# Patient Record
Sex: Female | Born: 1940 | Race: White | Hispanic: No | Marital: Married | Smoking: Never smoker
Health system: Southern US, Community
[De-identification: ages and names within clinical notes are randomized; demographics above are authoritative.]

## PROBLEM LIST (undated history)

## (undated) DIAGNOSIS — J45909 Unspecified asthma, uncomplicated: Secondary | ICD-10-CM

## (undated) DIAGNOSIS — I472 Ventricular tachycardia: Secondary | ICD-10-CM

## (undated) DIAGNOSIS — E039 Hypothyroidism, unspecified: Secondary | ICD-10-CM

## (undated) DIAGNOSIS — K219 Gastro-esophageal reflux disease without esophagitis: Secondary | ICD-10-CM

## (undated) DIAGNOSIS — I499 Cardiac arrhythmia, unspecified: Secondary | ICD-10-CM

## (undated) DIAGNOSIS — M2010 Hallux valgus (acquired), unspecified foot: Secondary | ICD-10-CM

## (undated) DIAGNOSIS — I255 Ischemic cardiomyopathy: Secondary | ICD-10-CM

## (undated) DIAGNOSIS — R918 Other nonspecific abnormal finding of lung field: Secondary | ICD-10-CM

## (undated) DIAGNOSIS — I5181 Takotsubo syndrome: Secondary | ICD-10-CM

## (undated) DIAGNOSIS — D649 Anemia, unspecified: Secondary | ICD-10-CM

## (undated) DIAGNOSIS — I219 Acute myocardial infarction, unspecified: Secondary | ICD-10-CM

## (undated) DIAGNOSIS — I10 Essential (primary) hypertension: Secondary | ICD-10-CM

## (undated) DIAGNOSIS — I201 Angina pectoris with documented spasm: Secondary | ICD-10-CM

## (undated) DIAGNOSIS — M1990 Unspecified osteoarthritis, unspecified site: Secondary | ICD-10-CM

## (undated) DIAGNOSIS — I82439 Acute embolism and thrombosis of unspecified popliteal vein: Secondary | ICD-10-CM

## (undated) DIAGNOSIS — I2510 Atherosclerotic heart disease of native coronary artery without angina pectoris: Secondary | ICD-10-CM

## (undated) DIAGNOSIS — E785 Hyperlipidemia, unspecified: Secondary | ICD-10-CM

## (undated) DIAGNOSIS — F32A Depression, unspecified: Secondary | ICD-10-CM

## (undated) DIAGNOSIS — M542 Cervicalgia: Secondary | ICD-10-CM

## (undated) DIAGNOSIS — I2699 Other pulmonary embolism without acute cor pulmonale: Secondary | ICD-10-CM

## (undated) DIAGNOSIS — F419 Anxiety disorder, unspecified: Secondary | ICD-10-CM

## (undated) DIAGNOSIS — R053 Chronic cough: Secondary | ICD-10-CM

## (undated) DIAGNOSIS — F329 Major depressive disorder, single episode, unspecified: Secondary | ICD-10-CM

## (undated) DIAGNOSIS — I4729 Other ventricular tachycardia: Secondary | ICD-10-CM

## (undated) DIAGNOSIS — C50919 Malignant neoplasm of unspecified site of unspecified female breast: Secondary | ICD-10-CM

## (undated) HISTORY — DX: Ventricular tachycardia: I47.2

## (undated) HISTORY — PX: TONSILLECTOMY: SUR1361

## (undated) HISTORY — DX: Hyperlipidemia, unspecified: E78.5

## (undated) HISTORY — DX: Other ventricular tachycardia: I47.29

## (undated) HISTORY — DX: Malignant neoplasm of unspecified site of unspecified female breast: C50.919

## (undated) HISTORY — PX: BREAST SURGERY: SHX581

## (undated) HISTORY — DX: Gastro-esophageal reflux disease without esophagitis: K21.9

## (undated) HISTORY — PX: JOINT REPLACEMENT: SHX530

## (undated) HISTORY — PX: KNEE ARTHROSCOPY: SUR90

## (undated) HISTORY — DX: Other nonspecific abnormal finding of lung field: R91.8

## (undated) HISTORY — PX: OTHER SURGICAL HISTORY: SHX169

## (undated) HISTORY — PX: MASTECTOMY: SHX3

## (undated) HISTORY — PX: TONSILLECTOMY: SHX5217

## (undated) HISTORY — DX: Takotsubo syndrome: I51.81

## (undated) HISTORY — DX: Angina pectoris with documented spasm: I20.1

## (undated) HISTORY — DX: Essential (primary) hypertension: I10

## (undated) HISTORY — DX: Atherosclerotic heart disease of native coronary artery without angina pectoris: I25.10

---

## 1963-02-20 HISTORY — PX: THYROIDECTOMY: SHX17

## 1970-02-19 HISTORY — PX: NASAL SINUS SURGERY: SHX719

## 1997-03-23 ENCOUNTER — Ambulatory Visit (HOSPITAL_COMMUNITY): Admission: RE | Admit: 1997-03-23 | Discharge: 1997-03-23 | Payer: Self-pay | Admitting: Obstetrics and Gynecology

## 1998-01-04 ENCOUNTER — Other Ambulatory Visit: Admission: RE | Admit: 1998-01-04 | Discharge: 1998-01-04 | Payer: Self-pay | Admitting: Obstetrics and Gynecology

## 1999-01-31 ENCOUNTER — Other Ambulatory Visit: Admission: RE | Admit: 1999-01-31 | Discharge: 1999-01-31 | Payer: Self-pay | Admitting: Obstetrics and Gynecology

## 1999-02-08 ENCOUNTER — Encounter: Payer: Self-pay | Admitting: Obstetrics and Gynecology

## 1999-02-08 ENCOUNTER — Encounter: Admission: RE | Admit: 1999-02-08 | Discharge: 1999-02-08 | Payer: Self-pay | Admitting: Obstetrics and Gynecology

## 2002-04-27 ENCOUNTER — Other Ambulatory Visit: Admission: RE | Admit: 2002-04-27 | Discharge: 2002-04-27 | Payer: Self-pay | Admitting: Obstetrics and Gynecology

## 2003-04-29 ENCOUNTER — Other Ambulatory Visit: Admission: RE | Admit: 2003-04-29 | Discharge: 2003-04-29 | Payer: Self-pay | Admitting: Obstetrics and Gynecology

## 2004-06-06 ENCOUNTER — Other Ambulatory Visit: Admission: RE | Admit: 2004-06-06 | Discharge: 2004-06-06 | Payer: Self-pay | Admitting: Obstetrics and Gynecology

## 2004-12-11 ENCOUNTER — Ambulatory Visit: Payer: Self-pay | Admitting: Pain Medicine

## 2005-01-03 ENCOUNTER — Ambulatory Visit: Payer: Self-pay | Admitting: Pain Medicine

## 2005-01-17 ENCOUNTER — Ambulatory Visit: Payer: Self-pay | Admitting: Pain Medicine

## 2005-05-21 ENCOUNTER — Inpatient Hospital Stay (HOSPITAL_COMMUNITY): Admission: EM | Admit: 2005-05-21 | Discharge: 2005-05-23 | Payer: Self-pay | Admitting: Cardiology

## 2005-05-21 ENCOUNTER — Emergency Department: Payer: Self-pay | Admitting: Emergency Medicine

## 2005-05-21 ENCOUNTER — Ambulatory Visit: Payer: Self-pay | Admitting: *Deleted

## 2005-05-31 ENCOUNTER — Ambulatory Visit: Payer: Self-pay | Admitting: Cardiology

## 2005-06-07 ENCOUNTER — Encounter: Admission: RE | Admit: 2005-06-07 | Discharge: 2005-06-07 | Payer: Self-pay | Admitting: Cardiology

## 2005-06-07 ENCOUNTER — Other Ambulatory Visit: Admission: RE | Admit: 2005-06-07 | Discharge: 2005-06-07 | Payer: Self-pay | Admitting: Obstetrics and Gynecology

## 2005-06-12 ENCOUNTER — Ambulatory Visit: Payer: Self-pay | Admitting: Internal Medicine

## 2005-06-13 ENCOUNTER — Inpatient Hospital Stay: Payer: Self-pay | Admitting: Internal Medicine

## 2005-06-15 ENCOUNTER — Other Ambulatory Visit: Payer: Self-pay

## 2005-06-21 ENCOUNTER — Ambulatory Visit: Payer: Self-pay | Admitting: Internal Medicine

## 2005-06-26 ENCOUNTER — Encounter: Payer: Self-pay | Admitting: Internal Medicine

## 2005-06-28 ENCOUNTER — Ambulatory Visit: Payer: Self-pay | Admitting: Cardiology

## 2005-07-20 ENCOUNTER — Encounter: Payer: Self-pay | Admitting: Internal Medicine

## 2005-08-06 ENCOUNTER — Ambulatory Visit: Payer: Self-pay | Admitting: Cardiology

## 2005-08-19 ENCOUNTER — Encounter: Payer: Self-pay | Admitting: Internal Medicine

## 2005-09-24 ENCOUNTER — Encounter: Payer: Self-pay | Admitting: Internal Medicine

## 2005-10-20 ENCOUNTER — Encounter: Payer: Self-pay | Admitting: Internal Medicine

## 2005-10-28 ENCOUNTER — Observation Stay: Payer: Self-pay | Admitting: Internal Medicine

## 2005-10-28 ENCOUNTER — Other Ambulatory Visit: Payer: Self-pay

## 2005-11-19 ENCOUNTER — Encounter: Payer: Self-pay | Admitting: Internal Medicine

## 2005-12-20 ENCOUNTER — Encounter: Payer: Self-pay | Admitting: Internal Medicine

## 2006-01-19 ENCOUNTER — Encounter: Payer: Self-pay | Admitting: Internal Medicine

## 2006-01-23 ENCOUNTER — Ambulatory Visit: Payer: Self-pay | Admitting: Cardiology

## 2006-02-28 ENCOUNTER — Encounter: Payer: Self-pay | Admitting: Internal Medicine

## 2006-03-12 ENCOUNTER — Emergency Department: Payer: Self-pay | Admitting: Emergency Medicine

## 2006-03-22 ENCOUNTER — Encounter: Payer: Self-pay | Admitting: Internal Medicine

## 2006-04-22 ENCOUNTER — Encounter: Payer: Self-pay | Admitting: Internal Medicine

## 2006-05-02 ENCOUNTER — Emergency Department: Payer: Self-pay | Admitting: Internal Medicine

## 2006-05-02 ENCOUNTER — Other Ambulatory Visit: Payer: Self-pay

## 2006-05-21 ENCOUNTER — Encounter: Payer: Self-pay | Admitting: Internal Medicine

## 2006-06-19 ENCOUNTER — Other Ambulatory Visit: Admission: RE | Admit: 2006-06-19 | Discharge: 2006-06-19 | Payer: Self-pay | Admitting: Obstetrics and Gynecology

## 2006-06-20 ENCOUNTER — Encounter: Payer: Self-pay | Admitting: Internal Medicine

## 2006-07-21 ENCOUNTER — Encounter: Payer: Self-pay | Admitting: Internal Medicine

## 2006-08-20 ENCOUNTER — Encounter: Payer: Self-pay | Admitting: Internal Medicine

## 2006-10-03 ENCOUNTER — Encounter: Payer: Self-pay | Admitting: Internal Medicine

## 2006-10-14 ENCOUNTER — Ambulatory Visit: Payer: Self-pay | Admitting: Unknown Physician Specialty

## 2006-10-21 ENCOUNTER — Encounter: Payer: Self-pay | Admitting: Internal Medicine

## 2006-11-18 ENCOUNTER — Ambulatory Visit: Payer: Self-pay | Admitting: Cardiology

## 2007-01-22 ENCOUNTER — Ambulatory Visit: Payer: Self-pay | Admitting: Cardiology

## 2007-02-20 ENCOUNTER — Emergency Department (HOSPITAL_COMMUNITY): Admission: EM | Admit: 2007-02-20 | Discharge: 2007-02-21 | Payer: Self-pay | Admitting: Emergency Medicine

## 2007-02-24 ENCOUNTER — Ambulatory Visit: Payer: Self-pay | Admitting: Cardiology

## 2007-05-06 ENCOUNTER — Encounter: Payer: Self-pay | Admitting: Critical Care Medicine

## 2007-06-24 ENCOUNTER — Other Ambulatory Visit: Admission: RE | Admit: 2007-06-24 | Discharge: 2007-06-24 | Payer: Self-pay | Admitting: Obstetrics and Gynecology

## 2007-09-03 ENCOUNTER — Ambulatory Visit: Payer: Self-pay

## 2007-09-11 ENCOUNTER — Ambulatory Visit: Payer: Self-pay | Admitting: Cardiology

## 2007-09-19 IMAGING — CR DG CHEST 1V PORT
1 series · 1 of 1 positions shown · non-contrast
Comparison: none

REASON FOR EXAM: Chest pain
COMMENTS:

PROCEDURE:     DXR - DXR PORTABLE CHEST SINGLE VIEW  - May 21, 2005  [DATE]
RESULT:     Single AP view reveals the heart to be normal in size. The lung
fields are clear. The vascularity is within normal limits. No effusions are
seen.

[view not recorded]
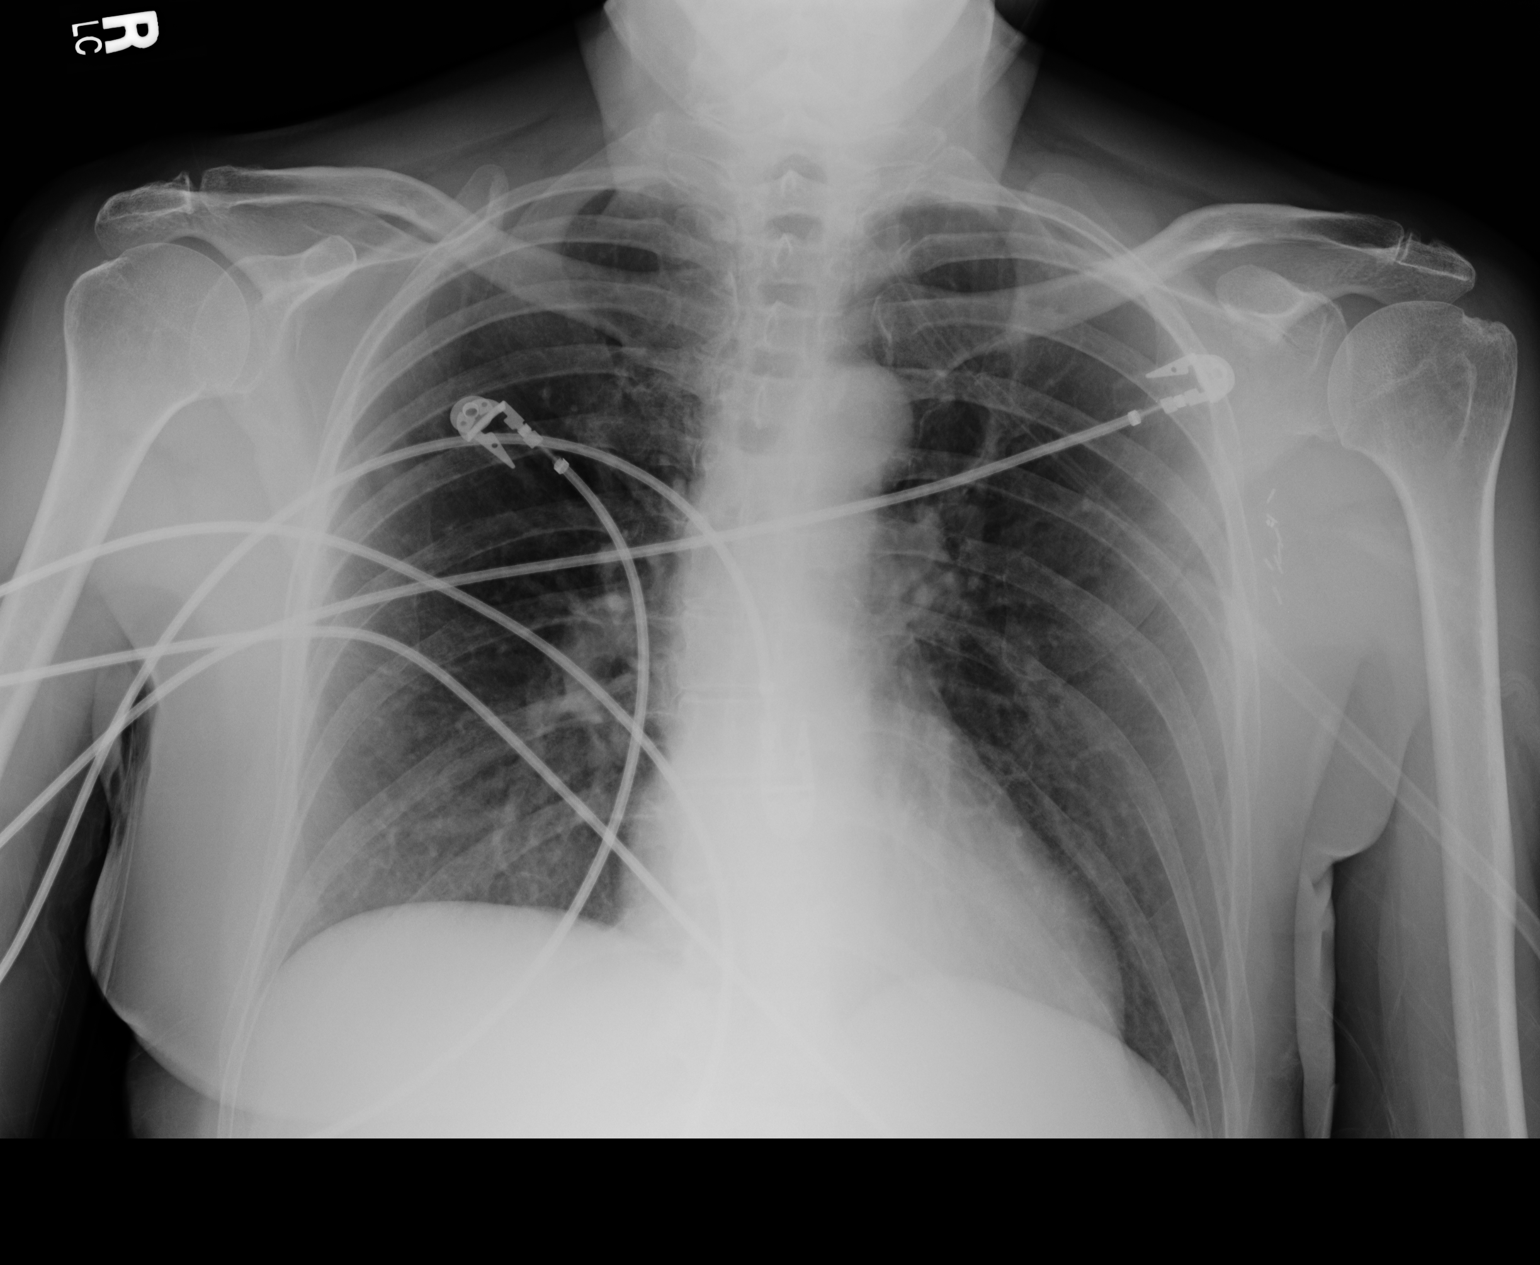

[1 of 1 positions shown; findings below may reference images not displayed]

IMPRESSION: The lung fields are clear.

## 2008-03-16 ENCOUNTER — Ambulatory Visit: Payer: Self-pay | Admitting: Cardiology

## 2008-04-06 ENCOUNTER — Ambulatory Visit: Payer: Self-pay | Admitting: Cardiology

## 2008-05-07 ENCOUNTER — Encounter: Payer: Self-pay | Admitting: Critical Care Medicine

## 2008-05-11 ENCOUNTER — Encounter: Payer: Self-pay | Admitting: Critical Care Medicine

## 2008-05-25 ENCOUNTER — Ambulatory Visit: Payer: Self-pay | Admitting: Cardiology

## 2008-06-07 ENCOUNTER — Ambulatory Visit: Payer: Self-pay | Admitting: Critical Care Medicine

## 2008-06-07 DIAGNOSIS — K219 Gastro-esophageal reflux disease without esophagitis: Secondary | ICD-10-CM

## 2008-06-07 DIAGNOSIS — I219 Acute myocardial infarction, unspecified: Secondary | ICD-10-CM | POA: Insufficient documentation

## 2008-06-07 DIAGNOSIS — C50919 Malignant neoplasm of unspecified site of unspecified female breast: Secondary | ICD-10-CM | POA: Insufficient documentation

## 2008-06-07 DIAGNOSIS — R0602 Shortness of breath: Secondary | ICD-10-CM

## 2008-06-07 DIAGNOSIS — J309 Allergic rhinitis, unspecified: Secondary | ICD-10-CM | POA: Insufficient documentation

## 2008-06-07 DIAGNOSIS — I10 Essential (primary) hypertension: Secondary | ICD-10-CM | POA: Insufficient documentation

## 2008-06-07 DIAGNOSIS — E785 Hyperlipidemia, unspecified: Secondary | ICD-10-CM

## 2008-06-07 LAB — CONVERTED CEMR LAB: IgE (Immunoglobulin E), Serum: 17.7 intl units/mL (ref 0.0–180.0)

## 2008-06-24 ENCOUNTER — Encounter: Payer: Self-pay | Admitting: Critical Care Medicine

## 2008-06-24 ENCOUNTER — Other Ambulatory Visit: Admission: RE | Admit: 2008-06-24 | Discharge: 2008-06-24 | Payer: Self-pay | Admitting: Obstetrics and Gynecology

## 2008-07-07 ENCOUNTER — Ambulatory Visit: Payer: Self-pay | Admitting: Cardiology

## 2008-07-07 ENCOUNTER — Encounter: Payer: Self-pay | Admitting: Cardiology

## 2008-07-15 ENCOUNTER — Ambulatory Visit: Payer: Self-pay | Admitting: Critical Care Medicine

## 2008-07-15 DIAGNOSIS — J45909 Unspecified asthma, uncomplicated: Secondary | ICD-10-CM | POA: Insufficient documentation

## 2008-07-16 LAB — CONVERTED CEMR LAB: IgE (Immunoglobulin E), Serum: 26 intl units/mL (ref 0.0–180.0)

## 2008-07-22 LAB — CONVERTED CEMR LAB
ALT: 15 units/L (ref 0–35)
AST: 15 units/L (ref 0–37)
Albumin: 4.3 g/dL (ref 3.5–5.2)
Alkaline Phosphatase: 136 units/L — ABNORMAL HIGH (ref 39–117)
Bilirubin, Direct: <0.1 mg/dL (ref 0.0–0.3)
Cholesterol: 205 mg/dL — ABNORMAL HIGH (ref 0–200)
HDL: 64 mg/dL (ref >39)
LDL Cholesterol: 98 mg/dL (ref 0–99)
Total Bilirubin: 0.3 mg/dL (ref 0.3–1.2)
Total CHOL/HDL Ratio: 3.2
Total Protein: 7.3 g/dL (ref 6.0–8.3)
Triglycerides: 216 mg/dL — ABNORMAL HIGH (ref <150)
VLDL: 43 mg/dL — ABNORMAL HIGH (ref 0–40)

## 2008-07-27 ENCOUNTER — Ambulatory Visit: Payer: Self-pay | Admitting: Cardiology

## 2008-09-15 ENCOUNTER — Ambulatory Visit: Payer: Self-pay | Admitting: Critical Care Medicine

## 2008-09-23 ENCOUNTER — Encounter: Payer: Self-pay | Admitting: Cardiology

## 2008-11-10 ENCOUNTER — Encounter (INDEPENDENT_AMBULATORY_CARE_PROVIDER_SITE_OTHER): Payer: Self-pay | Admitting: *Deleted

## 2008-11-17 ENCOUNTER — Ambulatory Visit: Payer: Self-pay | Admitting: Critical Care Medicine

## 2009-01-27 ENCOUNTER — Ambulatory Visit: Payer: Self-pay | Admitting: Cardiology

## 2009-03-09 ENCOUNTER — Telehealth: Payer: Self-pay | Admitting: Cardiology

## 2009-04-09 ENCOUNTER — Emergency Department: Payer: Self-pay | Admitting: Emergency Medicine

## 2009-09-21 ENCOUNTER — Ambulatory Visit: Payer: Self-pay | Admitting: Cardiology

## 2009-09-22 ENCOUNTER — Encounter: Payer: Self-pay | Admitting: Cardiology

## 2009-10-04 ENCOUNTER — Encounter: Payer: Self-pay | Admitting: Cardiology

## 2009-10-05 ENCOUNTER — Telehealth: Payer: Self-pay | Admitting: Cardiology

## 2010-03-23 NOTE — Letter (Signed)
[facility]: [facility]   Imported By: Marylou Mccoy 10/10/2009 11:41:50  _____________________________________________________________________  External Attachment:    Type:   Image     Comment:   External Document

## 2010-03-23 NOTE — Assessment & Plan Note (Signed)
Summary: F7M/AMD  Medications Added PRAVASTATIN SODIUM 20 MG TABS (PRAVASTATIN SODIUM) 1 tab at bedtime        Visit Type:  6 mo f/u Referring Provider:  Dr. Valera Castle Primary Provider:  Dr. Bethann Punches in [place]  CC:  no cardiac complaints today.  History of Present Illness: [name] comes in today for evaluation and management of her history of myocardial infarction. This was associated with chest discomfort and positive markers. Cardiac catheterization at that time in 2007 showed no significant plaque or dissection. We have attributed to either coronary spasm or minimal plaque.  She's had no chest discomfort or angina. She has some atypical chest pain that she's had since she's had her breast cancer. She is very active and has lost 19 pounds!  Her cholesterol was elevated at 228 recently with LDL 132. This was off any statin. She's had a hard time tolerating these. Dr. Hyacinth Meeker, her primary care, placed her back on pravastatin 20 minutes per day. She does have a high HDL.   Current Medications (verified): 1)  Caltrate 600+d Plus 600-400 Mg-Unit Tabs (Calcium Carbonate-Vit D-Min) .Marland Kitchen.. 1 By Mouth Two Times A Day 2)  Nexium 40 Mg Cpdr (Esomeprazole Magnesium) .Marland Kitchen.. 1 By Mouth Daily 3)  Synthroid 88 Mcg Tabs (Levothyroxine Sodium) .Marland Kitchen.. 1 By Mouth Daily 4)  Taztia Xt 300 Mg Xr24h-Cap (Diltiazem Hcl Er Beads) .Marland Kitchen.. 1 By Mouth Daily 5)  Bayer Aspirin Ec Low Dose 81 Mg Tbec (Aspirin) .Marland Kitchen.. 1 By Mouth Daily 6)  Fexofenadine Hcl 180 Mg Tabs (Fexofenadine Hcl) .Marland Kitchen.. 1 By Mouth Daily 7)  B-12 Tr 1000 Mcg Cr-Tabs (Cyanocobalamin) .Marland Kitchen.. 1 By Mouth Daily 8)  Sertraline Hcl 50 Mg Tabs (Sertraline Hcl) .Marland Kitchen.. 1 By Mouth Daily 9)  Magnesium Oxide 400 Mg Caps (Magnesium Oxide) .Marland Kitchen.. 1 By Mouth Daily 10)  Vitamin D 1000 Unit Tabs (Cholecalciferol) .Marland Kitchen.. 1 By Mouth Daily 11)  Docusate Sodium 100 Mg Tabs (Docusate Sodium) .... 2 By Mouth At Bedtime And1 Every Other Day 12)  Meloxicam 15 Mg Tabs  (Meloxicam) .Marland Kitchen.. 1 By Mouth At Bedtime 13)  Nasonex 50 Mcg/act Susp (Mometasone Furoate) .Marland Kitchen.. 1 Spray in Each Nostril Daily 14)  Meclizine Hcl 25 Mg Tabs (Meclizine Hcl) .... As Needed 15)  Diazepam 5 Mg Tabs (Diazepam) .... As Needed 16)  Nitroglycerin 0.4 Mg Subl (Nitroglycerin) .... One Tablet Under Tongue Every 5 Minutes As Needed For Chest Pain---May Repeat Times Three 17)  Pravastatin Sodium 20 Mg Tabs (Pravastatin Sodium) .Marland Kitchen.. 1 Tab At Bedtime  Allergies: 1)  ! Hydrocodone 2)  ! Morphine 3)  ! * Novacaine 4)  ! Codeine 5)  ! * Statins  Past History:  Past Medical History: Last updated: 07/24/2008 Allergic Rhinitis Myocardial Infarction    vasospasm,  no stents Breast Cancer     -s/p bilat mastectomy  1999, 2008 Pulmonary nodules    -no change CT chest 3/10 G E R D Hypertension Hyperlipidemia Nonobstructive CAD with underlying coronary spasm Hx of non-ST segment elevation MI Dyspnea on exertion Hx tachy palpitations  Past Surgical History: Last updated: 06/07/2008 Bilateral Mastectomies  1999, 2008 Sinus surgery 1972 thyroidectomy 1965 Tonsillectomy 1950s  Family History: Last updated: 06/07/2008 Family History MI/Heart Attack father, two brothers rheumatism mother brother with melanoma/leukemia stroke mother  Social History: Last updated: 06/07/2008 Patient never smoked.  Retired, widowed Engineer, site  Risk Factors: Smoking Status: never (09/15/2008)  Review of Systems       negative other history of present  illness  Vital Signs:  Patient profile:   70 year old female Height:      65 inches Weight:      151 pounds BMI:     25.22 Pulse rate:   62 / minute Pulse rhythm:   irregular BP sitting:   130 / 78  (left arm) Cuff size:   large  Vitals Entered By: Danielle Rankin, CMA (September 21, 2009 3:08 PM)  Physical Exam  General:  no acute distress, she obviously has lost some weight and it looks good. Head:  normocephalic and atraumatic Eyes:   PERRLA/EOM intact; conjunctiva and lids normal. Neck:  Neck supple, no JVD. No masses, thyromegaly or abnormal cervical nodes. Chest [facility]:  no deformities or breast masses noted Lungs:  Clear bilaterally to auscultation and percussion. Heart:  PMI not displaced, normal S1-S2 no gallop rub or murmur. Msk:  Back normal, normal gait. Muscle strength and tone normal. Pulses:  pulses normal in all 4 extremities Extremities:  No clubbing or cyanosis. Neurologic:  Alert and oriented x 3. Skin:  Intact without lesions or rashes. Psych:  Normal affect.   EKG  Procedure date:  09/21/2009  Findings:      normal sinus rhythm, nonspecific ST segment changes  Impression & Recommendations:  Problem # 1:  Hx of MYOCARDIAL INFARCTION (ICD-410.90) She most likely had coronary spasm as the cause of this in the past. I still feel she needs a statin, aspirin, and carry nitroglycerin. Hopefully she can tolerate pravastatin. Her updated medication list for this problem includes:    Taztia Xt 300 Mg Xr24h-cap (Diltiazem hcl er beads) .Marland Kitchen... 1 by mouth daily    Bayer Aspirin Ec Low Dose 81 Mg Tbec (Aspirin) .Marland Kitchen... 1 by mouth daily    Nitroglycerin 0.4 Mg Subl (Nitroglycerin) ..... One tablet under tongue every 5 minutes as needed for chest pain---may repeat times three  Orders: EKG w/ Interpretation (93000)  Problem # 2:  HYPERTENSION (ICD-401.9)  Her updated medication list for this problem includes:    Taztia Xt 300 Mg Xr24h-cap (Diltiazem hcl er beads) .Marland Kitchen... 1 by mouth daily    Bayer Aspirin Ec Low Dose 81 Mg Tbec (Aspirin) .Marland Kitchen... 1 by mouth daily  Orders: EKG w/ Interpretation (93000)  Patient Instructions: 1)  Your physician recommends that you schedule a follow-up appointment in: 1 year with Dr. Daleen Squibb 2)  Your physician recommends that you continue on your current medications as directed. Please refer to the Current Medication list given to you today.

## 2010-03-23 NOTE — Progress Notes (Signed)
Summary: discuss treatment - + stress test on yesterday   Phone Note From Other Clinic   Caller: amy from [facility] [phone] Request: Talk with Nurse Summary of Call: pt had second + stress test study with chestpain, rbbb . ekg was send last week. need to discuss treatment.  Initial call taken by: Lorne Skeens,  October 05, 2009 8:10 AM  Follow-up for Phone Call        I left a message for Amy to call the office back to discuss her concerns. Julieta Gutting, RN, BSN  October 05, 2009 8:19 AM  Per Amy calling back [phone] or [phone] Lorne Skeens  October 05, 2009 9:39 AM    I spoke with Alphonzo Lemmings and she and Amy have been speaking with Mylo Red RN for the past few weeks about this pt enrolling in a clinical research study.  For the clinical trial the pt has to perform two CPX tests.  The pt did fine on the first test but she did the second test yesterday and they got her pulse higher and the pt went into a rate induced BBB and developed CP. The pt's symptoms resolved before she left the research site. I asked Whitney to please fax the strips to our office for Dr Daleen Squibb to review and make further recommendations.   Follow-up by: Julieta Gutting, RN, BSN,  October 05, 2009 11:28 AM  Additional Follow-up for Phone Call Additional follow up Details #1::        Records were obtained from [facility].  Per records patient developed BBB and "chest pain at peak exercise. No shoulder discomfort at this time."  Heart rate was 142.   I will forward this to Dr. Lonia Chimera RN    Additional Follow-up for Phone Call Additional follow up Details #2::    need to look at strips. Follow-up by: Gaylord Shih, MD, [facility],  October 10, 2009 10:41 AM   Appended Document: discuss treatment - + stress test on yesterday She had a rate dep. LBBB no tx per Dr Daleen Squibb

## 2010-03-23 NOTE — Progress Notes (Signed)
Summary: PROBLEMS   Phone Note Call from Patient Call back at Home Phone [phone]   Caller: SELF Call For: Donnica Jarnagin Summary of Call: PREVASTATIN HAS BEEN BOTHERING THE PT FOR THE LAST FEW WEEKS-KNEES AND HIP ARE BOTHERING HER Initial call taken by: Harlon Flor,  March 09, 2009 1:38 PM  Follow-up for Phone Call        pt on pravastatin since April.  Left message to call. Melissa Howdeshell RN BSN  per pt- hands, hips and knees are starting to hurt.  instructed pt that it was uncommon for these symptoms to occur so long after starting medication.  instructed pt to hold medication for 5 days to see if pain subsides. pt will call back with results.  Follow-up by: Charlena Cross, RN, BSN,  March 10, 2009 3:03 PM     Appended Document: PROBLEMS since being off pravastatin for a week pt states that her pain is almost completely resolved. instructed pt that I would forward this information to Dr. Daleen Squibb for advice.  Charlena Cross RN BSN   Appended Document: PROBLEMS Chart needs to be labeled Statin intolerant. No other statin at this point.  Appended Document: PROBLEMS    Clinical Lists Changes  Allergies: Added new allergy or adverse reaction of * STATINS      Appended Document: PROBLEMS    Clinical Lists Changes  Medications: Removed medication of PRAVASTATIN SODIUM 20 MG TABS (PRAVASTATIN SODIUM) 1 by mouth at bedtime

## 2010-07-04 NOTE — Assessment & Plan Note (Signed)
[facility] OFFICE NOTE   [name], [name]                       MRN:          [id number]  DATE:09/11/2007                            DOB:          12/31/1940    [name] returns today for followup.  She has been having a lot of  problems with dizziness and vertigo.  She has also got a dry cough that  is nonproductive.  She denies any hemoptysis.  She has been seeing Dr.  Sheran Spine of ENT here in [place].  An MRA of the head and sinuses  apparently is clear.  She has been on meclizine, which helps.   She also complains of feeling palpitations when she lays flat on her  back, which is somewhat alleviated by lying on her sides.  She also  feels it at rest during the day.  These do not bother her during the day  while she is up and about.  She denies any presyncope or syncope.  She  has had no angina.  She still carries sublingual nitroglycerin for  possible coronary spasm and nonobstructive disease.   She came to the emergency room on February 20, 2007, for some chest pain  and had a chest CT.  This did not show a pulmonary embolus, but did show  a 2- x 7-cm right lung nodule.  Followup was suggested.  She was unaware  of this.  She also has some postoperative fluid in her right chest that  was a small amount.   MEDICATIONS:  Her cardiac meds are diltiazem 300 mg a day, aspirin 81 mg  a day, and Lipitor 10 mg nightly.  She also carries sublingual  nitroglycerin.  She is really not on any medications that would cause  palpitations.  I have reviewed all of these with her today.  She is on  some fexofenadine, but not the D component.   PHYSICAL EXAMINATION:  VITAL SIGNS:  Blood pressure is 112/74, pulse 80  and regular, and weight is 156.  GENERAL:  She is in no acute distress.  HEENT:  Unchanged.  NECK:  She has no JVD.  Carotids are full.  No bruits.  Thyroid is not  enlarged.  Trachea is midline.  LUNGS:   Clear except for a few crackles in the right distal base.  HEART:  Regular rate and rhythm.  No gallop.  ABDOMEN:  Soft.  Good bowel sounds.  No midline bruits.  EXTREMITIES:  No cyanosis, clubbing, or edema.  Pulses are intact.  NEURO:  Intact.   I suspect [name]'s palpitations are benign with a normal left  ventricular function and nonobstructive disease.  She is having no  angina.  I have just offered her reassurance on this and told her to  stay on diltiazem 300 mg a day.  In regard to the cough and the abnormal  CT, we will follow up her CT scan at [facility] at her convenience over the  next couple of weeks.   I have made no changes in medical program.  Assuming she is  stable, I  will see her back again in 6 months.     Thomas C. Daleen Squibb, MD, [facility]  Electronically Signed    TCW/MedQ  DD: 09/11/2007  DT: 09/12/2007  Job #: [id number]   cc:   [name]

## 2010-07-04 NOTE — Assessment & Plan Note (Signed)
[facility] OFFICE NOTE   [name], [name]                       MRN:          [id number]  DATE:02/24/2007                            DOB:          1940-08-09    [name] comes in today after having a visit to the emergency room  on New Year's Day for chest discomfort.   PROBLEM LIST:  1. Nonobstructive coronary artery disease with a history of coronary      spasm, status post non-Q-wave infarction.  2. Hyperlipidemia.  3. Status post right mastectomy December 8 at [facility] by Dr. Max Sane.   She was enjoying her family when she developed a burning in her chest.  Her family gave her a nitroglycerin and it felt better.  By the time EMS  arrived, her pressure had dropped a little bit.  They gave her another  nitroglycerin en route, which totally relieved her pain.  By the time  she got in the [facility] ER, she was pain-free.   Her evaluation there was remarkable for a normal blood work including  cardiac enzymes.  Chest x-ray, which showed no acute cardiopulmonary  disease, chest CT which showed no obvious large pulmonary emboli with  some right lower lobe atelectasis with a question of some small fluid  there.  She had an indeterminate right hepatic lobe lesion, which was  felt to probably be a hemangioma, but might need further evaluation with  her history of breast cancer.  There was a probable bone island in the  thoracic spine, with the radiologist commenting for attention on  followup imaging.  She also had 2 to 7 mm right lung nodules.  Followup  CT was recommended in 3 months.   Since discharge, she has had a little aching in the center of her chest,  which is constant.  It goes away with oxycodone.   MEDICATIONS:  Unchanged since her last visit.   CARDIAC MEDS:  1. Lipitor 10 mg a day.  2. Taztia XT 300 mg a day.  3. Aspirin 81 mg a day.  4. Sublingual nitroglycerin.   Also should  note that she is taking meloxicam 15 mg a day.   EXAM:  Blood pressure 120/68, pulse 78 and regular, weight 156, down a  pound.  She looks remarkably good today.  Respiratory rate is 18 and unlabored.  It does not hurt to take a deep  breath or cough.  HEENT:  Unchanged.  Carotid upstrokes are equal bilaterally without bruits.  There is no  JVD.  Thyroid is not enlarged.  LUNGS:  Reveal clear breath sounds bilaterally throughout without rub.  HEART:  Reveals a nondisplaced PMI.  She has a normal S1, S2.  ABDOMEN:  Soft with good bowel sounds.  EXTREMITIES:  No edema.  Pulses intact.  NEURO:  Intact.   EKG from January 1 was reviewed.  It shows no acute changes and no  significant change from before, except perhaps a slight improvement in  her nonspecific ST segment changes.   I had  a long talk with [name] and her husband today.  They are  worried a lot about these nodules in the lung, and also this hepatic  lesion.  She says she does not remember having any staging done prior to  her mastectomy.  She has an appointment with Dr. Barbaraann Cao on January  30.  I have made copies of the CT scan as well as her chest x-ray, and  would defer to his recommendations for any metastatic workup.  I tried  to reassure her that I do not think this is a big deal.  They are very  concerned.   I have made no change in her medical program.  She was told by the  emergency room that she had pleurisy, but I do not think she has  pleurisy.  I think she probably just had an episode of coronary spasm  from excitement that responded to nitroglycerin.  In addition, she has  some chronic chest wall pain probably from a surgery, which was relived  by oxycodone.   I will plan on seeing her back again in 6 months.     Thomas C. Daleen Squibb, MD, [facility]  Electronically Signed    TCW/MedQ  DD: 02/24/2007  DT: 02/24/2007  Job #: [id number]   cc:   Ivin Poot

## 2010-07-04 NOTE — Assessment & Plan Note (Signed)
[facility] OFFICE NOTE   [name], [name]                       MRN:          [id number]  DATE:11/18/2006                            DOB:          Jun 24, 1940    [name] returns today for further management of her history of  nonobstructive coronary disease, probable coronary spasm and a history  of a non-Q wave infarction.   Remarkably, she has not been having any difficulties really over the  last 9 months.  She has had 1 episode of chest pain that was pretty  atypical.   She remains on:  1. Diltiazem extended release 300 mg a day.  2. Aspirin 81 mg a day.  3. Lipitor 10 mg a day.   Her other medicines are outlined in the chart.   PHYSICAL EXAMINATION:  VITAL SIGNS:  Blood pressure is 116/70.  Her  pulse is 69 and regular.  Weight is 156.  HEENT:  Unchanged.  NECK:  Carotid upstrokes are equal bilaterally without bruit.  No JVD.  Thyroid is not enlarged.  Trachea is midline.  Neck is supple.  LUNGS:  Clear.  HEART:  Reveals a nondisplaced PMI.  She has a normal S1 S2.  ABDOMEN:  Soft.  Good bowel sounds.  EXTREMITIES:  Reveal no cyanosis, clubbing, or edema.  Pulses are  intact.  NEUROLOGIC:  Intact.   Her EKG shows a normal sinus rhythm with ST segment changes  inferolaterally.  These are slightly accentuated from before but really  no significant difference.   ASSESSMENT/PLAN:  [name] is stable from her cardiovascular issues.  I have reassured her and asked her to continue with her regular  exercise.  We will plan on seeing her back in a year.     Thomas C. Daleen Squibb, MD, [facility]  Electronically Signed    TCW/MedQ  DD: 11/18/2006  DT: 11/18/2006  Job #: [id number]   cc:   Bethann Punches, Dr.

## 2010-07-04 NOTE — Assessment & Plan Note (Signed)
[facility] OFFICE NOTE   Rachel, Stone                       MRN:          [id number]  DATE:03/16/2008                            DOB:          1940-03-23    [name] comes in today for followup.   PROBLEM LIST:  1. Nonobstructive coronary artery disease.  2. History of non-ST segment elevation MI.  3. Hyperlipidemia.  4. History of tachy palpitations.  5. Dyspnea on exertion.   Her biggest complaint is dyspnea on exertion today.  This happens  particularly when she climbs a flight of stairs as she picks up  something.   She admits to some dietary indiscretion and not walking as much as she  usually does.  Her weight is actually up from 156-165.  She denies any  orthopnea, PND, or peripheral edema.  She has had nothing that really  sounds like true angina.   She is due to go back to [facility] for followup of her breast  cancer and abnormal chest CT in March.  I have strongly encouraged to do  so.  I have a copy sent to [place].   MEDICATIONS:  Unchanged since her last visit.  Please refer to the  maintenance medication list.   PHYSICAL EXAMINATION:  VITAL SIGNS:  Her blood pressure today is 124/74,  pulse 74 and regular, weight is 165.  HEENT:  Skin is warm and dry.  She is in no acute distress.  The rest of  her HEENT is within normal limits.  NECK:  Carotid upstrokes were equal bilaterally without bruits, no JVD.  No thyromegaly.  Trachea is midline.  Neck is supple.  LUNGS:  Clear to  auscultation and percussion.  No rub, rhonchi, or rales, or decreased  breath sounds.  HEART:  Nondisplaced PMI, soft S1 and S2.  No gallop.  ABDOMEN:  Soft, good bowel sounds.  No midline bruit.  Organomegaly was  difficult to assess.  EXTREMITIES:  No cyanosis, clubbing, or edema.  Pulses are intact.  NEUROLOGIC:  Intact.  There is no sign of DVT.   Please note that, two CTs have been done in the past  for dyspnea on  exertion and shortness of breath, both negative for pulmonary emboli.   EKG shows normal sinus rhythm with ST-segment changes laterally which  are stable.   ASSESSMENT AND PLAN:  [name] dyspnea on exertion is most likely  multifactorial.  Some of this may be due to an abnormal chest x-ray and  CT with some scar tissue, some could be due to weight gain, some due to  deconditioning.  I do not think that she has intrinsic lung disease nor  increased coronary disease.   I have reassured her today and encouraged to followup at [facility] in March.  I have also encouraged to lose about 3-4 pounds a month for a total  about 10 pounds.  Also encouraged her to walk.  We reviewed the use of  nitroglycerin.   I will plan on seeing her back in 6 months.  Thomas C. Daleen Squibb, MD, [facility]  Electronically Signed    TCW/MedQ  DD: 03/16/2008  DT: 03/17/2008  Job #: [id number]   cc:   [name]

## 2010-07-04 NOTE — Assessment & Plan Note (Signed)
[facility] OFFICE NOTE   IMA, HAFNER                       MRN:          [id number]  DATE:01/22/2007                            DOB:          May 13, 1940    Ms. Partridge returns today for preoperative clearance for a right  mastectomy. This was going to be done next Monday, the 8th of December,  by Dr. Thora Lance. She has had previous breast cancer and has had  a left mastectomy.   She has had very little chest discomfort. She has had some shortness of  breath that Dr. Bethann Punches has evaluated with chest x-ray and CT scan.   She is status post a non-Q-wave infarction with non-obstructive coronary  disease with a history of coronary spasm. She has had a lot of  noncardiac chest pain in the past.   Her current cardiovascular medications are:  1. Lipitor 10 mg a day.  2. Cartia XT 300 mg a day.  3. Aspirin 81 mg a day, but stopped it for the surgery.  4. Magnesium 400 mg per day.   She needs a renewal on her sublingual nitroglycerin.   Her blood pressure today is 110/68, pulse 78 and regular. Weight 157.  HEENT: Normocephalic, atraumatic. PERRLA. Extra-ocular movements intact.  Sclerae are clear. Facial symmetry is normal. Dentition is satisfactory.  She wears glasses. Carotid upstrokes are equal bilaterally without  bruits. There is no JVD.  Thyroid is not enlarged. Trachea is midline.  LUNGS:  Are clear.  No rhonchi or wheezes.  HEART: Reveals a nondisplaced PMI. She has normal S1, S2.  ABDOMEN: Soft.  EXTREMITIES: Reveals no edema. Pulses are intact.  NEURO: Intact.   Electrocardiogram from [facility] on November 26, shows actually sinus  bradycardia with ST segment depression inferolaterally which is  unchanged from her previous ECGs. In fact, it looks a little bit better  than our last EKG.   ASSESSMENT/PLAN:  Ms. Fodor non-obstructive coronary disease and  history of coronary spasm  is stable. I have renewed her sublingual  nitroglycerin. I have written her a note clearing her for surgery at  [facility] next Monday. As described above, I think she is at low risk.   I will plan on seeing her back in six months.     Thomas C. Daleen Squibb, MD, [facility]  Electronically Signed    TCW/MedQ  DD: 01/22/2007  DT: 01/22/2007  Job #: [id number]   cc:   Thora Lance, MD  Bethann Punches

## 2010-07-04 NOTE — Assessment & Plan Note (Signed)
[address] OFFICE NOTE   [name], [name]                       MRN:          [id number]  DATE:07/27/2008                            DOB:          12/29/1940    [name] comes in today for close followup with her history of  Lipitor-induced knee arthralgias.   Off the Lipitor, which is only at 10 mg per day, her knee discomfort has  gotten better.  I have listed as an intolerance.   Because of her coronary artery disease and history of a non-STEMI, I  have placed her on pravastatin 20 mg nightly.  Her total cholesterol was  increased to 205, triglycerides are 216, VLDL was 43 suggesting dietary  carbohydrate noncompliance, HDL increased, however, from 42.4-64 with a  total cholesterol HDL ratio of 3.2, and her LDL still less than 100 at  98.  Her LFTs were normal.   I had a nice chat with her today.  I have told her that she needs to cut  out carbs, both sweet and non-sweet as much as possible.  She needs to  try to lose further weight.  Exercise will also help.   She is using flaxseed on her cereal every morning.  I think that may  have helped her HDL.   I have made no changes.  I do not think she will tolerate a higher dose  of pravastatin nor do I think it is necessary particularly with the  remarkable improvement in her HDL.   Repeat labs in 6 months.      Thomas C. Daleen Squibb, MD, [facility]  Electronically Signed    TCW/MedQ  DD: 07/27/2008  DT: 07/28/2008  Job #: [id number]   cc:   Bethann Punches

## 2010-07-04 NOTE — Assessment & Plan Note (Signed)
[facility] OFFICE NOTE   LORIJEAN, HUSSER                       MRN:          [id number]  DATE:09/11/2007                            DOB:          [birth year]    ADDENDUM   It turns out that Mr. Lenig says that Mrs. Selvy has had several  CTs at [facility] since the one in January 2009.  He was told they were  stable.  We will therefore cancel the CT scan that we had scheduled here  at [facility].     Thomas C. Daleen Squibb, MD, [facility]  Electronically Signed    TCW/MedQ  DD: 09/11/2007  DT: 09/12/2007  Job #: [id number]

## 2010-07-04 NOTE — Assessment & Plan Note (Signed)
[facility] OFFICE NOTE   NICHOLE, NEYER                       MRN:          [id number]  DATE:04/06/2008                            DOB:          17-Jul-1940    Ms. Miracle comes in today because of aching in both knees.  She  stopped her Lipitor which she was taking 10 mg nightly for  nonobstructive coronary disease.  Her knees are slowly improving.  She  has been off of it now a couple of weeks.   Her exam today shows no significant swelling of either knee.  There is  no point tenderness.  There is no sign of DVT or Baker's cyst.  Peripheral pulses are intact.  There is no edema.   I had a nice chat with Ms. Qian today.  I told her this certainly  could be related to Lipitor and she has already done the right thing by  stopping it.  We will see her back in 4 weeks.  If she is remarkably  improved or this is resolved, we will assume this was related to  Lipitor.  We then can try low dose pravastatin, it may be 10 mg to 20 mg  nightly.  If we cannot use a statin, she does have only nonobstructive  coronary artery disease, so this would not be a total handicap.  I will  see her back in 4 weeks to discuss further.     Thomas C. Daleen Squibb, MD, [facility]  Electronically Signed    TCW/MedQ  DD: 04/06/2008  DT: 04/07/2008  Job #: [id number]   cc:   Bethann Punches

## 2010-07-04 NOTE — Assessment & Plan Note (Signed)
[facility] OFFICE NOTE   PATRICIAANN, RABANAL                       MRN:          [id number]  DATE:05/25/2008                            DOB:          12-31-1940    Ms. Everman comes back in today for a followup.  Since stopping her  Lipitor, her knee pain has gotten a little bit better.  She is still  having some problems, however.   She has nonobstructive coronary artery disease with a underlying  coronary spasm.  Her lipids were remarkably good on Lipitor 10 with an  LDL of 68 and a HDL that was little bit low at 42.4.   She is going to try pravastatin.   PHYSICAL EXAMINATION:  VITAL SIGNS:  Blood pressure today is 103/67,  pulse is 80 and regular, and weight is 163.  The rest of exam is  unchanged.  EXTREMITIES:  There is no lower extremity edema.  There is no knee  effusions.   ASSESSMENT/PLAN:  Ms. Manganiello knee pain got significantly better off  Lipitor.  We will list this as an intolerance.  She is willing, however,  to try pravastatin at 20 mg a day.  We will check fasting lipids and  LFTs in 6 weeks.  I will see her back in 8 weeks.     Thomas C. Daleen Squibb, MD, [facility]  Electronically Signed    TCW/MedQ  DD: 05/25/2008  DT: 05/26/2008  Job #: [id number]   cc:   Bethann Punches

## 2010-07-07 NOTE — H&P (Signed)
NAMEMarland Kitchen  GLENOLA, WHEAT NO.:  [id number]   MEDICAL RECORD NO.:  [id number]          PATIENT TYPE:  INP   LOCATION:  2807                         FACILITY:  [facility]   PHYSICIAN:  Vida Roller, M.D.   DATE OF BIRTH:  January 15, 1941   DATE OF ADMISSION:  05/21/2005  DATE OF DISCHARGE:                                HISTORY & PHYSICAL   PRIMARY CARE PHYSICIAN:  Primary is Dr. Bethann Punches in [facility].   HISTORY OF PRESENT ILLNESS:  Mrs. Jaquith is a 70 year old white female who  presented to her primary care physician this morning after having  approximately 24 hours worth of discomfort in her chest.  She says that  initially it was very mild shoulder discomfort actually with some  periumbilical discomfort which had lasted over the course of Sunday.  The  periumbilical discomfort had been a problem for her waxing and waning over  the last 2 weeks, the shoulder discomfort waxed and waned over the last 24  hours and she presented to her primary physician.  He obtained an  electrocardiogram in his office which was markedly abnormal and very  different from her previous showing ST-segment depression in the anterior  leads and he referred her emergently to [facility].  When she presented to [facility] she was having  discomfort in her chest, her electrocardiogram continued to be abnormal and  the emergency room physician there referred her to [facility].  He  treated her with nitroglycerin, IV beta blocker and heparin as well as  aspirin and her pain resolved.  However, upon transport via [facility] from  [facility], she developed more shoulder discomfort.  She got 2 mg of  IV morphine with subsequent hypotension and idioventricular rhythm.  She was  given 0.5 mg of epinephrine and a 500 mL normal saline bolus.  She resolved  to normal sinus rhythm, her blood pressure remained in the 90s and she  was  emergently taken and redirected to the catheterization lab here at [facility].  In the catheterization lab, I evaluated her.  She was still having  discomfort in her anterior chest radiating to her left shoulder associated  with some mild shortness of breath, no diaphoresis.  She is a difficult  historian.  She is quite fearful and anxious and really is unable to give a  detailed history.  This was gleaned from various support personnel and  supplemented somewhat from her history.  She is currently in the  catheterization laboratory undergoing catheterization.   PAST MEDICAL HISTORY:  Her past medical history is significant for  hypertension; she is on hydrochlorothiazide.  She has unknown lipid status.  She has a history of thyroid goiter status post surgery.  None of this is  well documented in her available medical record.   FAMILY HISTORY:  Her family history is significant for multiple relatives  with coronary artery disease including a father who died of early coronary  disease and two siblings with bypass surgery.   SOCIAL HISTORY:  She does not  smoke, drink or use illicit drugs and never  has.   MEDICATIONS:  She is on hydrochlorothiazide and Synthroid as her only two  medications prior to admission.   PAST SURGICAL HISTORY:  Her past surgical history is significant for the  thyroid surgery in her neck.   ALLERGIES:  She is allergic to CODEINE and HYDROCODONE.   REVIEW OF SYSTEMS:  Her review of systems was not obtainable due to her  inability to give a detailed history.   STUDIES:  Her electrocardiogram shows sinus rhythm at a rate of 76 with  normal intervals and normal axes.  She has about 3 mm of ST-segment  depression which is downsloping in leads V3, V4, V5 and V6 with some changes  in the high lateral leads as well.  This is significantly worse from her  admission electrocardiogram.  It is significantly different from an old  electrocardiogram which was  obtained from her primary on May 14, 2005.  I  am uncertain as to why that electrocardiogram was done.  Her chest x-ray was  reviewed by me, there is no formal reading but she appears to have normal  cardiac silhouette with a normal aortic knob, she does not have any  significant infiltrates.   LABORATORY:  White blood cell count 7.4, H&H of 15 and 44, platelet count of  211, glucose of 130, BUN of 22, creatinine of 0.9.  Sodium 135, potassium  3.2, chloride 99, bicarb 27.  Her liver function studies appear to be normal  with the exception of a slightly elevated alkaline phosphatase.  Her first  of cardiac enzymes shows a CK of 45, MB of less than 0.5 and troponin I of  less than 0.1.  Her lipase is 140 which is within normal limits at their  lab.   ASSESSMENT AND PLAN:  We have a lady with unstable angina and acute ST-  segment depression who is being taken emergently to the catheterization  laboratory.  She has received aspirin and a 5000 unit bolus of heparin.  We  will need to do appropriate postcatheterization care, check a set of fasting  lipids and check thyroid function studies.  More details will be available  once the data set is complete.      Vida Roller, M.D.  Electronically Signed     JH/MEDQ  D:  05/21/2005  T:  05/21/2005  Job:  [id number]

## 2010-07-07 NOTE — Discharge Summary (Signed)
[name], GOLLA NO.:  [id number]   MEDICAL RECORD NO.:  [id number]          PATIENT TYPE:  INP   LOCATION:  3737                         FACILITY:  [facility]   PHYSICIAN:  Jesse Sans. Wall, M.D.   DATE OF BIRTH:  1940-09-03   DATE OF ADMISSION:  05/21/2005  DATE OF DISCHARGE:  05/23/2005                                 DISCHARGE SUMMARY   DISCHARGING PHYSICIAN:  Dr. Juanito Doom.   CARDIOLOGIST:  Dr. Juanito Doom.   PRIMARY CARE PHYSICIAN:  Dr. Bethann Punches at [facility].   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   PROCEDURE PERFORMED:  Left heart catheterization, selective coronary  angiography, selective left ventriculography, aortic root on Rachel 2, 2007.   ALLERGIES:  CODEINE and HYDROCODONE.   HISTORY OF PRESENT ILLNESS:  [name] is a 70 year old white female who  had presented to her primary care physician on Rachel 2, 2007 stating that  she had had approximately 24 hours worth of chest discomfort.  Her primary  care had performed an EKG which showed markedly abnormal EKG ST segment  depression in the anterior leads and the patient was sent to [facility].  When she got to [facility],  she was seen by the emergency room physicians and then referred to [facility]  Cardiology.  The patient was transported via [facility] from [facility].  She  got more shoulder discomfort and received morphine and had subsequent  hypotension and an idioventricular rhythm and was then given 0.5 mg of  epinephrine in a 500-mL normal saline bolus.   HOSPITAL COURSE:  She was taken directly to [facility].  The  patient was admitted straight to the cardiac cath lab, where Dr. Riley Kill did  a cardiac catheterization which showed minimal and distal inferior wall  hypokinesis, no aortic root dissection or aortic insufficiency.   Impression:  There was no critical CAD at that point.  Impression was a minimal non-Q-  wave MI with essentially normal coronaries; mechanism at that point was  unclear.  She will be treated for possible coronary spasm.  Dr. Daleen Squibb saw the  patient on Rachel 4, 2007.  D-dimer was obtained, which was normal.  Troponin  had a max of 0.68 and was now 0.22.  Her cholesterol was normal.  At that  point of course, she just had an acute MI.   PLAN:  The plan is to let her be discharged home today.   DISCHARGE MEDICATIONS:  1.  She will be started on enteric-coated aspirin 325 mg daily.  2.  Diltiazem XR 240 mg.  3.  She will be discharged on her hydrochlorothiazide.  4.  She will be started on Lipitor 10 mg nightly.   TOTAL DURATION OF DISCHARGE ENCOUNTER:  Total time was 40 minutes with MD  and NP time together.     ______________________________  Rachel Humphrey, NP      Jesse Sans. Wall, M.D.  Electronically Signed    AH/MEDQ  D:  05/23/2005  T:  05/24/2005  Job:  [id number]   cc:   Bethann Punches  Fax: [phone]   Jesse Sans. Wall, M.D.  [address]  [address]  [place]  [place] [address]

## 2010-07-07 NOTE — Cardiovascular Report (Signed)
NAMETAYLORE, HINDE NO.:  [id number]   MEDICAL RECORD NO.:  [id number]          PATIENT TYPE:  INP   LOCATION:  2807                         FACILITY:  [facility]   PHYSICIAN:  [name], M.D. [facility] OF BIRTH:  [birth year]   DATE OF PROCEDURE:  05/21/2005  DATE OF DISCHARGE:                              CARDIAC CATHETERIZATION   INDICATIONS:  [name] is a 70 year old woman who presents with chest  pain.  She had an abnormal treadmill recently.  She is being referred to Dr.  Jens Stone but developed chest discomfort at night with some abnormal EKGs.  Her serum potassium was low at 3.2.  The current study was done to assess  coronary anatomy emergently.  She was seen in consultation after arrival at  [facility] by Dr. Dorethea Stone who referred her into the cath lab.   PROCEDURES:  1.  Left heart catheterization.  2.  Selective coronary arteriography.  3.  Selective left ventriculography.  4.  Aortic root aortography.   DESCRIPTION OF PROCEDURE:  The patient was brought to the catheterization  laboratory, prepped and draped in the usual fashion.  We discussed the  procedure with her in some detail.  Through an anterior puncture, the right  femoral artery was easily entered.  A 6-French sheath was placed.  Following  this, we took views of the left and right coronary arteries in multiple  angiographic projections.  Central aortic and left ventricular pressures  were measured with a pigtail.  Ventriculography was performed in the RAO  projection.  Following this and following the pressure pullback, the  proximal root aortography was performed to rule out aortic dissection as  alternative cause of her chest discomfort.  She tolerated all this without  difficulty.  Her ACT was less than 170, and subsequent to this her femoral  sheath was removed in the catheterization laboratory by [name].   HEMODYNAMIC DATA:  1.  Central aortic pressure  127/68.  2.  Left atrial pressure 121/16.  3.  No gradient on pullback across the aortic valve.   ANGIOGRAPHIC DATA:  1.  Aortic root aortography reveals a normal-sized aortic root.  The great      vessels appear to exit appropriately.  There is no evidence of      dissection.  2.  Left ventriculogram demonstrates well-preserved overall systolic      function with ejection fraction in excess of 55%.  There is perhaps      minimal delay in the inferior distal inferior wall, but does that not      correspond any definite coronary abnormality.  3.  The right coronary artery is a fairly large-caliber vessel.  The right      coronary throughout is fairly smooth.  The PDA bifurcates distally.  No      evidence of high-grade stenosis was noted.  The posterolateral system      was really quite small.  4.  The left main is free of critical disease.  5.  Left anterior descending artery courses to the apex.  There is perhaps  30% narrowing in the proximal midportion of the LAD.  There are several      small diagonal branches which are free of critical disease, small to      modest size intermediate vessel that does not have a definite      abnormality.  6.  The circumflex consists of a very large multiply bifurcating distal      marginal branch which appears to be free of critical disease.   CONCLUSION:  1.  Well-preserved left ventricular function.  2.  No evidence of aortic dissection and normal sized aortic root.  3.  No evidence of high-grade coronary obstruction.   DISPOSITION:  1.  We will recheck the patient's potassium and replace the potassium      appropriately.  2.  Serial enzymes will be obtained.  3.  2-D echocardiogram will be obtained to look for other sources of chest      pain.  4.  D-dimer will be obtained.  5.  She will be kept in the hospital overnight and possibly through the next      day.      [name], M.D. [facility]  Electronically Signed      TDS/MEDQ  D:  05/21/2005  T:  05/21/2005  Job:  [id number]   cc:   [name], M.D.  [place], [place]   [name], M.D. [name]  [address]  [address]  [place]  [place] [address]   CV Laboratory   Patient's medical record

## 2010-07-07 NOTE — Assessment & Plan Note (Signed)
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   She has been doing well.  Her lipids were at goal, except for a low HDL  in May 2007 on Lipitor.   Her medications have not changed, except now she is on 81 mg of aspirin  a day.  She has also had sertraline 50 mg a day added, magnesium 400 mg  a day added.   Her blood pressure is 115/73, pulse is 80 and regular, weight 151 and  stable.  The rest of her exam is unchanged.   I am pleased with how [name] is doing.  She will see [place] back in  May of 2008.  She continues to follow along closely with Dr. Bethann Punches.     Thomas C. Daleen Squibb, MD, [facility]  Electronically Signed    TCW/MedQ  DD: 01/23/2006  DT: 01/23/2006  Job #: [id number]   cc:   Bethann Punches

## 2010-08-09 ENCOUNTER — Encounter: Payer: Self-pay | Admitting: Cardiovascular Disease

## 2010-09-04 ENCOUNTER — Encounter: Payer: Self-pay | Admitting: Cardiology

## 2010-10-06 ENCOUNTER — Ambulatory Visit (INDEPENDENT_AMBULATORY_CARE_PROVIDER_SITE_OTHER): Payer: Medicare Other | Admitting: Cardiology

## 2010-10-06 ENCOUNTER — Encounter: Payer: Self-pay | Admitting: Cardiology

## 2010-10-06 VITALS — BP 118/74 | HR 78 | Ht 65.0 in | Wt 157.0 lb

## 2010-10-06 DIAGNOSIS — I219 Acute myocardial infarction, unspecified: Secondary | ICD-10-CM

## 2010-10-06 DIAGNOSIS — I2510 Atherosclerotic heart disease of native coronary artery without angina pectoris: Secondary | ICD-10-CM

## 2010-10-06 DIAGNOSIS — E785 Hyperlipidemia, unspecified: Secondary | ICD-10-CM

## 2010-10-06 DIAGNOSIS — I10 Essential (primary) hypertension: Secondary | ICD-10-CM

## 2010-10-06 NOTE — Patient Instructions (Signed)
Your physician recommends that you schedule a follow-up appointment in: 1 year with Dr. Wall  

## 2010-10-06 NOTE — Progress Notes (Signed)
HPI [name] returns today for evaluation and management of her nonobstructive coronary disease, history of non-ST segment elevation MI, question of coronary spasm, hyperlipidemia, and palpitations. She did remarkably well with no angina or chest discomfort. Her palpitations have also been significantly better.  Laboratory data from Dr. Hyacinth Meeker reviewed. Her lipids are not at goal. When questioned, she is not consistently taking her Prevacid. She still thinks is causing some of her aches and pains.  EKG today shows normal sinus rhythm with nonspecific changes, no acute changes. Past Medical History  Diagnosis Date  . Allergic rhinitis   . MI (myocardial infarction)     vasospasm, no stents  . Breast cancer 1999, 2008    s/p bilat mastectomy  . Pulmonary nodules     no change CT chest 3/10  . GERD (gastroesophageal reflux disease)   . HTN (hypertension)   . Hyperlipidemia   . CAD (coronary artery disease)     nonobstractive' with underlying coronary spasm   . MI, acute, non ST segment elevation     hx  . Dyspnea     on exertion  . Palpitations     hx; tachy    Past Surgical History  Procedure Date  . Bilateral mastectomies 1999, 2008  . Nasal sinus surgery 1972  . Thyroidectomy 1965  . Tonsillectomy 1950s    Family History  Problem Relation Age of Onset  . Heart attack Father     and 2 brothers  . Melanoma Brother   . Stroke Mother     History   Social History  . Marital Status: Married    Spouse Name: N/A    Number of Children: N/A  . Years of Education: N/A   Occupational History  . Retired Engineer, site    Social History Main Topics  . Smoking status: Never Smoker   . Smokeless tobacco: Not on file  . Alcohol Use: Not on file  . Drug Use: Not on file  . Sexually Active: Not on file   Other Topics Concern  . Not on file   Social History Narrative   Dealer. Widowed.     Allergies  Allergen Reactions  . Codeine   .  Hydrocodone   . Morphine   . Procaine Hcl   . Statins     Current Outpatient Prescriptions  Medication Sig Dispense Refill  . aspirin 81 MG EC tablet Take 81 mg by mouth daily.        . Calcium Carbonate-Vitamin D (CALTRATE 600+D) 600-400 MG-UNIT per tablet Take 1 tablet by mouth 2 (two) times daily.        . Cholecalciferol (VITAMIN D) 1000 UNITS capsule Take 1,000 Units by mouth daily.        . Cyanocobalamin (B-12 TR) 1000 MCG TBCR Take 1 tablet by mouth daily.        . diazepam (VALIUM) 5 MG tablet Take 5 mg by mouth every 6 (six) hours as needed.        . diltiazem (TIAZAC) 300 MG 24 hr capsule Take 300 mg by mouth daily.        Marland Kitchen docusate sodium (COLACE) 100 MG capsule Take 100 mg by mouth 2 (two) times daily. Take 2 at bedtime and 1 every other day       . esomeprazole (NEXIUM) 40 MG capsule Take 40 mg by mouth daily before breakfast.        . fexofenadine (ALLEGRA) 180 MG tablet Take 180 mg by  mouth daily.        Marland Kitchen levothyroxine (SYNTHROID, LEVOTHROID) 88 MCG tablet Take 88 mcg by mouth daily.        . magnesium oxide (MAG-OX) 400 MG tablet Take 400 mg by mouth daily.        . meclizine (ANTIVERT) 25 MG tablet Take 25 mg by mouth as needed.        . meloxicam (MOBIC) 15 MG tablet Take 15 mg by mouth at bedtime.        . mometasone (NASONEX) 50 MCG/ACT nasal spray Place 1 spray into the nose daily.        . nitroGLYCERIN (NITROSTAT) 0.4 MG SL tablet Place 0.4 mg under the tongue every 5 (five) minutes as needed.        . sertraline (ZOLOFT) 50 MG tablet Take 50 mg by mouth daily.        . pravastatin (PRAVACHOL) 20 MG tablet Take 20 mg by mouth at bedtime.          ROS Negative other than HPI.   PE General Appearance: well developed, well nourished in no acute distress HEENT: symmetrical face, PERRLA, good dentition  Neck: no JVD, thyromegaly, or adenopathy, trachea midline Chest: symmetric without deformity Cardiac: PMI non-displaced, RRR, normal S1, S2, no gallop or  murmur Lung: clear to ausculation and percussion Vascular: all pulses full without bruits  Abdominal: nondistended, nontender, good bowel sounds, no HSM, no bruits Extremities: no cyanosis, clubbing or edema, no sign of DVT, no varicosities  Skin: normal color, no rashes Neuro: alert and oriented x 3, non-focal Pysch: normal affect Filed Vitals:   10/06/10 1401  BP: 118/74  Pulse: 78  Height: 5\' 5"  (1.651 m)  Weight: 157 lb (71.215 kg)    EKG  Labs and Studies Reviewed.   No results found for this basename: WBC, HGB, HCT, MCV, PLT      Chemistry   No results found for this basename: NA, K, CL, CO2, BUN, CREATININE, GLU      Component Value Date/Time   ALKPHOS 136* 07/07/2008 2226   AST 15 07/07/2008 2226   ALT 15 07/07/2008 2226   BILITOT 0.3 07/07/2008 2226       Lab Results  Component Value Date   CHOL 205* 07/07/2008   Lab Results  Component Value Date   HDL 64 07/07/2008   Lab Results  Component Value Date   LDLCALC 98 07/07/2008   Lab Results  Component Value Date   TRIG 216* 07/07/2008   Lab Results  Component Value Date   CHOLHDL 3.2 Ratio 07/07/2008   No results found for this basename: HGBA1C   Lab Results  Component Value Date   ALT 15 07/07/2008   AST 15 07/07/2008   ALKPHOS 136* 07/07/2008   BILITOT 0.3 07/07/2008   No results found for this basename: TSH

## 2010-10-06 NOTE — Assessment & Plan Note (Signed)
Improved.  No change in treatment 

## 2010-10-06 NOTE — Assessment & Plan Note (Signed)
Restart pravastatin.  

## 2010-10-09 ENCOUNTER — Encounter: Payer: Self-pay | Admitting: Cardiology

## 2010-11-09 LAB — DIFFERENTIAL
Basophils Absolute: 0
Basophils Relative: 0
Eosinophils Absolute: 0.1
Eosinophils Relative: 1
Lymphocytes Relative: 11 — ABNORMAL LOW
Lymphs Abs: 1
Monocytes Absolute: 0.7
Monocytes Relative: 7
Neutro Abs: 7.8 — ABNORMAL HIGH
Neutrophils Relative %: 81 — ABNORMAL HIGH

## 2010-11-09 LAB — BASIC METABOLIC PANEL
Calcium: 9
Creatinine, Ser: 0.83
GFR calc Af Amer: >60
Sodium: 141

## 2010-11-09 LAB — POCT CARDIAC MARKERS
CKMB, poc: <1 — ABNORMAL LOW
Myoglobin, poc: 74.9
Operator id: 151321
Troponin i, poc: <0.05

## 2010-11-09 LAB — CBC
RBC: 4.33
WBC: 9.7

## 2010-11-09 LAB — PROTIME-INR
INR: 0.9
Prothrombin Time: 12.7

## 2010-11-09 LAB — APTT: aPTT: 26

## 2010-11-21 ENCOUNTER — Other Ambulatory Visit: Payer: Self-pay | Admitting: Specialist

## 2010-11-21 DIAGNOSIS — M25561 Pain in right knee: Secondary | ICD-10-CM

## 2010-11-29 ENCOUNTER — Ambulatory Visit
Admission: RE | Admit: 2010-11-29 | Discharge: 2010-11-29 | Disposition: A | Discharge status: Home / Self Care | Payer: Medicare Other | Source: Ambulatory Visit | Attending: Specialist | Admitting: Specialist

## 2010-11-29 DIAGNOSIS — M25561 Pain in right knee: Secondary | ICD-10-CM

## 2011-01-18 ENCOUNTER — Telehealth: Payer: Self-pay | Admitting: Cardiology

## 2011-01-18 NOTE — Telephone Encounter (Signed)
New problem:  Patient drop off medical clearance on 11/21. Office checking on clearance

## 2011-01-19 NOTE — Telephone Encounter (Signed)
Have not been able to locate the paperwork however Dr Daleen Squibb and/or Mylo Red, RN may have it.  They are not here today.  Paperwork for clearance will be faxed to the office today and placed on Dr Vern Claude cart for review next week.  Clydie Braun is aware.

## 2011-01-22 NOTE — Telephone Encounter (Signed)
Left message for [name] and spoke with pts wife, the paperwork is here and on dr wall's cart. He is back in the office tomorrow and will be able to look at. Pt wife a little upset, states it has been two weeks waiting on this paperwork, ask the pt to call tomorrow and speak with debbie gray dr wall's nurse Deliah Goody

## 2011-01-22 NOTE — Telephone Encounter (Signed)
Follow up/  Pt calling and wanting to know if this has been completed for her surgical clearance.  Patient is in a lot of pain and Dr. Thomasena Edis is waiting on this form before he proceeds with her surgery.    Please call and advise patient status/  [phone]

## 2011-01-23 ENCOUNTER — Telehealth: Payer: Self-pay | Admitting: Cardiology

## 2011-01-23 NOTE — Telephone Encounter (Signed)
Fu call Pt is calling you back She needs to know something today

## 2011-01-23 NOTE — Telephone Encounter (Signed)
Pt aware clearance was faxed x2. Mylo Red RN

## 2011-01-23 NOTE — Telephone Encounter (Signed)
Clearance was faxed on 01/17/11 but not received at [facility]. refaxed today. Clydie Braun received fax and will call patient. Mylo Red RN

## 2011-01-23 NOTE — Telephone Encounter (Signed)
New problem Pt was calling about surgical clearance paperwork. Pt is having knee surgery Please call her back asap

## 2011-02-05 ENCOUNTER — Telehealth: Payer: Self-pay | Admitting: Cardiology

## 2011-02-05 NOTE — Telephone Encounter (Signed)
LOV,12,faxed to [facility] @ [facility] [phone] 02/05/11/km

## 2011-04-20 HISTORY — PX: CARDIAC CATHETERIZATION: SHX172

## 2011-05-09 ENCOUNTER — Encounter: Payer: Self-pay | Admitting: *Deleted

## 2011-05-15 ENCOUNTER — Ambulatory Visit (INDEPENDENT_AMBULATORY_CARE_PROVIDER_SITE_OTHER): Payer: Medicare Other | Admitting: Cardiology

## 2011-05-15 ENCOUNTER — Encounter: Payer: Self-pay | Admitting: Cardiology

## 2011-05-15 ENCOUNTER — Telehealth: Payer: Self-pay | Admitting: Cardiology

## 2011-05-15 ENCOUNTER — Other Ambulatory Visit: Payer: Self-pay | Admitting: Cardiology

## 2011-05-15 VITALS — BP 101/62 | HR 70 | Resp 18 | Ht 65.0 in | Wt 159.4 lb

## 2011-05-15 DIAGNOSIS — I5181 Takotsubo syndrome: Secondary | ICD-10-CM

## 2011-05-15 NOTE — Progress Notes (Signed)
HPI [name] comes in today for evaluation and management of a recent anterior and anterior apical myocardial infarction.    On the morning of March 2, she found out that her niece died. In route to [facility], she developed substernal chest pain. Showed left bundle branch block. Emergent catheterization showed patent arteries with anterior, apical, inferior and lateral akinesia. Ejection fraction was 34%. She had mild mitral regurgitation.  Troponin peaked in the threes.  She has a history of coronary spasm with a very small MI in the past.  Discharge meds review as well as all discharge records including catheterization report.  She's had no further chest discomfort.  Past Medical History  Diagnosis Date  . Allergic rhinitis   . MI (myocardial infarction)     vasospasm, no stents  . Breast cancer 1999, 2008    s/p bilat mastectomy  . Pulmonary nodules     no change CT chest 3/10  . GERD (gastroesophageal reflux disease)   . HTN (hypertension)   . Hyperlipidemia   . CAD (coronary artery disease)     nonobstractive' with underlying coronary spasm   . MI, acute, non ST segment elevation     hx  . Dyspnea     on exertion  . Palpitations     hx; tachy    Current Outpatient Prescriptions  Medication Sig Dispense Refill  . aspirin 81 MG EC tablet Take 81 mg by mouth daily.        . Calcium Carbonate-Vitamin D (CALTRATE 600+D) 600-400 MG-UNIT per tablet Take 1 tablet by mouth 2 (two) times daily.        . Cholecalciferol (VITAMIN D) 1000 UNITS capsule Take 1,000 Units by mouth daily.        . Cyanocobalamin (B-12 TR) 1000 MCG TBCR Take 1 tablet by mouth daily.        . diazepam (VALIUM) 5 MG tablet Take 5 mg by mouth every 6 (six) hours as needed.        . diltiazem (TIAZAC) 300 MG 24 hr capsule Take 300 mg by mouth daily.        Marland Kitchen docusate sodium (COLACE) 100 MG capsule Take 100 mg by mouth 2 (two) times daily. Take 2 at bedtime and 1 every other day       . esomeprazole  (NEXIUM) 40 MG capsule Take 40 mg by mouth daily before breakfast.        . fexofenadine (ALLEGRA) 180 MG tablet Take 180 mg by mouth daily.        Marland Kitchen levothyroxine (SYNTHROID, LEVOTHROID) 88 MCG tablet Take 88 mcg by mouth daily.        . magnesium oxide (MAG-OX) 400 MG tablet Take 400 mg by mouth daily.        . meclizine (ANTIVERT) 25 MG tablet Take 25 mg by mouth as needed.        . meloxicam (MOBIC) 15 MG tablet Take 15 mg by mouth at bedtime.        . mometasone (NASONEX) 50 MCG/ACT nasal spray Place 1 spray into the nose daily.        . nitroGLYCERIN (NITROSTAT) 0.4 MG SL tablet Place 0.4 mg under the tongue every 5 (five) minutes as needed.        . pravastatin (PRAVACHOL) 20 MG tablet Take 20 mg by mouth at bedtime.        . sertraline (ZOLOFT) 50 MG tablet Take 50 mg by mouth daily.  Allergies  Allergen Reactions  . Codeine   . Hydrocodone   . Morphine   . Procaine Hcl   . Statins     Family History  Problem Relation Age of Onset  . Heart attack Father   . Melanoma Brother   . Stroke Mother   . Heart attack Brother   . Heart attack Brother   . Leukemia Brother   . Other Mother     rheumatism    History   Social History  . Marital Status: Married    Spouse Name: N/A    Number of Children: N/A  . Years of Education: N/A   Occupational History  . Retired Engineer, site    Social History Main Topics  . Smoking status: Never Smoker   . Smokeless tobacco: Never Used  . Alcohol Use: Not on file  . Drug Use: Not on file  . Sexually Active: Not on file   Other Topics Concern  . Not on file   Social History Narrative   Dealer. Widowed.     ROS ALL NEGATIVE EXCEPT THOSE NOTED IN HPI  PE  General Appearance: well developed, well nourished in no acute distress HEENT: symmetrical face, PERRLA, good dentition  Neck: no JVD, thyromegaly, or adenopathy, trachea midline Chest: symmetric without deformity Cardiac: PMI non-displaced, RRR,  normal S1, S2, no gallop or murmur Lung: clear to ausculation and percussion Vascular: all pulses full without bruits  Abdominal: nondistended, nontender, good bowel sounds, no HSM, no bruits Extremities: no cyanosis, clubbing or edema, no sign of DVT, no varicosities  Skin: normal color, no rashes Neuro: alert and oriented x 3, non-focal Pysch: normal affect  EKG Not repeated today. BMET    Component Value Date/Time   NA 141 02/20/2007 2110   K 3.9 02/20/2007 2110   CL 107 02/20/2007 2110   CO2 25 02/20/2007 2110   GLUCOSE 139* 02/20/2007 2110   BUN 16 02/20/2007 2110   CREATININE 0.83 02/20/2007 2110   CALCIUM 9.0 02/20/2007 2110   GFRNONAA >60 02/20/2007 2110   GFRAA  Value: >60        The eGFR has been calculated using the MDRD equation. This calculation has not been validated in all clinical situations. eGFR's persistently <60 mL/min signify possible Chronic Kidney Disease. 02/20/2007 2110    Lipid Panel     Component Value Date/Time   CHOL 205* 07/07/2008 2226   TRIG 216* 07/07/2008 2226   HDL 64 07/07/2008 2226   CHOLHDL 3.2 Ratio 07/07/2008 2226   VLDL 43* 07/07/2008 2226   LDLCALC 98 07/07/2008 2226    CBC    Component Value Date/Time   WBC 9.7 02/20/2007 2110   RBC 4.33 02/20/2007 2110   HGB 12.0 02/20/2007 2110   HCT 35.8* 02/20/2007 2110   PLT 284 02/20/2007 2110   MCV 82.6 02/20/2007 2110   MCHC 33.6 02/20/2007 2110   RDW 13.5 02/20/2007 2110   LYMPHSABS 1.0 02/20/2007 2110   MONOABS 0.7 02/20/2007 2110   EOSABS 0.1 02/20/2007 2110   BASOSABS 0.0 02/20/2007 2110

## 2011-05-15 NOTE — Assessment & Plan Note (Signed)
She is doing well and will most likely recover all of her LV systolic function. We'll arrange an echocardiogram 1 June. She's advised to carry nitroglycerin at all times. Her family is also been advised to try to avoid sudden shock or stress. Obviously, this is very hard to do. I will see her back in the office after the echocardiogram.

## 2011-05-15 NOTE — Telephone Encounter (Signed)
Reviewed pt's meds with Dr. Daleen Squibb, no changes.

## 2011-05-15 NOTE — Telephone Encounter (Signed)
New Problem:     Patient's husband called in wanting to know if there was any medication changes to his wife's regimen.  Says that Dr. Daleen Squibb left her appointment today to check and never returned. Please call back.

## 2011-05-15 NOTE — Patient Instructions (Addendum)
Your physician has requested that you have an echocardiogram the first week of June 2013. Echocardiography is a painless test that uses sound waves to create images of your heart. It provides your doctor with information about the size and shape of your heart and how well your heart's chambers and valves are working. This procedure takes approximately one hour. There are no restrictions for this procedure.  Your physician wants you to follow-up in: 4 months. You will receive a reminder letter in the mail two months in advance. If you don't receive a letter, please call our office to schedule the follow-up appointment.

## 2011-07-30 ENCOUNTER — Ambulatory Visit (HOSPITAL_COMMUNITY): Payer: Medicare Other | Attending: Cardiology | Admitting: Radiology

## 2011-07-30 DIAGNOSIS — I252 Old myocardial infarction: Secondary | ICD-10-CM | POA: Insufficient documentation

## 2011-07-30 DIAGNOSIS — I517 Cardiomegaly: Secondary | ICD-10-CM | POA: Insufficient documentation

## 2011-07-30 DIAGNOSIS — D580 Hereditary spherocytosis: Secondary | ICD-10-CM | POA: Insufficient documentation

## 2011-07-30 DIAGNOSIS — I059 Rheumatic mitral valve disease, unspecified: Secondary | ICD-10-CM | POA: Insufficient documentation

## 2011-07-30 DIAGNOSIS — E785 Hyperlipidemia, unspecified: Secondary | ICD-10-CM | POA: Insufficient documentation

## 2011-07-30 DIAGNOSIS — I5181 Takotsubo syndrome: Secondary | ICD-10-CM | POA: Insufficient documentation

## 2011-07-30 DIAGNOSIS — C50919 Malignant neoplasm of unspecified site of unspecified female breast: Secondary | ICD-10-CM | POA: Insufficient documentation

## 2011-07-30 DIAGNOSIS — I10 Essential (primary) hypertension: Secondary | ICD-10-CM | POA: Insufficient documentation

## 2011-07-30 NOTE — Progress Notes (Signed)
Echocardiogram performed.  

## 2011-08-08 ENCOUNTER — Ambulatory Visit (INDEPENDENT_AMBULATORY_CARE_PROVIDER_SITE_OTHER): Payer: Medicare Other | Admitting: Cardiology

## 2011-08-08 ENCOUNTER — Encounter: Payer: Self-pay | Admitting: Cardiology

## 2011-08-08 VITALS — BP 118/74 | HR 66 | Ht 65.0 in | Wt 159.0 lb

## 2011-08-08 DIAGNOSIS — I2510 Atherosclerotic heart disease of native coronary artery without angina pectoris: Secondary | ICD-10-CM

## 2011-08-08 DIAGNOSIS — I5181 Takotsubo syndrome: Secondary | ICD-10-CM

## 2011-08-08 DIAGNOSIS — E785 Hyperlipidemia, unspecified: Secondary | ICD-10-CM

## 2011-08-08 DIAGNOSIS — I219 Acute myocardial infarction, unspecified: Secondary | ICD-10-CM

## 2011-08-08 MED ORDER — CARVEDILOL 6.25 MG PO TABS: 6.2500 mg | ORAL_TABLET | Freq: Two times a day (BID) | ORAL | Status: DC

## 2011-08-08 MED ORDER — DILTIAZEM HCL ER BEADS 300 MG PO CP24: 300.0000 mg | ORAL_CAPSULE | Freq: Every day | ORAL | Status: AC

## 2011-08-08 NOTE — Patient Instructions (Addendum)
Continue Coreg 6.25mg  daily for 1 week then stop.  Start Tiazac 300mg  daily.  Your physician wants you to follow-up in: 6 months with Dr. Daleen Squibb.  You will receive a reminder letter in the mail two months in advance. If you don't receive a letter, please call our office to schedule the follow-up appointment.

## 2011-08-08 NOTE — Assessment & Plan Note (Signed)
Echocardiogram shows complete recovery of LV function with no segmental Alexandre Faries motion amount. Per her request, and 4 palpitations, will put her back on diltiazem at her previous dose and taper her carvedilol. We will see her back again in 6 months.

## 2011-08-08 NOTE — Progress Notes (Signed)
HPI [name] returns today for the evaluation and management of her history of Takosubos cardiomyopathy. Repeat echocardiogram this past week showed complete recovery of LV function with no Aniqua [name] motion abnormality. We are delighted.  She's having no chest pain or angina. She is having some palpitations and like to go back on her diltiazem. He is currently on caevedilol  6.25 mg by mouth twice a day.  Past Medical History  Diagnosis Date  . Allergic rhinitis   . MI (myocardial infarction)     vasospasm, no stents  . Breast cancer 1999, 2008    s/p bilat mastectomy  . Pulmonary nodules     no change CT chest 3/10  . GERD (gastroesophageal reflux disease)   . HTN (hypertension)   . Hyperlipidemia   . CAD (coronary artery disease)     nonobstractive' with underlying coronary spasm   . MI, acute, non ST segment elevation     hx  . Dyspnea     on exertion  . Palpitations     hx; tachy    Current Outpatient Prescriptions  Medication Sig Dispense Refill  . aspirin 81 MG EC tablet Take 81 mg by mouth daily.        . Calcium Carbonate-Vitamin D (CALTRATE 600+D) 600-400 MG-UNIT per tablet Take 1 tablet by mouth 2 (two) times daily.        . Cholecalciferol (VITAMIN D) 1000 UNITS capsule Take 1,000 Units by mouth daily.        . Cyanocobalamin (B-12 TR) 1000 MCG TBCR Take 1 tablet by mouth daily.        . diazepam (VALIUM) 5 MG tablet Take 5 mg by mouth every 6 (six) hours as needed.        . docusate sodium (COLACE) 100 MG capsule Take 100 mg by mouth 2 (two) times daily. Take 2 at bedtime and 1 every other day       . esomeprazole (NEXIUM) 40 MG capsule Take 40 mg by mouth daily before breakfast.        . fexofenadine (ALLEGRA) 180 MG tablet Take 180 mg by mouth daily.        Marland Kitchen levothyroxine (SYNTHROID, LEVOTHROID) 88 MCG tablet Take 88 mcg by mouth daily.        . magnesium oxide (MAG-OX) 400 MG tablet Take 400 mg by mouth daily.        . meclizine (ANTIVERT) 25 MG tablet Take 25  mg by mouth as needed.        . meloxicam (MOBIC) 15 MG tablet Take 15 mg by mouth at bedtime.        . mometasone (NASONEX) 50 MCG/ACT nasal spray Place 1 spray into the nose daily.        Marland Kitchen NITROSTAT 0.4 MG SL tablet ONE TABLET UNDER TONGUE EVERY 5 MINUTES AS NEEDED FOR CHEST PAIN---MAY REPEAT TIMES THREE  25 tablet  0  . pravastatin (PRAVACHOL) 20 MG tablet Take 20 mg by mouth at bedtime.        . sertraline (ZOLOFT) 50 MG tablet Take 50 mg by mouth daily.          Allergies  Allergen Reactions  . Codeine   . Hydrocodone   . Morphine   . Procaine Hcl   . Statins     Family History  Problem Relation Age of Onset  . Heart attack Father   . Melanoma Brother   . Stroke Mother   . Heart attack Brother   .  Heart attack Brother   . Leukemia Brother   . Other Mother     rheumatism    History   Social History  . Marital Status: Married    Spouse Name: N/A    Number of Children: N/A  . Years of Education: N/A   Occupational History  . Retired Engineer, site    Social History Main Topics  . Smoking status: Never Smoker   . Smokeless tobacco: Never Used  . Alcohol Use: Not on file  . Drug Use: Not on file  . Sexually Active: Not on file   Other Topics Concern  . Not on file   Social History Narrative   Dealer. Widowed.     ROS ALL NEGATIVE EXCEPT THOSE NOTED IN HPI  PE  General Appearance: well developed, well nourished in no acute distress HEENT: symmetrical face, PERRLA, good dentition  Neck: no JVD, thyromegaly, or adenopathy, trachea midline Chest: symmetric without deformity Cardiac: PMI non-displaced, RRR, normal S1, S2, no gallop or murmur Lung: clear to ausculation and percussion Vascular: all pulses full without bruits  Abdominal: nondistended, nontender, good bowel sounds, no HSM, no bruits Extremities: no cyanosis, clubbing or edema, no sign of DVT, no varicosities  Skin: normal color, no rashes Neuro: alert and oriented x 3,  non-focal Pysch: normal affect  EKG  BMET    Component Value Date/Time   NA 141 02/20/2007 2110   K 3.9 02/20/2007 2110   CL 107 02/20/2007 2110   CO2 25 02/20/2007 2110   GLUCOSE 139* 02/20/2007 2110   BUN 16 02/20/2007 2110   CREATININE 0.83 02/20/2007 2110   CALCIUM 9.0 02/20/2007 2110   GFRNONAA >60 02/20/2007 2110   GFRAA  Value: >60        The eGFR has been calculated using the MDRD equation. This calculation has not been validated in all clinical situations. eGFR's persistently <60 mL/min signify possible Chronic Kidney Disease. 02/20/2007 2110    Lipid Panel     Component Value Date/Time   CHOL 205* 07/07/2008 2226   TRIG 216* 07/07/2008 2226   HDL 64 07/07/2008 2226   CHOLHDL 3.2 Ratio 07/07/2008 2226   VLDL 43* 07/07/2008 2226   LDLCALC 98 07/07/2008 2226    CBC    Component Value Date/Time   WBC 9.7 02/20/2007 2110   RBC 4.33 02/20/2007 2110   HGB 12.0 02/20/2007 2110   HCT 35.8* 02/20/2007 2110   PLT 284 02/20/2007 2110   MCV 82.6 02/20/2007 2110   MCHC 33.6 02/20/2007 2110   RDW 13.5 02/20/2007 2110   LYMPHSABS 1.0 02/20/2007 2110   MONOABS 0.7 02/20/2007 2110   EOSABS 0.1 02/20/2007 2110   BASOSABS 0.0 02/20/2007 2110

## 2012-02-27 ENCOUNTER — Telehealth: Payer: Self-pay

## 2012-02-27 ENCOUNTER — Encounter: Payer: Self-pay | Admitting: Physician Assistant

## 2012-02-27 ENCOUNTER — Ambulatory Visit: Payer: Medicare Other | Admitting: Cardiology

## 2012-02-27 ENCOUNTER — Ambulatory Visit (INDEPENDENT_AMBULATORY_CARE_PROVIDER_SITE_OTHER): Payer: Medicare Other | Admitting: Physician Assistant

## 2012-02-27 VITALS — BP 115/74 | HR 82 | Ht 65.0 in | Wt 160.0 lb

## 2012-02-27 DIAGNOSIS — I10 Essential (primary) hypertension: Secondary | ICD-10-CM

## 2012-02-27 DIAGNOSIS — I5181 Takotsubo syndrome: Secondary | ICD-10-CM

## 2012-02-27 DIAGNOSIS — Z01818 Encounter for other preprocedural examination: Secondary | ICD-10-CM

## 2012-02-27 NOTE — Assessment & Plan Note (Signed)
Patient needs to have right total knee replacement. She has history of Takosubos cardiomyopathy in March 2012 with normal coronary arteries on catheter and he agreed but LV dysfunction ejection fraction 34%. Her LV function has completely recovered on 2-D echo in June 2013. She is asymptomatic and Dr. Smith Mince has cleared her for surgery.

## 2012-02-27 NOTE — Patient Instructions (Addendum)
**Note De-Identified Abree Romick Obfuscation** You have been cleared to have surgery on knee.  Your physician recommends that you continue on your current medications as directed. Please refer to the Current Medication list given to you today.  Your physician wants you to follow-up in: 6 months. You will receive a reminder letter in the mail two months in advance. If you don't receive a letter, please call our office to schedule the follow-up appointment.

## 2012-02-27 NOTE — Progress Notes (Signed)
HPI:  [name] returns today for the evaluation and management of her history of Takosubos cardiomyopathy and needs surgical clearance for Right knee TKA by Dr Thomasena Edis. Repeat echocardiogram 07/2011 showed complete recovery of LV function with no wall motion abnormality.   Last office visit with Dr. Daleen Squibb she was having some palpitations and wanted to go back on her diltiazem. Her carvedilol was weaned and she was put back on diltiazem.she initially had a cardiac catheterization 2007 that showed normal LV function and no obstructive coronary artery disease. Last March while traveling in [place] she developed Takosubos cardiomyopathy after receiving news that her niece died and had a cardiac catheterization in [facility] that showed nonobstructive CAD but LV dysfunction EF 34% and mild MR. Thankfully the followup echo in June 2013 showed normal LV function. She denies any chest pain, palpitations, dyspnea, dyspnea on exertion, dizziness, or presyncope. Her palpitations are much better since she is back on the diltiazem.  She's had a lot of arthritis in her knee and surgery is recommended. She is feeling better since she's had a cortisone injection and is taking Tylenol thousand milligrams b.i.d.         Allergies: -- Codeine   -- Hydrocodone   -- Morphine   -- Procaine Hcl   -- Statins   Current Outpatient Prescriptions on File Prior to Visit: aspirin 81 MG EC tablet, Take 81 mg by mouth daily.  , Disp: , Rfl:  Calcium Carbonate-Vitamin D (CALTRATE 600+D) 600-400 MG-UNIT per tablet, Take 1 tablet by mouth 2 (two) times daily.  , Disp: , Rfl:  carvedilol (COREG) 6.25 MG tablet, Take 1 tablet (6.25 mg total) by mouth 2 (two) times daily., Disp: 180 tablet, Rfl: 3 Cholecalciferol (VITAMIN D) 1000 UNITS capsule, Take 1,000 Units by mouth daily.  , Disp: , Rfl:  Cyanocobalamin (B-12 TR) 1000 MCG TBCR, Take 1 tablet by mouth daily.  , Disp: , Rfl:  diazepam (VALIUM) 5 MG tablet, Take  5 mg by mouth every 6 (six) hours as needed.  , Disp: , Rfl:  diltiazem (TIAZAC) 300 MG 24 hr capsule, Take 1 capsule (300 mg total) by mouth daily., Disp: 30 capsule, Rfl: 11 docusate sodium (COLACE) 100 MG capsule, Take 100 mg by mouth 2 (two) times daily. Take 2 at bedtime and 1 every other day , Disp: , Rfl:  esomeprazole (NEXIUM) 40 MG capsule, Take 40 mg by mouth daily before breakfast.  , Disp: , Rfl:  fexofenadine (ALLEGRA) 180 MG tablet, Take 180 mg by mouth daily.  , Disp: , Rfl:  levothyroxine (SYNTHROID, LEVOTHROID) 88 MCG tablet, Take 88 mcg by mouth daily.  , Disp: , Rfl:  magnesium oxide (MAG-OX) 400 MG tablet, Take 400 mg by mouth daily.  , Disp: , Rfl:  meclizine (ANTIVERT) 25 MG tablet, Take 25 mg by mouth as needed.  , Disp: , Rfl:  meloxicam (MOBIC) 15 MG tablet, Take 15 mg by mouth at bedtime.  , Disp: , Rfl:  mometasone (NASONEX) 50 MCG/ACT nasal spray, Place 1 spray into the nose daily.  , Disp: , Rfl:  NITROSTAT 0.4 MG SL tablet, ONE TABLET UNDER TONGUE EVERY 5 MINUTES AS NEEDED FOR CHEST PAIN---MAY REPEAT TIMES THREE, Disp: 25 tablet, Rfl: 0 pravastatin (PRAVACHOL) 20 MG tablet, Take 20 mg by mouth at bedtime.  , Disp: , Rfl:  sertraline (ZOLOFT) 50 MG tablet, Take 50 mg by mouth daily.  , Disp: , Rfl:     Past Medical History:  Allergic rhinitis                                            MI (myocardial infarction)                                     Comment:vasospasm, no stents   Breast cancer                                   1999, 2008     Comment:s/p bilat mastectomy   Pulmonary nodules                                              Comment:no change CT chest 3/10   GERD (gastroesophageal reflux disease)                       HTN (hypertension)                                           Hyperlipidemia                                               CAD (coronary artery disease)                                  Comment:nonobstractive' with underlying coronary spasm     MI, acute, non ST segment elevation                            Comment:hx   Dyspnea                                                        Comment:on exertion   Palpitations                                                   Comment:hx; tachy  Past Surgical History:   bilateral mastectomy                            1999, 2008   NASAL SINUS SURGERY                             1972         THYROIDECTOMY  1965         TONSILLECTOMY                                   1950s       Review of patient's family history indicates:   Heart attack                   Father                   Melanoma                       Brother                  Stroke                         Mother                   Heart attack                   Brother                  Heart attack                   Brother                  Leukemia                       Brother                  Other                          Mother                     Comment: rheumatism   Social History   Marital Status: Married             Spouse Name:                      Years of Education:                 Number of children:             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 Widowed.     ROS: See history of present illness otherwise negative  PHYSICAL EXAM: Well-nournished, in no acute distress. Neck: No JVD, HJR, Bruit, or thyroid enlargement  Lungs: No tachypnea, clear without wheezing, rales, or rhonchi  Cardiovascular: RRR, PMI not displaced,positive S4 and 2/6 systolic murmur at the  left sternal border, no bruit, thrill, or heave.  Abdomen: BS normal. Soft without organomegaly, masses, lesions or tenderness.  Extremities: without cyanosis, clubbing or edema. Good distal pulses bilateral  SKin: Warm, no lesions or rashes   Musculoskeletal: No deformities  Neuro: no focal signs  BP 115/74  Pulse 82  Ht 5\' 5"  (1.651 m)  Wt 160 lb (72.576 kg)  BMI 26.63 kg/m2    WUJ:WJXBJY sinus rhythm with nonspecific ST-T wave changes no acute change from EKG in August 2012 2Decho 07/30/11: Study Conclusions  - Left ventricle: The cavity size was normal. Wall thickness was normal. Systolic function was low normal to mildly reduced. The estimated ejection fraction was in the range of 50% to 55%. Wall motion was normal; there were no regional wall motion abnormalities. Doppler parameters are consistent with abnormal left ventricular relaxation (grade 1 diastolic dysfunction). - Aortic valve: There was no stenosis. - Mitral valve: Mild regurgitation. - Left atrium: The atrium was mildly dilated. - Right ventricle: The cavity size was normal. Systolic function was normal. - Tricuspid valve: Peak RV-RA gradient: 26mm Hg (S). - Pulmonary arteries: PA peak pressure: 31mm Hg (S). - Inferior vena cava: The vessel was normal in size; the respirophasic diameter changes were in the normal range (= 50%); findings are consistent with normal central venous pressure. Impressions:  - Normal LV size with low normal to mildly decreased systolic function, EF 50-55%. No regional wall motion abnormalities. Normal RV size and systolic function. No significant valvular abnormalities.

## 2012-02-27 NOTE — Assessment & Plan Note (Signed)
controlled 

## 2012-02-27 NOTE — Assessment & Plan Note (Signed)
Patient has sternal recover her LV function on echo in June 2013

## 2012-02-27 NOTE — Telephone Encounter (Signed)
Pt. brought a surgical clearance letter with her to today's visit with Jacolyn Reedy, PA-C. Paper has been signed by Leda Gauze and Dr. Daleen Squibb giving pt clearance to have right TKA with Dr. Thomasena Edis. Signed paper work has been faxed to Natasha Mead at [facility] @ [phone] per Colin Broach request and a copy mailed to pt's address, pt is aware.

## 2012-03-06 ENCOUNTER — Ambulatory Visit: Payer: Self-pay | Admitting: Unknown Physician Specialty

## 2012-07-07 ENCOUNTER — Inpatient Hospital Stay: Admit: 2012-07-07 | Payer: Medicare Other | Admitting: Orthopedic Surgery

## 2012-07-07 SURGERY — ARTHROPLASTY, KNEE, TOTAL
Anesthesia: Choice | Site: Knee | Laterality: Right

## 2012-08-08 ENCOUNTER — Ambulatory Visit: Payer: Self-pay | Admitting: Internal Medicine

## 2012-08-08 LAB — CREATININE, SERUM
Creatinine: 1.09 mg/dL (ref 0.60–1.30)
EGFR (African American): 59 — ABNORMAL LOW

## 2012-08-19 ENCOUNTER — Encounter: Payer: Self-pay | Admitting: Cardiovascular Disease

## 2012-08-19 ENCOUNTER — Ambulatory Visit (INDEPENDENT_AMBULATORY_CARE_PROVIDER_SITE_OTHER): Payer: Medicare Other | Admitting: Cardiovascular Disease

## 2012-08-19 VITALS — BP 110/80 | HR 83 | Ht 65.0 in | Wt 158.0 lb

## 2012-08-19 DIAGNOSIS — I5181 Takotsubo syndrome: Secondary | ICD-10-CM

## 2012-08-19 DIAGNOSIS — I10 Essential (primary) hypertension: Secondary | ICD-10-CM

## 2012-08-19 DIAGNOSIS — E785 Hyperlipidemia, unspecified: Secondary | ICD-10-CM

## 2012-08-19 DIAGNOSIS — I2510 Atherosclerotic heart disease of native coronary artery without angina pectoris: Secondary | ICD-10-CM

## 2012-08-19 NOTE — Assessment & Plan Note (Signed)
She has recurred well or cardiomyopathy. No signs of diastolic or systolic heart failure.

## 2012-08-19 NOTE — Assessment & Plan Note (Signed)
She prefers to manage her cholesterol with diet and exercise

## 2012-08-19 NOTE — Patient Instructions (Addendum)
You are doing well. No medication changes were made.  Please call us if you have new issues that need to be addressed before your next appt.  Your physician wants you to follow-up in: 12 months.  You will receive a reminder letter in the mail two months in advance. If you don't receive a letter, please call our office to schedule the follow-up appointment. 

## 2012-08-19 NOTE — Assessment & Plan Note (Signed)
Blood pressure is well controlled on today's visit. No changes made to the medications. 

## 2012-08-19 NOTE — Progress Notes (Signed)
Patient ID: [name], female    DOB: 1940-04-11, 72 y.o.   MRN: [id number]  HPI Comments: [name] is a very pleasant 72 year old woman with history of stress-induced cardiopathy and 2013, initial decrease in her ejection fraction with improvement on subsequent echocardiograms , who presents for routine followup .  Overall she reports that she has been doing well. Blood pressure has been well-controlled. She is tolerating Cardizem daily. She is concerned that heart rate is elevated when she goes to exercise. Heart rate at rest is typically 78-85 beats per minute. With exertion, she has heart rates in the low 100s. She denies any significant symptoms.  Cholesterol has improved for more than 200 down to 171 with mild weight loss. LDL 84.  Her biggest complaint is she has had difficulty recovering from a feeling that her ears are clogged. She has seen ear nose throat and is scheduled to see a specialist at [facility]  She has had cardiac catheterization x2 in the past in 2009 in March 2013.   EKG today shows normal sinus rhythm with rate 84 beats per minute, nonspecific ST and T wave abnormality in the anterolateral leads, old inferior MI, poor R-wave progression to the anterior precordial leads concerning for old anterior infarct   Past Medical History . Allergic rhinitis  . MI (myocardial infarction)    vasospasm, no stents . Breast cancer 1999, 2008   s/p bilat mastectomy . Pulmonary nodules    no change CT chest 3/10 . GERD (gastroesophageal reflux disease)  . HTN (hypertension)  . Hyperlipidemia  . CAD (coronary artery disease)    nonobstractive' with underlying coronary spasm  . MI, acute, non ST segment elevation    hx . Dyspnea    on exertion . Palpitations    hx; tachy     Outpatient Encounter Prescriptions as of 08/19/2012  Medication Sig Dispense Refill  . acetaminophen (TYLENOL) 500 MG tablet Take 1,000 mg by mouth every 6 (six) hours as needed.      Marland Kitchen  aspirin 81 MG EC tablet Take 81 mg by mouth daily.        . Calcium Carbonate-Vitamin D (CALTRATE 600+D) 600-400 MG-UNIT per tablet Take 1 tablet by mouth 2 (two) times daily.        . Cholecalciferol (VITAMIN D) 1000 UNITS capsule Take 1,000 Units by mouth daily.        . Cyanocobalamin (B-12 TR) 1000 MCG TBCR Take 1 tablet by mouth daily.        . diazepam (VALIUM) 5 MG tablet Take 5 mg by mouth every 6 (six) hours as needed.        . diltiazem (TIAZAC) 300 MG 24 hr capsule Take 300 mg by mouth daily.      Marland Kitchen docusate sodium (COLACE) 100 MG capsule Take 100 mg by mouth 2 (two) times daily. Take 2 at bedtime and 1 every other day       . esomeprazole (NEXIUM) 40 MG capsule Take 40 mg by mouth 2 (two) times daily.       . fexofenadine (ALLEGRA) 180 MG tablet Take 180 mg by mouth daily.        Marland Kitchen levothyroxine (SYNTHROID, LEVOTHROID) 88 MCG tablet Take 88 mcg by mouth daily.        . magnesium oxide (MAG-OX) 400 MG tablet Take 400 mg by mouth daily.        . meclizine (ANTIVERT) 25 MG tablet Take 25 mg by mouth as  needed.        . meloxicam (MOBIC) 15 MG tablet Take 15 mg by mouth at bedtime.        . Misc Natural Products (COSAMIN ASU ADVANCED FORMULA PO) Take by mouth. 1 tab bid      . mometasone (NASONEX) 50 MCG/ACT nasal spray Place 1 spray into the nose daily.        Marland Kitchen NITROSTAT 0.4 MG SL tablet ONE TABLET UNDER TONGUE EVERY 5 MINUTES AS NEEDED FOR CHEST PAIN---MAY REPEAT TIMES THREE  25 tablet  0  . pravastatin (PRAVACHOL) 20 MG tablet Take 20 mg by mouth at bedtime.        . sertraline (ZOLOFT) 50 MG tablet Pt takes  1 tab once a week -she is trying to come off med        Review of Systems  Constitutional: Negative.   HENT: Positive for hearing loss, ear pain and sinus pressure.   Eyes: Negative.   Respiratory: Negative.   Cardiovascular: Negative.   Gastrointestinal: Negative.   Musculoskeletal: Negative.   Skin: Negative.   Neurological: Negative.   Psychiatric/Behavioral:  Negative.   All other systems reviewed and are negative.    BP 110/80  Pulse 83  Ht 5\' 5"  (1.651 m)  Wt 158 lb (71.668 kg)  BMI 26.29 kg/m2  Physical Exam  Nursing note and vitals reviewed. Constitutional: She is oriented to person, place, and time. She appears well-developed and well-nourished.  HENT:  Head: Normocephalic.  Nose: Nose normal.  Mouth/Throat: Oropharynx is clear and moist.  Eyes: Conjunctivae are normal. Pupils are equal, round, and reactive to light.  Neck: Normal range of motion. Neck supple. No JVD present.  Cardiovascular: Normal rate, regular rhythm, S1 normal, S2 normal and intact distal pulses.  Exam reveals no gallop and no friction rub.   Murmur heard.  Crescendo systolic murmur is present with a grade of 2/6  Pulmonary/Chest: Effort normal and breath sounds normal. No respiratory distress. She has no wheezes. She has no rales. She exhibits no tenderness.  Abdominal: Soft. Bowel sounds are normal. She exhibits no distension. There is no tenderness.  Musculoskeletal: Normal range of motion. She exhibits no edema and no tenderness.  Lymphadenopathy:    She has no cervical adenopathy.  Neurological: She is alert and oriented to person, place, and time. Coordination normal.  Skin: Skin is warm and dry. No rash noted. No erythema.  Psychiatric: She has a normal mood and affect. Her behavior is normal. Judgment and thought content normal.    Assessment and Plan

## 2012-08-27 ENCOUNTER — Encounter: Payer: Self-pay | Admitting: *Deleted

## 2012-08-28 ENCOUNTER — Ambulatory Visit: Payer: Medicare Other | Admitting: Cardiology

## 2012-09-24 ENCOUNTER — Other Ambulatory Visit: Payer: Self-pay

## 2012-12-25 ENCOUNTER — Other Ambulatory Visit: Payer: Self-pay

## 2013-04-01 ENCOUNTER — Other Ambulatory Visit (HOSPITAL_COMMUNITY): Payer: Self-pay | Admitting: Otolaryngology

## 2013-04-01 DIAGNOSIS — R42 Dizziness and giddiness: Secondary | ICD-10-CM

## 2013-04-02 ENCOUNTER — Ambulatory Visit (HOSPITAL_COMMUNITY)
Admission: RE | Admit: 2013-04-02 | Discharge: 2013-04-02 | Disposition: A | Discharge status: Home / Self Care | Payer: Medicare Other | Source: Ambulatory Visit | Attending: Otolaryngology | Admitting: Otolaryngology

## 2013-04-02 DIAGNOSIS — R42 Dizziness and giddiness: Secondary | ICD-10-CM

## 2013-04-08 ENCOUNTER — Other Ambulatory Visit: Payer: Self-pay | Admitting: Radiology

## 2013-04-08 ENCOUNTER — Other Ambulatory Visit (HOSPITAL_COMMUNITY): Payer: Self-pay | Admitting: Interventional Radiology

## 2013-04-08 DIAGNOSIS — H9319 Tinnitus, unspecified ear: Secondary | ICD-10-CM

## 2013-04-09 ENCOUNTER — Other Ambulatory Visit: Payer: Self-pay | Admitting: Radiology

## 2013-04-09 ENCOUNTER — Encounter (HOSPITAL_COMMUNITY): Payer: Self-pay | Admitting: Pharmacy Technician

## 2013-04-09 ENCOUNTER — Ambulatory Visit (HOSPITAL_COMMUNITY)
Admission: RE | Admit: 2013-04-09 | Discharge: 2013-04-09 | Disposition: A | Discharge status: Home / Self Care | Payer: Medicare Other | Source: Ambulatory Visit | Attending: Interventional Radiology | Admitting: Interventional Radiology

## 2013-04-09 ENCOUNTER — Encounter (HOSPITAL_COMMUNITY): Payer: Self-pay

## 2013-04-09 DIAGNOSIS — H9319 Tinnitus, unspecified ear: Secondary | ICD-10-CM | POA: Insufficient documentation

## 2013-04-09 DIAGNOSIS — I6529 Occlusion and stenosis of unspecified carotid artery: Secondary | ICD-10-CM | POA: Insufficient documentation

## 2013-04-09 DIAGNOSIS — I10 Essential (primary) hypertension: Secondary | ICD-10-CM | POA: Insufficient documentation

## 2013-04-09 DIAGNOSIS — R918 Other nonspecific abnormal finding of lung field: Secondary | ICD-10-CM | POA: Insufficient documentation

## 2013-04-09 DIAGNOSIS — Z853 Personal history of malignant neoplasm of breast: Secondary | ICD-10-CM | POA: Insufficient documentation

## 2013-04-09 DIAGNOSIS — K219 Gastro-esophageal reflux disease without esophagitis: Secondary | ICD-10-CM | POA: Insufficient documentation

## 2013-04-09 DIAGNOSIS — I2510 Atherosclerotic heart disease of native coronary artery without angina pectoris: Secondary | ICD-10-CM | POA: Insufficient documentation

## 2013-04-09 DIAGNOSIS — Z9010 Acquired absence of unspecified breast and nipple: Secondary | ICD-10-CM | POA: Insufficient documentation

## 2013-04-09 DIAGNOSIS — I252 Old myocardial infarction: Secondary | ICD-10-CM | POA: Insufficient documentation

## 2013-04-09 DIAGNOSIS — E785 Hyperlipidemia, unspecified: Secondary | ICD-10-CM | POA: Insufficient documentation

## 2013-04-09 LAB — CBC
HEMATOCRIT: 37.4 % (ref 36.0–46.0)
Hemoglobin: 12.4 g/dL (ref 12.0–15.0)
MCH: 25.9 pg — ABNORMAL LOW (ref 26.0–34.0)
MCHC: 33.2 g/dL (ref 30.0–36.0)
MCV: 78.2 fL (ref 78.0–100.0)
Platelets: 274 10*3/uL (ref 150–400)
RBC: 4.78 MIL/uL (ref 3.87–5.11)
RDW: 16.2 % — ABNORMAL HIGH (ref 11.5–15.5)
WBC: 6.5 10*3/uL (ref 4.0–10.5)

## 2013-04-09 LAB — PROTIME-INR
INR: 0.89 (ref 0.00–1.49)
Prothrombin Time: 11.9 seconds (ref 11.6–15.2)

## 2013-04-09 LAB — BASIC METABOLIC PANEL
BUN: 21 mg/dL (ref 6–23)
CHLORIDE: 103 meq/L (ref 96–112)
CO2: 24 mEq/L (ref 19–32)
CREATININE: 0.8 mg/dL (ref 0.50–1.10)
Calcium: 9.5 mg/dL (ref 8.4–10.5)
GFR calc Af Amer: 83 mL/min — ABNORMAL LOW (ref >90)
GFR calc non Af Amer: 72 mL/min — ABNORMAL LOW (ref >90)
Glucose, Bld: 90 mg/dL (ref 70–99)
Potassium: 3.8 mEq/L (ref 3.7–5.3)
Sodium: 143 mEq/L (ref 137–147)

## 2013-04-09 LAB — APTT: aPTT: 26 seconds (ref 24–37)

## 2013-04-09 MED ORDER — SODIUM CHLORIDE 0.9 % IV SOLN
Freq: Once | INTRAVENOUS | Status: AC
  Administered 2013-04-09: 10:00:00 via INTRAVENOUS

## 2013-04-09 NOTE — H&P (Signed)
[name] is an 73 y.o. female.   Chief Complaint: Pt has suffered L ear tinnitus- "beat" sound x 8 months. Comes and goes; not every day PCP dc'd Melaxicam 05/2012 and symptoms resolved x 4 months Re occurred in 01/2013. Now worse when leaning forward or in quiet room. MRI neg in 08/2012 Consulted with Dr Estanislado Pandy last week. Worrisome for Arteriovenous malformation. Now scheduled for cerebral arteriogram and evaluation   HPI: Breast Ca; CAD/MI; HTN; GERD; HLD  Past Medical History  Diagnosis Date  . Allergic rhinitis   . MI (myocardial infarction)     vasospasm, no stents  . Breast cancer 1999, 2008    s/p bilat mastectomy  . Pulmonary nodules     no change CT chest 3/10  . GERD (gastroesophageal reflux disease)   . HTN (hypertension)   . Hyperlipidemia   . MI, acute, non ST segment elevation     hx  . Dyspnea     on exertion  . Palpitations     hx; tachy  . CAD (coronary artery disease)     nonobstractive' with underlying coronary spasm     Past Surgical History  Procedure Laterality Date  . Bilateral mastectomy  1999, 2008  . Nasal sinus surgery  1972  . Thyroidectomy  1965  . Tonsillectomy  1950s  . Cardiac catheterization  04/2011    [facility] in [place]    Family History  Problem Relation Age of Onset  . Heart attack Father   . Melanoma Brother   . Stroke Mother   . Heart attack Brother   . Heart attack Brother   . Leukemia Brother   . Other Mother     rheumatism   Social History:  reports that she has never smoked. She has never used smokeless tobacco. She reports that she does not drink alcohol or use illicit drugs.  Allergies:  Allergies  Allergen Reactions  . Codeine Nausea And Vomiting  . Hydrocodone Nausea And Vomiting  . Mobic [Meloxicam] Other (See Comments)    Heart racing  . Morphine Nausea And Vomiting  . Prednisone     Keeps her awake  . Procaine Hcl   . Statins Other (See Comments)    Leg cramps     (Not in a hospital  admission)  Results for orders placed during the hospital encounter of 04/09/13 (from the past 48 hour(s))  APTT     Status: None   Collection Time    04/09/13 10:00 AM      Result Value Ref Range   aPTT 26  24 - 37 seconds  BASIC METABOLIC PANEL     Status: Abnormal   Collection Time    04/09/13 10:00 AM      Result Value Ref Range   Sodium 143  137 - 147 mEq/L   Potassium 3.8  3.7 - 5.3 mEq/L   Chloride 103  96 - 112 mEq/L   CO2 24  19 - 32 mEq/L   Glucose, Bld 90  70 - 99 mg/dL   BUN 21  6 - 23 mg/dL   Creatinine, Ser 0.80  0.50 - 1.10 mg/dL   Calcium 9.5  8.4 - 10.5 mg/dL   GFR calc non Af Amer 72 (*) >90 mL/min   GFR calc Af Amer 83 (*) >90 mL/min   Comment: (NOTE)     The eGFR has been calculated using the CKD EPI equation.     This calculation has not been validated  in all clinical situations.     eGFR's persistently <90 mL/min signify possible Chronic Kidney     Disease.  CBC     Status: Abnormal   Collection Time    04/09/13 10:00 AM      Result Value Ref Range   WBC 6.5  4.0 - 10.5 K/uL   RBC 4.78  3.87 - 5.11 MIL/uL   Hemoglobin 12.4  12.0 - 15.0 g/dL   HCT 37.4  36.0 - 46.0 %   MCV 78.2  78.0 - 100.0 fL   MCH 25.9 (*) 26.0 - 34.0 pg   MCHC 33.2  30.0 - 36.0 g/dL   RDW 16.2 (*) 11.5 - 15.5 %   Platelets 274  150 - 400 K/uL  PROTIME-INR     Status: None   Collection Time    04/09/13 10:00 AM      Result Value Ref Range   Prothrombin Time 11.9  11.6 - 15.2 seconds   INR 0.89  0.00 - 1.49   No results found.  Review of Systems  Constitutional: Negative for fever and weight loss.  HENT: Positive for tinnitus.   Eyes: Negative for blurred vision and double vision.  Respiratory: Negative for shortness of breath.   Cardiovascular: Negative for chest pain.  Gastrointestinal: Negative for nausea, vomiting and abdominal pain.  Neurological: Positive for headaches. Negative for dizziness, tingling and weakness.  Psychiatric/Behavioral: Negative for substance  abuse.    Blood pressure 151/78, pulse 75, temperature 98.8 F (37.1 C), temperature source Oral, resp. rate 18, height '5\' 5"'  (1.651 m), weight 69.514 kg (153 lb 4 oz), SpO2 99.00%. Physical Exam  Constitutional: She is oriented to person, place, and time. She appears well-developed and well-nourished.  Cardiovascular: Normal rate, regular rhythm and normal heart sounds.   No murmur heard. Respiratory: Effort normal and breath sounds normal. She has no wheezes.  GI: Soft. Bowel sounds are normal. There is no tenderness.  Musculoskeletal: Normal range of motion.  Neurological: She is alert and oriented to person, place, and time.  Skin: Skin is warm and dry.  Psychiatric: She has a normal mood and affect. Her behavior is normal. Judgment and thought content normal.     Assessment/Plan L ear tinnitus x 8 mo Scheduled for cerebral arteriogram in IR today Pt aware of procedure benefits and risks and agreeable to proceed  Consent signed and in chart  Karielle Davidow A 04/09/2013, 1:03 PM

## 2013-04-10 ENCOUNTER — Ambulatory Visit (HOSPITAL_COMMUNITY)
Admission: RE | Admit: 2013-04-10 | Discharge: 2013-04-10 | Disposition: A | Discharge status: Home / Self Care | Payer: Medicare Other | Source: Ambulatory Visit | Attending: Interventional Radiology | Admitting: Interventional Radiology

## 2013-04-10 ENCOUNTER — Other Ambulatory Visit (HOSPITAL_COMMUNITY): Payer: Self-pay | Admitting: Interventional Radiology

## 2013-04-10 DIAGNOSIS — H9319 Tinnitus, unspecified ear: Secondary | ICD-10-CM

## 2013-04-10 MED ORDER — HEPARIN SODIUM (PORCINE) 1000 UNIT/ML IJ SOLN
INTRAMUSCULAR | Status: AC | PRN
  Administered 2013-04-10: 1000 [IU] via INTRAVENOUS
  Administered 2013-04-10: 500 [IU] via INTRAVENOUS

## 2013-04-10 MED ORDER — FENTANYL CITRATE 0.05 MG/ML IJ SOLN
INTRAMUSCULAR | Status: AC
  Filled 2013-04-10: qty 2

## 2013-04-10 MED ORDER — IOHEXOL 300 MG/ML  SOLN
150.0000 mL | Freq: Once | INTRAMUSCULAR | Status: AC | PRN
  Administered 2013-04-10: 1 mL via INTRA_ARTERIAL

## 2013-04-10 MED ORDER — MIDAZOLAM HCL 2 MG/2ML IJ SOLN
INTRAMUSCULAR | Status: AC | PRN
  Administered 2013-04-10: 1 mg via INTRAVENOUS

## 2013-04-10 MED ORDER — HYDRALAZINE HCL 20 MG/ML IJ SOLN
INTRAMUSCULAR | Status: AC
  Filled 2013-04-10: qty 1

## 2013-04-10 MED ORDER — MIDAZOLAM HCL 2 MG/2ML IJ SOLN
INTRAMUSCULAR | Status: AC
  Filled 2013-04-10: qty 4

## 2013-04-10 MED ORDER — SODIUM CHLORIDE 0.9 % IV SOLN: INTRAVENOUS | Status: AC

## 2013-04-10 MED ORDER — FENTANYL CITRATE 0.05 MG/ML IJ SOLN
INTRAMUSCULAR | Status: AC | PRN
  Administered 2013-04-10 (×2): 25 ug via INTRAVENOUS

## 2013-04-10 NOTE — Progress Notes (Signed)
Pt ambulated to bathroom after completion of bedrest.  Voided without difficulty.   Right groin site stable.  Pt with no complaints.

## 2013-04-10 NOTE — Progress Notes (Signed)
Received from radiology via stretcher.  Pt awake, A&O.  Right groin site CDI with occlusive dressing.  Pt transferred to bed and orders reviewed. Call bell give to pt and po's offered.  Will continue to monitor.

## 2013-04-10 NOTE — Procedures (Signed)
S/P 4 vessel cerebral arteriogram  Rt CfA approach . Findings. 5moderately severe FMD like changes onvolving mid to distal LT ICA with focal fusiform dilatation,and to a lesser degree the mid RT ICA. Prominent basilar venous plexus

## 2013-04-10 NOTE — Discharge Instructions (Signed)
Angiography, Care After °Refer to this sheet in the next few weeks. These instructions provide you with information on caring for yourself after your procedure. Your health care provider may also give you more specific instructions. Your treatment has been planned according to current medical practices, but problems sometimes occur. Call your health care provider if you have any problems or questions after your procedure.  °WHAT TO EXPECT AFTER THE PROCEDURE °After your procedure, it is typical to have the following sensations: °· Minor discomfort or tenderness and a small bump at the catheter insertion site. The bump should usually decrease in size and tenderness within 1 to 2 weeks. °· Any bruising will usually fade within 2 to 4 weeks. °HOME CARE INSTRUCTIONS  °· You may need to keep taking blood thinners if they were prescribed for you. Only take over-the-counter or prescription medicines for pain, fever, or discomfort as directed by your health care provider. °· Do not apply powder or lotion to the site. °· Do not sit in a bathtub, swimming pool, or whirlpool for 5 to 7 days. °· You may shower 24 hours after the procedure. Remove the bandage (dressing) and gently wash the site with plain soap and water. Gently pat the site dry. °· Inspect the site at least twice daily. °· Limit your activity for the first 48 hours. Do not bend, squat, or lift anything over 20 lb (9 kg) or as directed by your health care provider. °· Do not drive home if you are discharged the day of the procedure. Have someone else drive you. Follow instructions about when you can drive or return to work. °SEEK MEDICAL CARE IF: °· You get lightheaded when standing up. °· You have drainage (other than a small amount of blood on the dressing). °· You have chills. °· You have a fever. °· You have redness, warmth, swelling, or pain at the insertion site. °SEEK IMMEDIATE MEDICAL CARE IF:  °· You develop chest pain or shortness of breath, feel faint,  or pass out. °· You have bleeding, swelling larger than a walnut, or drainage from the catheter insertion site. °· You develop pain, discoloration, coldness, or severe bruising in the leg or arm that held the catheter. °· You develop bleeding from any other place, such as the bowels. You may see bright red blood in your urine or stools, or your stools may appear black and tarry. °· You have heavy bleeding from the site. If this happens, hold pressure on the site. °MAKE SURE YOU: °· Understand these instructions. °· Will watch your condition. °· Will get help right away if you are not doing well or get worse. °Document Released: 08/24/2004 Document Revised: 10/08/2012 Document Reviewed: 06/30/2012 °Exit[employer]® Patient Information ©2014 [employer]. ° °

## 2013-04-15 ENCOUNTER — Other Ambulatory Visit (HOSPITAL_COMMUNITY): Payer: Self-pay | Admitting: Interventional Radiology

## 2013-04-15 DIAGNOSIS — H9319 Tinnitus, unspecified ear: Secondary | ICD-10-CM

## 2013-04-20 ENCOUNTER — Other Ambulatory Visit (HOSPITAL_COMMUNITY): Payer: Self-pay | Admitting: Interventional Radiology

## 2013-04-20 ENCOUNTER — Telehealth (HOSPITAL_COMMUNITY): Payer: Self-pay | Admitting: Interventional Radiology

## 2013-04-20 DIAGNOSIS — H9319 Tinnitus, unspecified ear: Secondary | ICD-10-CM

## 2013-04-20 NOTE — Telephone Encounter (Signed)
Called pt and spoke to pt and her husband. Asked them (per Dr. Estanislado Pandy) if they wanted to make an appt for a consultation to discuss the angio since they had so many questions. They stated they would really like to do that, an appt was made. JM

## 2013-04-24 ENCOUNTER — Ambulatory Visit (HOSPITAL_COMMUNITY): Admission: RE | Admit: 2013-04-24 | Payer: Medicare Other | Source: Ambulatory Visit

## 2013-04-29 ENCOUNTER — Ambulatory Visit (HOSPITAL_COMMUNITY)
Admission: RE | Admit: 2013-04-29 | Discharge: 2013-04-29 | Disposition: A | Discharge status: Home / Self Care | Payer: Medicare Other | Source: Ambulatory Visit | Attending: Interventional Radiology | Admitting: Interventional Radiology

## 2013-04-29 DIAGNOSIS — H9319 Tinnitus, unspecified ear: Secondary | ICD-10-CM

## 2013-05-20 DIAGNOSIS — I219 Acute myocardial infarction, unspecified: Secondary | ICD-10-CM

## 2013-05-20 HISTORY — DX: Acute myocardial infarction, unspecified: I21.9

## 2013-05-24 ENCOUNTER — Inpatient Hospital Stay (HOSPITAL_COMMUNITY)
Admission: EM | Admit: 2013-05-24 | Discharge: 2013-05-29 | DRG: 281 | Disposition: A | Discharge status: Home / Self Care | Payer: Medicare Other | Attending: Cardiovascular Disease | Admitting: Cardiovascular Disease

## 2013-05-24 ENCOUNTER — Emergency Department: Payer: Self-pay | Admitting: Emergency Medicine

## 2013-05-24 ENCOUNTER — Ambulatory Visit (HOSPITAL_COMMUNITY): Admit: 2013-05-24 | Payer: Self-pay | Admitting: Cardiovascular Disease

## 2013-05-24 ENCOUNTER — Encounter (HOSPITAL_COMMUNITY): Payer: Self-pay | Admitting: Emergency Medicine

## 2013-05-24 ENCOUNTER — Encounter (HOSPITAL_COMMUNITY)
Admission: EM | Disposition: A | Discharge status: Home / Self Care | Payer: Self-pay | Source: Home / Self Care | Attending: Cardiovascular Disease

## 2013-05-24 DIAGNOSIS — R918 Other nonspecific abnormal finding of lung field: Secondary | ICD-10-CM | POA: Diagnosis present

## 2013-05-24 DIAGNOSIS — Z806 Family history of leukemia: Secondary | ICD-10-CM

## 2013-05-24 DIAGNOSIS — Z853 Personal history of malignant neoplasm of breast: Secondary | ICD-10-CM

## 2013-05-24 DIAGNOSIS — H9319 Tinnitus, unspecified ear: Secondary | ICD-10-CM | POA: Diagnosis present

## 2013-05-24 DIAGNOSIS — I252 Old myocardial infarction: Secondary | ICD-10-CM

## 2013-05-24 DIAGNOSIS — I219 Acute myocardial infarction, unspecified: Secondary | ICD-10-CM

## 2013-05-24 DIAGNOSIS — Z823 Family history of stroke: Secondary | ICD-10-CM

## 2013-05-24 DIAGNOSIS — Z9010 Acquired absence of unspecified breast and nipple: Secondary | ICD-10-CM

## 2013-05-24 DIAGNOSIS — I959 Hypotension, unspecified: Secondary | ICD-10-CM | POA: Diagnosis present

## 2013-05-24 DIAGNOSIS — I472 Ventricular tachycardia, unspecified: Secondary | ICD-10-CM | POA: Diagnosis not present

## 2013-05-24 DIAGNOSIS — I2119 ST elevation (STEMI) myocardial infarction involving other coronary artery of inferior wall: Principal | ICD-10-CM | POA: Diagnosis present

## 2013-05-24 DIAGNOSIS — K219 Gastro-esophageal reflux disease without esophagitis: Secondary | ICD-10-CM | POA: Diagnosis present

## 2013-05-24 DIAGNOSIS — I4729 Other ventricular tachycardia: Secondary | ICD-10-CM | POA: Diagnosis not present

## 2013-05-24 DIAGNOSIS — E039 Hypothyroidism, unspecified: Secondary | ICD-10-CM | POA: Diagnosis present

## 2013-05-24 DIAGNOSIS — Z808 Family history of malignant neoplasm of other organs or systems: Secondary | ICD-10-CM

## 2013-05-24 DIAGNOSIS — I2510 Atherosclerotic heart disease of native coronary artery without angina pectoris: Secondary | ICD-10-CM

## 2013-05-24 DIAGNOSIS — I213 ST elevation (STEMI) myocardial infarction of unspecified site: Secondary | ICD-10-CM

## 2013-05-24 DIAGNOSIS — E876 Hypokalemia: Secondary | ICD-10-CM | POA: Diagnosis present

## 2013-05-24 DIAGNOSIS — Z888 Allergy status to other drugs, medicaments and biological substances status: Secondary | ICD-10-CM

## 2013-05-24 DIAGNOSIS — I10 Essential (primary) hypertension: Secondary | ICD-10-CM | POA: Diagnosis present

## 2013-05-24 DIAGNOSIS — J309 Allergic rhinitis, unspecified: Secondary | ICD-10-CM | POA: Diagnosis present

## 2013-05-24 DIAGNOSIS — I5181 Takotsubo syndrome: Secondary | ICD-10-CM | POA: Diagnosis present

## 2013-05-24 DIAGNOSIS — R0602 Shortness of breath: Secondary | ICD-10-CM

## 2013-05-24 DIAGNOSIS — I2582 Chronic total occlusion of coronary artery: Secondary | ICD-10-CM | POA: Diagnosis present

## 2013-05-24 DIAGNOSIS — Z8249 Family history of ischemic heart disease and other diseases of the circulatory system: Secondary | ICD-10-CM

## 2013-05-24 DIAGNOSIS — I255 Ischemic cardiomyopathy: Secondary | ICD-10-CM | POA: Diagnosis present

## 2013-05-24 DIAGNOSIS — I2589 Other forms of chronic ischemic heart disease: Secondary | ICD-10-CM | POA: Diagnosis present

## 2013-05-24 DIAGNOSIS — Z885 Allergy status to narcotic agent status: Secondary | ICD-10-CM

## 2013-05-24 DIAGNOSIS — E785 Hyperlipidemia, unspecified: Secondary | ICD-10-CM | POA: Diagnosis present

## 2013-05-24 HISTORY — DX: Hypothyroidism, unspecified: E03.9

## 2013-05-24 HISTORY — PX: LEFT HEART CATHETERIZATION WITH CORONARY ANGIOGRAM: SHX5451

## 2013-05-24 HISTORY — DX: Ischemic cardiomyopathy: I25.5

## 2013-05-24 LAB — CBC WITH DIFFERENTIAL/PLATELET
BASOS ABS: 0.1 10*3/uL (ref 0.0–0.1)
Basophil %: 1 %
Eosinophil #: 0.1 10*3/uL (ref 0.0–0.7)
Eosinophil %: 0.8 %
HCT: 39.5 % (ref 35.0–47.0)
HGB: 12.6 g/dL (ref 12.0–16.0)
LYMPHS ABS: 2.9 10*3/uL (ref 1.0–3.6)
LYMPHS PCT: 22.5 %
MCH: 25.2 pg — AB (ref 26.0–34.0)
MCHC: 31.9 g/dL — ABNORMAL LOW (ref 32.0–36.0)
MCV: 79 fL — ABNORMAL LOW (ref 80–100)
Monocyte #: 1.1 x10 3/mm — ABNORMAL HIGH (ref 0.2–0.9)
Monocyte %: 8.3 %
Neutrophil #: 8.6 10*3/uL — ABNORMAL HIGH (ref 1.4–6.5)
Neutrophil %: 67.4 %
Platelet: 278 10*3/uL (ref 150–440)
RBC: 4.99 10*6/uL (ref 3.80–5.20)
RDW: 16.3 % — AB (ref 11.5–14.5)
WBC: 12.7 10*3/uL — AB (ref 3.6–11.0)

## 2013-05-24 LAB — COMPREHENSIVE METABOLIC PANEL
Albumin: 3.3 g/dL — ABNORMAL LOW (ref 3.4–5.0)
Alkaline Phosphatase: 113 U/L
Anion Gap: 7 (ref 7–16)
BUN: 20 mg/dL — ABNORMAL HIGH (ref 7–18)
Bilirubin,Total: 0.2 mg/dL (ref 0.2–1.0)
CALCIUM: 8.8 mg/dL (ref 8.5–10.1)
CREATININE: 0.84 mg/dL (ref 0.60–1.30)
Chloride: 105 mmol/L (ref 98–107)
Co2: 26 mmol/L (ref 21–32)
EGFR (Non-African Amer.): >60
Glucose: 124 mg/dL — ABNORMAL HIGH (ref 65–99)
OSMOLALITY: 280 (ref 275–301)
Potassium: 4 mmol/L (ref 3.5–5.1)
SGOT(AST): 10 U/L — ABNORMAL LOW (ref 15–37)
SGPT (ALT): 21 U/L (ref 12–78)
SODIUM: 138 mmol/L (ref 136–145)
TOTAL PROTEIN: 6.8 g/dL (ref 6.4–8.2)

## 2013-05-24 LAB — TROPONIN I: Troponin-I: 0.06 ng/mL — ABNORMAL HIGH

## 2013-05-24 SURGERY — LEFT HEART CATHETERIZATION WITH CORONARY ANGIOGRAM
Anesthesia: Choice | Laterality: Bilateral

## 2013-05-24 MED ORDER — PANTOPRAZOLE SODIUM 40 MG PO TBEC
40.0000 mg | DELAYED_RELEASE_TABLET | Freq: Every day | ORAL | Status: DC
  Administered 2013-05-25 – 2013-05-29 (×5): 40 mg via ORAL
  Filled 2013-05-24 (×5): qty 1

## 2013-05-24 MED ORDER — TICAGRELOR 90 MG PO TABS
ORAL_TABLET | ORAL | Status: AC
  Filled 2013-05-24: qty 1

## 2013-05-24 MED ORDER — MAGNESIUM OXIDE 400 (241.3 MG) MG PO TABS
400.0000 mg | ORAL_TABLET | Freq: Every day | ORAL | Status: DC
  Administered 2013-05-25 – 2013-05-29 (×5): 400 mg via ORAL
  Filled 2013-05-24 (×6): qty 1

## 2013-05-24 MED ORDER — NITROGLYCERIN IN D5W 200-5 MCG/ML-% IV SOLN
2.0000 ug/min | INTRAVENOUS | Status: DC
Start: 2013-05-24 — End: 2013-05-27
  Administered 2013-05-25: 60 ug/min via INTRAVENOUS
  Filled 2013-05-24: qty 250

## 2013-05-24 MED ORDER — NITROGLYCERIN 0.2 MG/ML ON CALL CATH LAB
INTRAVENOUS | Status: AC
  Filled 2013-05-24: qty 1

## 2013-05-24 MED ORDER — ONDANSETRON HCL 4 MG/2ML IJ SOLN
4.0000 mg | Freq: Four times a day (QID) | INTRAMUSCULAR | Status: DC | PRN
  Administered 2013-05-25 – 2013-05-27 (×6): 4 mg via INTRAVENOUS
  Filled 2013-05-24 (×7): qty 2

## 2013-05-24 MED ORDER — SODIUM CHLORIDE 0.9 % IV SOLN
INTRAVENOUS | Status: DC
  Administered 2013-05-25: 75 mL/h via INTRAVENOUS
  Administered 2013-05-26: 1 mL via INTRAVENOUS

## 2013-05-24 MED ORDER — LEVOTHYROXINE SODIUM 88 MCG PO TABS
88.0000 ug | ORAL_TABLET | Freq: Every day | ORAL | Status: DC
  Administered 2013-05-25 – 2013-05-26 (×2): 88 ug via ORAL
  Filled 2013-05-24 (×4): qty 1

## 2013-05-24 MED ORDER — LIDOCAINE HCL (PF) 1 % IJ SOLN
INTRAMUSCULAR | Status: AC
  Filled 2013-05-24: qty 30

## 2013-05-24 MED ORDER — FENTANYL CITRATE 0.05 MG/ML IJ SOLN
INTRAMUSCULAR | Status: AC
  Administered 2013-05-24: 25 ug via INTRAVENOUS
  Filled 2013-05-24: qty 2

## 2013-05-24 MED ORDER — NITROGLYCERIN IN D5W 200-5 MCG/ML-% IV SOLN
INTRAVENOUS | Status: AC
  Filled 2013-05-24: qty 250

## 2013-05-24 MED ORDER — DILTIAZEM HCL ER BEADS 300 MG PO CP24: 300.0000 mg | ORAL_CAPSULE | Freq: Every day | ORAL | Status: DC

## 2013-05-24 MED ORDER — FENTANYL CITRATE 0.05 MG/ML IJ SOLN
25.0000 ug | INTRAMUSCULAR | Status: DC | PRN
  Administered 2013-05-24 – 2013-05-27 (×18): 25 ug via INTRAVENOUS
  Filled 2013-05-24 (×12): qty 2

## 2013-05-24 MED ORDER — ASPIRIN 81 MG PO CHEW: 81.0000 mg | CHEWABLE_TABLET | Freq: Every day | ORAL | Status: DC

## 2013-05-24 MED ORDER — NITROGLYCERIN 0.4 MG SL SUBL: 0.4000 mg | SUBLINGUAL_TABLET | SUBLINGUAL | Status: DC | PRN

## 2013-05-24 MED ORDER — ACETAMINOPHEN 500 MG PO TABS: 1000.0000 mg | ORAL_TABLET | Freq: Four times a day (QID) | ORAL | Status: DC | PRN

## 2013-05-24 MED ORDER — MIDAZOLAM HCL 2 MG/2ML IJ SOLN
INTRAMUSCULAR | Status: AC
  Filled 2013-05-24: qty 2

## 2013-05-24 MED ORDER — FENTANYL CITRATE 0.05 MG/ML IJ SOLN
INTRAMUSCULAR | Status: AC
  Administered 2013-05-25: 25 ug via INTRAVENOUS
  Filled 2013-05-24: qty 2

## 2013-05-24 MED ORDER — ASPIRIN EC 81 MG PO TBEC
81.0000 mg | DELAYED_RELEASE_TABLET | Freq: Every day | ORAL | Status: DC
  Administered 2013-05-25 – 2013-05-28 (×5): 81 mg via ORAL
  Filled 2013-05-24 (×8): qty 1

## 2013-05-24 MED ORDER — TICAGRELOR 90 MG PO TABS
90.0000 mg | ORAL_TABLET | Freq: Two times a day (BID) | ORAL | Status: DC
  Administered 2013-05-25 – 2013-05-28 (×7): 90 mg via ORAL
  Filled 2013-05-24 (×9): qty 1

## 2013-05-24 MED ORDER — BIVALIRUDIN 250 MG IV SOLR
INTRAVENOUS | Status: AC
  Filled 2013-05-24: qty 250

## 2013-05-24 MED ORDER — HEPARIN (PORCINE) IN NACL 2-0.9 UNIT/ML-% IJ SOLN
INTRAMUSCULAR | Status: AC
  Filled 2013-05-24: qty 1000

## 2013-05-24 NOTE — H&P (Signed)
    Pt was reexamined and existing H & P reviewed.  Lorretta Harp, MD [facility] 05/24/2013 9:18 PM

## 2013-05-24 NOTE — CV Procedure (Signed)
EDIT [name] is a 73 y.o. female    [id number] LOCATION:  FACILITY: [facility]  PHYSICIAN: Quay Burow, M.D. [birth year]   DATE OF PROCEDURE:  05/24/2013  DATE OF DISCHARGE:     CARDIAC CATHETERIZATION     History obtained from chart review. [name] is a 73 year old female patient Dr. Elwyn Reach who lives in [place]. She has a history of CAD status post cath in the past revealing normal coronary arteries, coronary spasm andTakasubo syndrome. She has hypertension and hyperlipidemia as well. She developed chest pain approximately 7 PM this evening and was seen at [facility] emergency room where she was noted to have inferolateral ST segment elevation with Cipro and her depression. She was to with aspirin, heparin and transferred for urgent cardiac catheterization.   PROCEDURE DESCRIPTION:   The patient was brought to the second floor [facility] cath lab in the postabsorptive state. She was  premedicated with IV Versed and fentanyl.. Her right groinwas prepped and shaved in usual sterile fashion. Xylocaine 1% was used for local anesthesia. A 6 French sheath was inserted into the right common femoral artery using standard Seldinger technique. The 5 French right and left Judkins diagnostic catheters along with a 5 French pigtail catheter were used for selective coronary angiography and left ventriculography respectively. Visipaque dye was used for the entirety of the case. Retrograde aortic, left ventricular and blood pressures were recorded.   HEMODYNAMICS:    AO SYSTOLIC/AO DIASTOLIC: 401/02   LV SYSTOLIC/LV DIASTOLIC: 725/36  ANGIOGRAPHIC RESULTS:   1. Left main; normal  2. LAD; normal 3. Left circumflex; nondominant with an occluded distal circumflex on a bend..  4. Right coronary artery; dominant with a long 90% segmental mid-PDA stenosis. The vessel is at most 2 mm. This was new since the prior cath in 2007. 5. Left ventriculography; RAO left  ventriculogram was performed using  25 mL of Visipaque dye at 12 mL/second. The overall LVEF estimated  35-40 %  Wit  wall motion abnormalities notable for inferolateral/inferoapical akinesia.  IMPRESSION:[name] has an occluded distal nondominant circumflex that supplies several posterior lateral branches. In addition, she has 90% segmental mid-PDA stenosis and a 2 mm vessel that is new since her prior cath 2 years ago. Her EKG is most consistent with a circumflex occlusion. This is a very tortuous vessel. We'll proceed with attempt at percutaneous revascularization.  Procedure description:The patient received Brilenta 180 mg by mouth and Angiomax bolus and ACT of 359. Total contrast used during the case was 135 cc. Using a 6 [place] XB 3 Guide catheter along with an 014/190 cm pro water guidewire and a 1.5 mm x 12 mm balloon attempts were made to cross the distal occlusion unsuccessfully because of extreme tortuosity. There was a dissection on the distal band. I do not think this is percutaneously fixable given a 260 bend and the fact that I was unable to cross the total occlusion. I believe the safest thing is to allow her to complete her inferolateral infarct and stage potential percutaneous intervention of her PDA. The the guidewire and catheter were removed and the sheath was then securely in place. The patient left the Cath Lab in stable condition. Angiomax was turned off.  Final impression: Unsuccessful attempt at percutaneous revascularization of an occluded distal circumflex in the setting of an acute inferolateral STEMI. She does have a significant wall motion abnormality in this distribution. There were 3-4 small posterior lateral branches beyond the occlusion noted on her cath  performed at 2007. I do not think that her PDA is large enough to accommodate a drug-eluting stent potentially a 2 mm bare-metal stent. I will not address the posterior descending artery lesion during this  catheterization for her to get over her infarct.   Lorretta Harp MD, FACC this during this test 05/24/2013 10:15 PM

## 2013-05-24 NOTE — ED Provider Notes (Signed)
TIME SEEN: 9:12 PM  CHIEF COMPLAINT: Code STEMI  HPI: Patient is a 73 y.o. F with history of prior MI due to vasospasm, breast cancer status post bilateral mastectomy, hypertension, hyperlipidemia who presents to the emergency department with chest pain that started at 6:30 PM. She has had associated nausea. She was seen at [facility] emergency department and diagnosed with STEMI.  She has received a heparin bolus, fentanyl, nitroglycerin, aspirin. Cardiology at bedside.  ROS: See HPI Constitutional: no fever  Eyes: no drainage  ENT: no runny nose   Cardiovascular:   chest pain  Resp: no SOB  GI: no vomiting GU: no dysuria Integumentary: no rash  Allergy: no hives  Musculoskeletal: no leg swelling  Neurological: no slurred speech ROS otherwise negative  PAST MEDICAL HISTORY/PAST SURGICAL HISTORY:  Past Medical History  Diagnosis Date  . Allergic rhinitis   . MI (myocardial infarction)     vasospasm, no stents  . Breast cancer 1999, 2008    s/p bilat mastectomy  . Pulmonary nodules     no change CT chest 3/10  . GERD (gastroesophageal reflux disease)   . HTN (hypertension)   . Hyperlipidemia   . MI, acute, non ST segment elevation     hx  . Dyspnea     on exertion  . Palpitations     hx; tachy  . CAD (coronary artery disease)     nonobstractive' with underlying coronary spasm     MEDICATIONS:  Prior to Admission medications   Medication Sig Start Date End Date Taking? Authorizing Provider  acetaminophen (TYLENOL) 500 MG tablet Take 1,000 mg by mouth every 6 (six) hours as needed for moderate pain.     Historical Provider, MD  aspirin 81 MG EC tablet Take 81 mg by mouth at bedtime.     Historical Provider, MD  Calcium Carbonate-Vitamin D (CALTRATE 600+D) 600-400 MG-UNIT per tablet Take 1 tablet by mouth 2 (two) times daily.      Historical Provider, MD  Cholecalciferol (VITAMIN D) 1000 UNITS capsule Take 1,000 Units by mouth daily.      Historical Provider, MD   Cyanocobalamin (B-12 TR) 1000 MCG TBCR Take 1,000 mcg by mouth daily.     Historical Provider, MD  diltiazem (TIAZAC) 300 MG 24 hr capsule Take 300 mg by mouth daily.    Historical Provider, MD  docusate sodium (COLACE) 100 MG capsule Take 200 mg by mouth at bedtime.     Historical Provider, MD  esomeprazole (NEXIUM) 40 MG capsule Take 40 mg by mouth daily.     Historical Provider, MD  fexofenadine (ALLEGRA) 180 MG tablet Take 180 mg by mouth daily.      Historical Provider, MD  Flaxseed, Linseed, (FLAX SEEDS) POWD Take 30 mLs by mouth daily. 2 tablespoonful    Historical Provider, MD  levothyroxine (SYNTHROID, LEVOTHROID) 88 MCG tablet Take 88 mcg by mouth daily.      Historical Provider, MD  magnesium oxide (MAG-OX) 400 MG tablet Take 400 mg by mouth daily.      Historical Provider, MD  Misc Natural Products (COSAMIN ASU ADVANCED FORMULA PO) Take 1 tablet by mouth 2 (two) times daily.     Historical Provider, MD  mometasone (NASONEX) 50 MCG/ACT nasal spray Place 2 sprays into the nose at bedtime.     Historical Provider, MD  nitroGLYCERIN (NITROSTAT) 0.4 MG SL tablet Place 0.4 mg under the tongue every 5 (five) minutes as needed for chest pain. May repeat three times  Historical Provider, MD  pravastatin (PRAVACHOL) 20 MG tablet Take 20 mg by mouth at bedtime.      Historical Provider, MD  ranitidine (ZANTAC) 150 MG tablet Take 150 mg by mouth every evening.    Historical Provider, MD    ALLERGIES:  Allergies  Allergen Reactions  . Codeine Nausea And Vomiting  . Hydrocodone Nausea And Vomiting  . Mobic [Meloxicam] Other (See Comments)    Heart racing  . Morphine Nausea And Vomiting  . Prednisone     Keeps her awake  . Procaine Hcl   . Statins Other (See Comments)    Leg cramps lipitor only    SOCIAL HISTORY:  History  Substance Use Topics  . Smoking status: Never Smoker   . Smokeless tobacco: Never Used  . Alcohol Use: No    FAMILY HISTORY: Family History  Problem  Relation Age of Onset  . Heart attack Father   . Melanoma Brother   . Stroke Mother   . Heart attack Brother   . Heart attack Brother   . Leukemia Brother   . Other Mother     rheumatism    EXAM: BP 160/87  Pulse 60  Temp(Src) 98.7 F (37.1 C) (Oral)  Resp 18  SpO2 94% CONSTITUTIONAL: Alert and oriented and responds appropriately to questions. Well-appearing; well-nourished HEAD: Normocephalic EYES: Conjunctivae clear, PERRL ENT: normal nose; no rhinorrhea; moist mucous membranes; pharynx without lesions noted NECK: Supple, no meningismus, no LAD  CARD: RRR; S1 and S2 appreciated; no murmurs, no clicks, no rubs, no gallops RESP: Normal chest excursion without splinting or tachypnea; breath sounds clear and equal bilaterally; no wheezes, no rhonchi, no rales,  ABD/GI: Normal bowel sounds; non-distended; soft, non-tender, no rebound, no guarding BACK:  The back appears normal and is non-tender to palpation, there is no CVA tenderness EXT: Normal ROM in all joints; non-tender to palpation; no edema; normal capillary refill; no cyanosis    SKIN: Normal color for age and race; warm NEURO: Moves all extremities equally PSYCH: The patient's mood and manner are appropriate. Grooming and personal hygiene are appropriate.  MEDICAL DECISION MAKING: Patient here with STEMI. Cardiology, Dr. Kennith Center at bedtime. Patient to go to the catheterization lab. She is hemodynamically stable.   Date: 05/25/2013 20:59  Rate: 60  Rhythm: normal sinus rhythm  QRS Axis: normal  Intervals: normal  ST/T Wave abnormalities: ST elevation in inferior and lateral leads with reciprocal change  Conduction Disutrbances: none  Narrative Interpretation: inferolateral STEMI          Delice Bison Ward, DO 05/25/13 0018

## 2013-05-24 NOTE — H&P (Signed)
Admission History and Physical     Patient ID: [name], MRN: [id number], DOB: Jun 26, 1940 73 y.o. Date of Encounter: 05/25/2013, 12:37 AM  Primary Physician: Rusty Aus., MD Primary Cardiologist: Ida Rogue M  Chief Complaint: STEMI  History of Present Illness: [name] is a 73 y.o. female with a history of stress-induced cardiomyopathy in 2013 with subsequent recovery of her ejection fraction, who presented initally to [facility] ED with chest pressure and nausea starting this evening at 6:30.  EKG at [facility] showed inferior ST elevation, and she was given aspirin, heparin bolus, and nitro paste, and transferred to [facility].  [facility] she was nauseous/vomiting with 5/10 CP.  Initial vitals stable.  EKG showed inferior and lateral (v4-v6) ST elevation.  Taken directly to cath lab.  The patient states that she was in her usual state of health until this evening.  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 Allergic rhinitis   . MI (myocardial infarction)     vasospasm, no stents  . Breast cancer 1999, 2008    s/p bilat mastectomy  . Pulmonary nodules     no change CT chest 3/10  . GERD (gastroesophageal reflux disease)   . HTN (hypertension)   . Hyperlipidemia   . MI, acute, non ST segment elevation     hx  . Dyspnea     on exertion  . Palpitations     hx; tachy  . CAD (coronary artery disease)     nonobstractive' with underlying coronary spasm      Past Surgical History  Procedure Laterality Date  . Bilateral mastectomy  1999, 2008  . Nasal sinus surgery  1972  . Thyroidectomy  1965  . Tonsillectomy  1950s  . Cardiac catheterization  04/2011    [facility] in [place]      No current facility-administered medications on file prior to encounter.   Current Outpatient Prescriptions on File Prior to Encounter  Medication Sig Dispense Refill  . acetaminophen (TYLENOL) 500 MG tablet Take 1,000 mg  by mouth every 6 (six) hours as needed for moderate pain.       Marland Kitchen aspirin 81 MG EC tablet Take 81 mg by mouth at bedtime.       . Calcium Carbonate-Vitamin D (CALTRATE 600+D) 600-400 MG-UNIT per tablet Take 1 tablet by mouth 2 (two) times daily.        . Cholecalciferol (VITAMIN D) 1000 UNITS capsule Take 1,000 Units by mouth daily.        . Cyanocobalamin (B-12 TR) 1000 MCG TBCR Take 1,000 mcg by mouth daily.       Marland Kitchen diltiazem (TIAZAC) 300 MG 24 hr capsule Take 300 mg by mouth daily.      Marland Kitchen docusate sodium (COLACE) 100 MG capsule Take 200 mg by mouth at bedtime.       Marland Kitchen esomeprazole (NEXIUM) 40 MG capsule Take 40 mg by mouth daily.       . fexofenadine (ALLEGRA) 180 MG tablet Take 180 mg by mouth daily.        . Flaxseed, Linseed, (FLAX SEEDS) POWD Take 30 mLs by mouth daily. 2 tablespoonful      . levothyroxine (SYNTHROID, LEVOTHROID) 88 MCG tablet Take 88 mcg by mouth daily.        . magnesium oxide (MAG-OX) 400 MG tablet Take 400 mg by mouth daily.        Marland Kitchen  Misc Natural Products (COSAMIN ASU ADVANCED FORMULA PO) Take 1 tablet by mouth 2 (two) times daily.       . mometasone (NASONEX) 50 MCG/ACT nasal spray Place 2 sprays into the nose at bedtime.       . nitroGLYCERIN (NITROSTAT) 0.4 MG SL tablet Place 0.4 mg under the tongue every 5 (five) minutes as needed for chest pain. May repeat three times      . pravastatin (PRAVACHOL) 20 MG tablet Take 20 mg by mouth at bedtime.        . ranitidine (ZANTAC) 150 MG tablet Take 150 mg by mouth every evening.          Allergies: Allergies  Allergen Reactions  . Codeine Nausea And Vomiting  . Hydrocodone Nausea And Vomiting  . Mobic [Meloxicam] Other (See Comments)    Heart racing  . Morphine Nausea And Vomiting  . Prednisone     Keeps her awake  . Procaine Hcl   . Statins Other (See Comments)    Leg cramps lipitor only     Social History:  The patient  reports that she has never smoked. She has never used smokeless tobacco. She reports  that she does not drink alcohol or use illicit drugs.   Family History:  The patient's family history includes Heart attack in her brother, brother, and father; Leukemia in her brother; Melanoma in her brother; Other in her mother; Stroke in her mother.   ROS:  Please see the history of present illness.  All other systems reviewed and negative.   Vital Signs: Blood pressure 120/70, pulse 64, temperature 97.7 F (36.5 C), temperature source Oral, resp. rate 21, height 5\' 5"  (1.651 m), weight 70.2 kg (154 lb 12.2 oz), SpO2 94.00%.  PHYSICAL EXAM: General:  Uncomfortable, pale.   HEENT: normal Lymph: no adenopathy Neck: no JVD Endocrine:  No thryomegaly Vascular: No carotid bruits; FA pulses 2+ bilaterally without bruits Cardiac:  normal S1, S2; RRR; no murmur Lungs:  clear to auscultation bilaterally, no wheezing, rhonchi or rales Abd: soft, nontender, no hepatomegaly Ext: no edema Musculoskeletal:  No deformities, BUE and BLE strength normal and equal Skin: warm and dry Neuro:  CNs 2-12 intact, no focal abnormalities noted Psych:  Normal affect   EKG:   ST elevation 5mm inferior and v4-v6.  Labs:   Lab Results  Component Value Date   WBC 6.5 04/09/2013   HGB 12.4 04/09/2013   HCT 37.4 04/09/2013   MCV 78.2 04/09/2013   PLT 274 04/09/2013   No results found for this basename: NA, K, CL, CO2, BUN, CREATININE, CALCIUM, LABALBU, PROT, BILITOT, ALKPHOS, ALT, AST, GLUCOSE,  in the last 168 hours  Recent Labs  05/24/13 2345  TROPONINI 7.10*   Lab Results  Component Value Date   CHOL 205* 07/07/2008   HDL 64 07/07/2008   LDLCALC 98 07/07/2008   TRIG 216* 07/07/2008   No results found for this basename: DDIMER    Radiology/Studies:  Ir Radiologist Eval & Mgmt  04/30/2013   EXAM: ESTABLISHED PATIENT OFFICE VISIT -LEVEL II (63875)  CHIEF COMPLAINT: Patient with chronic tinnitus. Primarily left-sided. Returns today for consultation post cerebral arteriogram performed April 09, 2013  HISTORY OF PRESENT ILLNESS: Patient has done well post procedure. She denies any real change in symptoms at this point. Symptoms are not any better but not worse either. She has had 2 nights that she has been unable to sleep secondary to the tinnitus.  Patient is here  today with a list of questions. She does review with [place] again her symptoms and their severity. She states her symptoms do seem to be related to the loudness in a room. She definitely notices her symptoms worsen after talking on the phone for any length of time.  PHYSICAL EXAMINATION: Patient comes to examination room today using a wheelchair. She has bilateral knee pain and needs replacement soon as per orthopedic physician. Otherwise in no apparent distress.  ASSESSMENT AND PLAN: Imaging of cerebral arteriogram was reviewed with the patient and her husband. 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 Also delineated for patient was the approximate 50% narrowing of the left internal carotid artery. No other abnormalities were noted.  All questions were answered satisfaction.  The patient is reminded to be careful of sudden jerking movements of her neck. Also to be careful of lifting anything over 10-15 lb.  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  Recommend:  Avoiding places with loud noise.  Patient will be scheduled for a six-month CT angio as follow-up . Scheduler will arrange and call patient with time and date.  They leave here today with good understanding of this plan.   Electronically Signed   By: Luanne Bras M.D.   On: 04/29/2013 14:30     ASSESSMENT AND PLAN:   1. STEMI - Culprit vessel appeared to be distal but large circumflex/OM that feeds posterior lateral territory based on comparison of cath from 2 years ago.  Unable to revascularize due to tortuosity of vessel.  Also with new 90% lesion in small PDA.    - Patient still with chest pain post cath, 5/10.   -  On aspirin, ticagrelor.  Will also restart heparin without bolus, start 2 hours post sheath pull. - BP and HR is under good control on nitroglycerin gtt.  Will start metoprolol 6.25mg  q12hr as well, up titrate as tolerated. - fentanyl prn pain as has codeine/morphine allergy.  - EF 35% on ventriculogram, BB as above.  Can add ACE-I as blood pressure tolerates.  - Continue statin, consider increasing dose if tolerated or adding zetia.   2.  Hypertension - Recently changed from diltiazem to coreg by home physician.  Will use metoprolol in combination with nitroglycerin gtt in the acute setting.  3. Hypothyroidism - Continue home synthroid dose.   4. Pulsatile tinnitus - Continue to monitor.    Signed,  Stephani Police, MD 05/25/2013, 12:37 AM

## 2013-05-24 NOTE — ED Notes (Signed)
Pt presents via [facility] from [facility] ED with c/o STEMI, pt reports onset CP at 1830 this evening. Pt given 4100mg  Heparin, 28mcg Fentanyl, 1inch Nitroglycerin, 325mg  ASA, 4mg  IV Zofran PTA. Pt now rates pain as 4/10. Cardiology at bedside.

## 2013-05-24 NOTE — ED Notes (Signed)
Pt transported to Cath Lab by this RN

## 2013-05-25 ENCOUNTER — Encounter (HOSPITAL_COMMUNITY): Payer: Self-pay | Admitting: *Deleted

## 2013-05-25 DIAGNOSIS — I472 Ventricular tachycardia, unspecified: Secondary | ICD-10-CM | POA: Diagnosis not present

## 2013-05-25 DIAGNOSIS — E039 Hypothyroidism, unspecified: Secondary | ICD-10-CM | POA: Diagnosis present

## 2013-05-25 DIAGNOSIS — I4729 Other ventricular tachycardia: Secondary | ICD-10-CM

## 2013-05-25 DIAGNOSIS — I219 Acute myocardial infarction, unspecified: Secondary | ICD-10-CM

## 2013-05-25 DIAGNOSIS — E876 Hypokalemia: Secondary | ICD-10-CM | POA: Diagnosis present

## 2013-05-25 LAB — POCT I-STAT, CHEM 8
BUN: 18 mg/dL (ref 6–23)
CREATININE: 0.7 mg/dL (ref 0.50–1.10)
Calcium, Ion: 1.1 mmol/L — ABNORMAL LOW (ref 1.13–1.30)
Chloride: 102 mEq/L (ref 96–112)
Glucose, Bld: 181 mg/dL — ABNORMAL HIGH (ref 70–99)
HCT: 36 % (ref 36.0–46.0)
Hemoglobin: 12.2 g/dL (ref 12.0–15.0)
Potassium: 3.2 mEq/L — ABNORMAL LOW (ref 3.7–5.3)
SODIUM: 134 meq/L — AB (ref 137–147)
TCO2: 22 mmol/L (ref 0–100)

## 2013-05-25 LAB — GLUCOSE, CAPILLARY: Glucose-Capillary: 111 mg/dL — ABNORMAL HIGH (ref 70–99)

## 2013-05-25 LAB — HEPARIN LEVEL (UNFRACTIONATED)
Heparin Unfractionated: 0.39 IU/mL (ref 0.30–0.70)
Heparin Unfractionated: 0.39 IU/mL (ref 0.30–0.70)

## 2013-05-25 LAB — TROPONIN I: Troponin I: 7.1 ng/mL (ref <0.30)

## 2013-05-25 LAB — BASIC METABOLIC PANEL
BUN: 19 mg/dL (ref 6–23)
CALCIUM: 8.7 mg/dL (ref 8.4–10.5)
CHLORIDE: 100 meq/L (ref 96–112)
CO2: 24 meq/L (ref 19–32)
Creatinine, Ser: 0.59 mg/dL (ref 0.50–1.10)
GFR calc Af Amer: >90 mL/min (ref >90)
GFR calc non Af Amer: 89 mL/min — ABNORMAL LOW (ref >90)
Glucose, Bld: 153 mg/dL — ABNORMAL HIGH (ref 70–99)
Potassium: 3.5 mEq/L — ABNORMAL LOW (ref 3.7–5.3)
Sodium: 139 mEq/L (ref 137–147)

## 2013-05-25 LAB — CBC
HEMATOCRIT: 34.7 % — AB (ref 36.0–46.0)
HEMOGLOBIN: 11.5 g/dL — AB (ref 12.0–15.0)
MCH: 26.1 pg (ref 26.0–34.0)
MCHC: 33.1 g/dL (ref 30.0–36.0)
MCV: 78.7 fL (ref 78.0–100.0)
Platelets: 245 10*3/uL (ref 150–400)
RBC: 4.41 MIL/uL (ref 3.87–5.11)
RDW: 15.3 % (ref 11.5–15.5)
WBC: 9.9 10*3/uL (ref 4.0–10.5)

## 2013-05-25 LAB — TSH: TSH: 0.271 u[IU]/mL — AB (ref 0.350–4.500)

## 2013-05-25 LAB — MAGNESIUM: Magnesium: 2 mg/dL (ref 1.5–2.5)

## 2013-05-25 LAB — POCT ACTIVATED CLOTTING TIME: Activated Clotting Time: 359 seconds

## 2013-05-25 LAB — MRSA PCR SCREENING: MRSA by PCR: NEGATIVE

## 2013-05-25 MED ORDER — POTASSIUM CHLORIDE 10 MEQ/100ML IV SOLN
10.0000 meq | INTRAVENOUS | Status: AC
  Administered 2013-05-25 (×2): 10 meq via INTRAVENOUS
  Filled 2013-05-25 (×2): qty 100

## 2013-05-25 MED ORDER — CARVEDILOL 3.125 MG PO TABS
3.1250 mg | ORAL_TABLET | Freq: Two times a day (BID) | ORAL | Status: DC
  Administered 2013-05-25 – 2013-05-26 (×3): 3.125 mg via ORAL
  Filled 2013-05-25 (×6): qty 1

## 2013-05-25 MED ORDER — METOPROLOL TARTRATE 25 MG/10 ML ORAL SUSPENSION
6.2500 mg | Freq: Two times a day (BID) | ORAL | Status: DC
  Administered 2013-05-25: 6.25 mg via ORAL
  Filled 2013-05-25 (×3): qty 2.5

## 2013-05-25 MED ORDER — ATROPINE SULFATE 0.1 MG/ML IJ SOLN
INTRAMUSCULAR | Status: AC
  Filled 2013-05-25: qty 10

## 2013-05-25 MED ORDER — METOPROLOL TARTRATE 12.5 MG HALF TABLET
6.2500 mg | ORAL_TABLET | Freq: Two times a day (BID) | ORAL | Status: DC
Start: 2013-05-25 — End: 2013-05-25

## 2013-05-25 MED ORDER — ONDANSETRON HCL 4 MG/2ML IJ SOLN
4.0000 mg | Freq: Once | INTRAMUSCULAR | Status: AC
  Administered 2013-05-25: 4 mg via INTRAVENOUS

## 2013-05-25 MED ORDER — AMIODARONE HCL IN DEXTROSE 360-4.14 MG/200ML-% IV SOLN
30.0000 mg/h | INTRAVENOUS | Status: DC
  Administered 2013-05-26: 30 mg/h via INTRAVENOUS
  Filled 2013-05-25 (×5): qty 200

## 2013-05-25 MED ORDER — HEPARIN (PORCINE) IN NACL 100-0.45 UNIT/ML-% IJ SOLN
1150.0000 [IU]/h | INTRAMUSCULAR | Status: DC
  Administered 2013-05-25 – 2013-05-27 (×2): 1000 [IU]/h via INTRAVENOUS
  Filled 2013-05-25 (×4): qty 250

## 2013-05-25 MED ORDER — PROMETHAZINE HCL 25 MG/ML IJ SOLN
12.5000 mg | Freq: Four times a day (QID) | INTRAMUSCULAR | Status: DC | PRN
  Administered 2013-05-25 – 2013-05-26 (×4): 12.5 mg via INTRAVENOUS
  Filled 2013-05-25 (×4): qty 1

## 2013-05-25 MED ORDER — SODIUM CHLORIDE 0.9 % IV SOLN: Freq: Once | INTRAVENOUS | Status: DC

## 2013-05-25 MED ORDER — AMIODARONE HCL IN DEXTROSE 360-4.14 MG/200ML-% IV SOLN
60.0000 mg/h | INTRAVENOUS | Status: AC
  Administered 2013-05-25 (×2): 60 mg/h via INTRAVENOUS

## 2013-05-25 MED ORDER — AMIODARONE LOAD VIA INFUSION
150.0000 mg | Freq: Once | INTRAVENOUS | Status: AC
  Administered 2013-05-25: 150 mg via INTRAVENOUS
  Filled 2013-05-25: qty 83.34

## 2013-05-25 MED ORDER — AMIODARONE HCL IN DEXTROSE 360-4.14 MG/200ML-% IV SOLN
INTRAVENOUS | Status: AC
  Filled 2013-05-25: qty 200

## 2013-05-25 MED FILL — Sodium Chloride IV Soln 0.9%: INTRAVENOUS | Qty: 50 | Status: AC

## 2013-05-25 NOTE — Progress Notes (Signed)
CRITICAL VALUE ALERT  Critical value received: Troponin >20.00  Date of notification: T  Time of notification: 0615  Critical value read back:yes  Nurse who received alert:  M. Rolls,RN  MD notified (1st page):  [place]  Time of first page:  0630  MD notified (2nd page):  Time of second page:  Responding MD: Michaelyn Barter  Time MD responded: [phone]

## 2013-05-25 NOTE — Progress Notes (Signed)
Pt admitted from [facility] Cath lab via hospital bed w/ O2, monitor and RN; Pt attached to unit monitors and O2; Pt introduced to staff and unit, MRSA swab done and 6 cloth CHG bath complete. Questions answered and support give, Pt's family to be brought to bedside for update soon. Pt reports a continued CP of 5/10, titrated NTG for pain and contacted Dr Gwenlyn Found r/t Nausea and analgesic, orders received.

## 2013-05-25 NOTE — Progress Notes (Signed)
ANTICOAGULATION CONSULT NOTE - Follow Up Consult  Pharmacy Consult for Heparin Indication: s/p  STEMI  Allergies  Allergen Reactions  . Codeine Nausea And Vomiting  . Hydrocodone Nausea And Vomiting  . Mobic [Meloxicam] Other (See Comments)    Heart racing  . Morphine Nausea And Vomiting  . Prednisone     Keeps her awake  . Procaine Hcl   . Statins Other (See Comments)    Leg cramps lipitor only    Patient Measurements: Height: 5\' 5"  (165.1 cm) Weight: 154 lb 12.2 oz (70.2 kg) IBW/kg (Calculated) : 57 Heparin Dosing Weight:  70.2  Vital Signs: Temp: 98.2 F (36.8 C) (04/06 1551) Temp src: Oral (04/06 1551) BP: 134/75 mmHg (04/06 1900) Pulse Rate: 70 (04/06 1900)  Labs:  Recent Labs  05/24/13 2132 05/24/13 2345 05/25/13 0056 05/25/13 0511 05/25/13 1132 05/25/13 1215 05/25/13 1821  HGB 12.2  --  11.5*  --   --   --   --   HCT 36.0  --  34.7*  --   --   --   --   PLT  --   --  245  --   --   --   --   HEPARINUNFRC  --   --   --   --   --  0.39 0.39  CREATININE 0.70  --  0.59  --   --   --   --   TROPONINI  --  7.10*  --  >20.00* >20.00*  --   --     Estimated Creatinine Clearance: 62.5 ml/min (by C-G formula based on Cr of 0.59).   Assessment: STEMI  S/p Cath 4/5--unable to cross distal CFX--poss staged PCI of PDA later this week. EF 35-40%  Anticoagulation: Heparin level 0.39 in goal. Hgb 11.5. Repeat heparin level 0.39 remains in goal this evening.  Goal of Therapy:  Heparin level 0.3-0.7 units/ml Monitor platelets by anticoagulation protocol: Yes   Plan:  Continue heparin at 1000 units/hr Next HL in am.   Darragh Nay S. Alford Highland, PharmD, BCPS Clinical Staff Pharmacist Pager [phone]  Eilene Ghazi Stillinger 05/25/2013,7:29 PM

## 2013-05-25 NOTE — Progress Notes (Signed)
Sheath in right femoral artery sutured in place, level 0. On arrival to remove sheath, pt experiencing  minimal chest pain, but during sheath removal patient began having 8/10 chest pain and nausea. Nitro gtt titrated, Fentanyl and Zofran given. Pressure held for 30 min starting at 0126 and ending at 0056. VS stable throughout sheath pull and groin level 0. Post sheath instructions given.

## 2013-05-25 NOTE — Plan of Care (Signed)
Problem: Phase I Progression Outcomes Goal: MD aware of Cardiac Marker results Outcome: Completed/Met Date Met:  05/25/13 Dr Kennith Center notified of (+) Troponin of 7.10

## 2013-05-25 NOTE — Progress Notes (Signed)
Patient Name: [name] Date of Encounter: 05/25/2013  Principal Problem:   STEMI (ST elevation myocardial infarction) Active Problems:   HYPERLIPIDEMIA   HYPERTENSION   G E R D   Hypothyroid    Patient Profile: 73 yo female w/ hx MI (spasm, Takotsubo), breast CA, HTN, HL, hypothyroid, admitted w/ STEMI 04/05 from [place]. Culprit vessel CFX, unsuccessful PCI. Needs PCI PDA. EF 35-40% (previously recovered to 50-55%)  SUBJECTIVE: Constant chest pain, 3/10, some SOB and nausea (improved w/ Zofran).   OBJECTIVE Filed Vitals:   05/25/13 0645 05/25/13 0700 05/25/13 0717 05/25/13 0800  BP: 109/67 115/63  109/68  Pulse: 76 77  76  Temp:   98.5 F (36.9 C)   TempSrc:   Oral   Resp: 21 24  17   Height:      Weight:      SpO2: 99% 97%  98%    Intake/Output Summary (Last 24 hours) at 05/25/13 0830 Last data filed at 05/25/13 0800  Gross per 24 hour  Intake 860.81 ml  Output    910 ml  Net -49.19 ml   Filed Weights   05/24/13 2245  Weight: 154 lb 12.2 oz (70.2 kg)    PHYSICAL EXAM General: Well developed, well nourished, female in moderate distress. Head: Normocephalic, atraumatic.  Neck: Supple without bruits, JVD not elevated. Lungs:  Resp regular and unlabored, few rales bases. Heart: RRR, S1, S2, no S3, S4, soft systolic murmur; no rub. Abdomen: Soft, non-tender, non-distended, BS + x 4.  Extremities: No clubbing, cyanosis, no edema. Right groin cath site without ecchymosis or hematoma, no bruit. Neuro: Alert and oriented X 3. Moves all extremities spontaneously. Psych: Normal affect.  LABS: CBC:  Recent Labs  05/25/13 0056  WBC 9.9  HGB 11.5*  HCT 34.7*  MCV 78.7  PLT 937   Basic Metabolic Panel:  Recent Labs  05/25/13 0056  NA 139  K 3.5*  CL 100  CO2 24  GLUCOSE 153*  BUN 19  CREATININE 0.59  CALCIUM 8.7  MG 2.0   Cardiac Enzymes:  Recent Labs  05/24/13 2345 05/25/13 0511  TROPONINI 7.10* >20.00*   No results found for  this basename: TSH    TELE:  SR, multiple runs VT    ECG: evolving MI changes.  Radiology/Studies: pending  Current Medications:  . aspirin EC  81 mg Oral QHS  . levothyroxine  88 mcg Oral QAC breakfast  . magnesium oxide  400 mg Oral Daily  . metoprolol tartrate  6.25 mg Oral BID  . pantoprazole  40 mg Oral Daily  . potassium chloride  10 mEq Intravenous Q1 Hr x 2  . Ticagrelor  90 mg Oral BID   . sodium chloride Stopped (05/25/13 0534)  . heparin 1,000 Units/hr (05/25/13 0600)  . nitroGLYCERIN 60 mcg/min (05/25/13 0600)    ASSESSMENT AND PLAN: Principal Problem:   STEMI (ST elevation myocardial infarction) - Rx symptoms. MD review films and advise on PCI. Tolerated BB poorly as SBP decreased 80s after first dose.  Active Problems:   VT - use IV BB as BP will allow. Mg is OK, but consider 1 Gm IV. Add IV Amio.   Hypokalemia - being supplemented,    HYPERLIPIDEMIA - ck profile, tolerates statins poorly, will restart home dose.   HYPERTENSION - controlled to hypotensive now.   G E R D - on PPI   Hypothyroid - continue Synthroid, ck TSH.  Keep ICU for now.  Signed, Rosaria Ferries , PA-C 8:30 AM 05/25/2013   Agree with note by Rosaria Ferries PA-C. Pt with Inferolateral STEMI last pm secondary to occluded distal ND LCX with tortuosity prelcuding successful revascularization. She has moderate LV dysfunction with infero-posterolateral AK, EF 40% and residual long segmental PDA disease in a 2.00 mm vessel. I have reviewed cath films with DRs Burt Knack and Tamala Julian both of whom agree on continued conservative/medical care of LCX allowing the infarct to complete. She has had continued CP over night with a long run of NSVT this AM. Exam benign. Labs OK (mg, K) and has 2 runs of KCL ordered. Will start IV amio. Her BP is very sensitive to low dose BB. Consider staged intervention of PDA although given its size medical therapy is also an option reserving intervention for symptoms. Will keep  in ICU for now.  Lorretta Harp, M.D., [place], [facility], [place] [address] [place], [place]  [address]  [phone] 05/25/2013 9:07 AM

## 2013-05-25 NOTE — Progress Notes (Signed)
CRITICAL VALUE ALERT  Critical value received:  Troponin = 7.10  Date of notification: t  Time of notification:  0025  Critical value read back:yes  Nurse who received alert:  Arloa Koh, RN  MD notified (1st page): [place]  Time of first page: 0030  MD notified (2nd page):   Time of second page:  Responding MD: Dr Kennith Center  Time MD responded:  At bedside

## 2013-05-25 NOTE — Progress Notes (Signed)
ANTICOAGULATION CONSULT NOTE - Follow Up Consult  Pharmacy Consult for Heparin Indication: s/p  STEMI  Allergies  Allergen Reactions  . Codeine Nausea And Vomiting  . Hydrocodone Nausea And Vomiting  . Mobic [Meloxicam] Other (See Comments)    Heart racing  . Morphine Nausea And Vomiting  . Prednisone     Keeps her awake  . Procaine Hcl   . Statins Other (See Comments)    Leg cramps lipitor only    Patient Measurements: Height: 5\' 5"  (165.1 cm) Weight: 154 lb 12.2 oz (70.2 kg) IBW/kg (Calculated) : 57 Heparin Dosing Weight:  70.2  Vital Signs: Temp: 97.5 F (36.4 C) (04/06 1207) Temp src: Oral (04/06 1207) BP: 112/66 mmHg (04/06 1400) Pulse Rate: 73 (04/06 1400)  Labs:  Recent Labs  05/24/13 2345 05/25/13 0056 05/25/13 0511 05/25/13 1132 05/25/13 1215  HGB  --  11.5*  --   --   --   HCT  --  34.7*  --   --   --   PLT  --  245  --   --   --   HEPARINUNFRC  --   --   --   --  0.39  CREATININE  --  0.59  --   --   --   TROPONINI 7.10*  --  >20.00* >20.00*  --     Estimated Creatinine Clearance: 62.5 ml/min (by C-G formula based on Cr of 0.59).   Assessment: STEMI  S/p Cath 4/5--unable to cross distal CFX--poss staged PCI of PDA later this week. EF 35-40%  Anticoagulation: Heparin level 0.39 in goal. Hgb 11.5.  Cardiovascular: H/o MI (2/2 vasospasm, [place]), HTN, HLD. She has had continued CP over night with a long run of NSVT this AM. BP has been very sensitive to low-dose BB. Meds: ASA81, Coreg, Magox, Brilinta, IV amiodarone started  Endocrinology: hypothyroid on Synthroid  Gastrointestinal / Nutrition: GERD on PPI  Nephrology: Scr 0.59 with CrCl 62  Hematology / Oncology: h/o breast cancer  PTA Medication Issues  Best Practices: IV heparin , PPI   Goal of Therapy:  Heparin level 0.3-0.7 units/ml Monitor platelets by anticoagulation protocol: Yes   Plan:  Continue heparin at 1000 units/hr and reconfirm level this PM.   Yolando Gillum S.  Alford Highland, PharmD, BCPS Clinical Staff Pharmacist Pager [phone]  Eilene Ghazi Stillinger 05/25/2013,2:51 PM

## 2013-05-25 NOTE — Progress Notes (Signed)
Dr. Gwenlyn Found made aware pt has had persistent nausea throughout shift despite PRN doses of zofran given. Pt reports only minimal relief that only lasts about an hour with zofran. No vomiting since this am. Orders received for PRN phenergan. Will continue to monitor closely. Fulton Reek, RN

## 2013-05-25 NOTE — Progress Notes (Signed)
MD Gwenlyn Found notified of troponin >20, vomiting episode x2 not time for next dose of Zofran. 4mg  Zofran ordered once now. CP 1/10. Will cont to monitor pt.

## 2013-05-25 NOTE — Progress Notes (Signed)
ANTICOAGULATION CONSULT NOTE - Initial Consult  Pharmacy Consult for Heparin Indication: chest pain/ACS  Allergies  Allergen Reactions  . Codeine Nausea And Vomiting  . Hydrocodone Nausea And Vomiting  . Mobic [Meloxicam] Other (See Comments)    Heart racing  . Morphine Nausea And Vomiting  . Prednisone     Keeps her awake  . Procaine Hcl   . Statins Other (See Comments)    Leg cramps lipitor only    Patient Measurements: Height: 5\' 5"  (165.1 cm) Weight: 154 lb 12.2 oz (70.2 kg) IBW/kg (Calculated) : 57  Vital Signs: Temp: 97.7 F (36.5 C) (04/05 2245) Temp src: Oral (04/05 2245) BP: 110/66 mmHg (04/06 0145) Pulse Rate: 56 (04/06 0145)  Labs:  Recent Labs  05/24/13 2345 05/25/13 0056  HGB  --  11.5*  HCT  --  34.7*  PLT  --  245  TROPONINI 7.10*  --     Estimated Creatinine Clearance: 62.5 ml/min (by C-G formula based on Cr of 0.8).   Medical History: Past Medical History  Diagnosis Date  . Allergic rhinitis   . MI (myocardial infarction)     vasospasm, no stents  . Breast cancer 1999, 2008    s/p bilat mastectomy  . Pulmonary nodules     no change CT chest 3/10  . GERD (gastroesophageal reflux disease)   . HTN (hypertension)   . Hyperlipidemia   . MI, acute, non ST segment elevation     hx  . Dyspnea     on exertion  . Palpitations     hx; tachy  . CAD (coronary artery disease)     nonobstractive' with underlying coronary spasm     Medications:  Prescriptions prior to admission  Medication Sig Dispense Refill  . acetaminophen (TYLENOL) 500 MG tablet Take 1,000 mg by mouth every 6 (six) hours as needed for moderate pain.       Marland Kitchen aspirin 81 MG EC tablet Take 81 mg by mouth at bedtime.       . Calcium Carbonate-Vitamin D (CALTRATE 600+D) 600-400 MG-UNIT per tablet Take 1 tablet by mouth 2 (two) times daily.        . Cholecalciferol (VITAMIN D) 1000 UNITS capsule Take 1,000 Units by mouth daily.        . Cyanocobalamin (B-12 TR) 1000 MCG TBCR  Take 1,000 mcg by mouth daily.       Marland Kitchen diltiazem (TIAZAC) 300 MG 24 hr capsule Take 300 mg by mouth daily.      Marland Kitchen docusate sodium (COLACE) 100 MG capsule Take 200 mg by mouth at bedtime.       Marland Kitchen esomeprazole (NEXIUM) 40 MG capsule Take 40 mg by mouth daily.       . fexofenadine (ALLEGRA) 180 MG tablet Take 180 mg by mouth daily.        . Flaxseed, Linseed, (FLAX SEEDS) POWD Take 30 mLs by mouth daily. 2 tablespoonful      . levothyroxine (SYNTHROID, LEVOTHROID) 88 MCG tablet Take 88 mcg by mouth daily.        . magnesium oxide (MAG-OX) 400 MG tablet Take 400 mg by mouth daily.        . Misc Natural Products (COSAMIN ASU ADVANCED FORMULA PO) Take 1 tablet by mouth 2 (two) times daily.       . mometasone (NASONEX) 50 MCG/ACT nasal spray Place 2 sprays into the nose at bedtime.       . nitroGLYCERIN (NITROSTAT) 0.4 MG SL  tablet Place 0.4 mg under the tongue every 5 (five) minutes as needed for chest pain. May repeat three times      . pravastatin (PRAVACHOL) 20 MG tablet Take 20 mg by mouth at bedtime.        . ranitidine (ZANTAC) 150 MG tablet Take 150 mg by mouth every evening.        Assessment: 73 yo female with STEMI s/p unsuccesful PTCA, awaiting possible PCI, for heparin   Goal of Therapy:  Heparin level 0.3-0.7 units/ml Monitor platelets by anticoagulation protocol: Yes   Plan:  Start heparin 1000 units/hr at 0400 Check heparin level in 8 hours.  Jazleen Robeck, Bronson Curb 05/25/2013,1:51 AM

## 2013-05-26 DIAGNOSIS — I10 Essential (primary) hypertension: Secondary | ICD-10-CM

## 2013-05-26 DIAGNOSIS — I2510 Atherosclerotic heart disease of native coronary artery without angina pectoris: Secondary | ICD-10-CM

## 2013-05-26 LAB — BASIC METABOLIC PANEL
BUN: 10 mg/dL (ref 6–23)
BUN: 8 mg/dL (ref 6–23)
CHLORIDE: 94 meq/L — AB (ref 96–112)
CO2: 24 meq/L (ref 19–32)
CO2: 22 mEq/L (ref 19–32)
Calcium: 8.2 mg/dL — ABNORMAL LOW (ref 8.4–10.5)
Calcium: 8.7 mg/dL (ref 8.4–10.5)
Chloride: 91 mEq/L — ABNORMAL LOW (ref 96–112)
Creatinine, Ser: 0.46 mg/dL — ABNORMAL LOW (ref 0.50–1.10)
Creatinine, Ser: 0.46 mg/dL — ABNORMAL LOW (ref 0.50–1.10)
GFR calc Af Amer: >90 mL/min (ref >90)
GFR calc Af Amer: >90 mL/min (ref >90)
GFR calc non Af Amer: >90 mL/min (ref >90)
GFR calc non Af Amer: >90 mL/min (ref >90)
GLUCOSE: 114 mg/dL — AB (ref 70–99)
Glucose, Bld: 138 mg/dL — ABNORMAL HIGH (ref 70–99)
POTASSIUM: 3.3 meq/L — AB (ref 3.7–5.3)
POTASSIUM: 3.6 meq/L — AB (ref 3.7–5.3)
Sodium: 132 mEq/L — ABNORMAL LOW (ref 137–147)
Sodium: 128 mEq/L — ABNORMAL LOW (ref 137–147)

## 2013-05-26 LAB — CBC
HCT: 32.9 % — ABNORMAL LOW (ref 36.0–46.0)
Hemoglobin: 10.8 g/dL — ABNORMAL LOW (ref 12.0–15.0)
MCH: 25.7 pg — ABNORMAL LOW (ref 26.0–34.0)
MCHC: 32.8 g/dL (ref 30.0–36.0)
MCV: 78.1 fL (ref 78.0–100.0)
PLATELETS: 201 10*3/uL (ref 150–400)
RBC: 4.21 MIL/uL (ref 3.87–5.11)
RDW: 15.4 % (ref 11.5–15.5)
WBC: 11.8 10*3/uL — AB (ref 4.0–10.5)

## 2013-05-26 LAB — HEPARIN LEVEL (UNFRACTIONATED): Heparin Unfractionated: 0.42 IU/mL (ref 0.30–0.70)

## 2013-05-26 LAB — MAGNESIUM: Magnesium: 1.8 mg/dL (ref 1.5–2.5)

## 2013-05-26 MED ORDER — LEVOTHYROXINE SODIUM 75 MCG PO TABS
75.0000 ug | ORAL_TABLET | Freq: Every day | ORAL | Status: DC
  Administered 2013-05-27 – 2013-05-29 (×3): 75 ug via ORAL
  Filled 2013-05-26 (×5): qty 1

## 2013-05-26 MED ORDER — POTASSIUM CHLORIDE 10 MEQ/100ML IV SOLN
10.0000 meq | INTRAVENOUS | Status: AC
  Administered 2013-05-26 (×2): 10 meq via INTRAVENOUS
  Filled 2013-05-26 (×2): qty 100

## 2013-05-26 MED ORDER — ISOSORBIDE MONONITRATE 15 MG HALF TABLET
15.0000 mg | ORAL_TABLET | Freq: Every day | ORAL | Status: DC
  Administered 2013-05-26 – 2013-05-29 (×4): 15 mg via ORAL
  Filled 2013-05-26 (×4): qty 1

## 2013-05-26 MED ORDER — AMIODARONE HCL 200 MG PO TABS
200.0000 mg | ORAL_TABLET | Freq: Every day | ORAL | Status: DC
  Administered 2013-05-26: 200 mg via ORAL
  Filled 2013-05-26 (×2): qty 1

## 2013-05-26 NOTE — Progress Notes (Signed)
Subjective:  Major complaint this AM is nausea and headache  Objective:  Temp:  [97.5 F (36.4 C)-99 F (37.2 C)] 98.3 F (36.8 C) (04/07 0716) Pulse Rate:  [67-82] 68 (04/07 0700) Resp:  [18-31] 24 (04/07 0700) BP: (106-142)/(59-89) 118/75 mmHg (04/07 0700) SpO2:  [93 %-99 %] 94 % (04/07 0700) Weight:  [159 lb 9.8 oz (72.4 kg)] 159 lb 9.8 oz (72.4 kg) (04/07 0300) Weight change: 4 lb 13.6 oz (2.2 kg)  Intake/Output from previous day: 04/06 0701 - 04/07 0700 In: 2465.9 [I.V.:2365.9; IV Piggyback:100] Out: 1520 [Urine:1520]  Intake/Output from this shift:    Physical Exam: General appearance: alert and mild distress Neck: no adenopathy, no carotid bruit, no JVD, supple, symmetrical, trachea midline and thyroid not enlarged, symmetric, no tenderness/mass/nodules Lungs: clear to auscultation bilaterally Heart: regular rate and rhythm, S1, S2 normal, no murmur, click, rub or gallop Extremities: extremities normal, atraumatic, no cyanosis or edema and Right groin OK. 2+ RPP  Lab Results: Results for orders placed during the hospital encounter of 05/24/13 (from the past 48 hour(s))  POCT I-STAT, CHEM 8     Status: Abnormal   Collection Time    05/24/13  9:32 PM      Result Value Ref Range   Sodium 134 (*) 137 - 147 mEq/L   Potassium 3.2 (*) 3.7 - 5.3 mEq/L   Chloride 102  96 - 112 mEq/L   BUN 18  6 - 23 mg/dL   Creatinine, Ser 0.70  0.50 - 1.10 mg/dL   Glucose, Bld 181 (*) 70 - 99 mg/dL   Calcium, Ion 1.10 (*) 1.13 - 1.30 mmol/L   TCO2 22  0 - 100 mmol/L   Hemoglobin 12.2  12.0 - 15.0 g/dL   HCT 36.0  36.0 - 46.0 %  POCT ACTIVATED CLOTTING TIME     Status: None   Collection Time    05/24/13  9:42 PM      Result Value Ref Range   Activated Clotting Time 359    MRSA PCR SCREENING     Status: None   Collection Time    05/24/13 11:12 PM      Result Value Ref Range   MRSA by PCR NEGATIVE  NEGATIVE   Comment:            The GeneXpert MRSA Assay (FDA     approved  for NASAL specimens     only), is one component of a     comprehensive MRSA colonization     surveillance program. It is not     intended to diagnose MRSA     infection nor to guide or     monitor treatment for     MRSA infections.  TROPONIN I     Status: Abnormal   Collection Time    05/24/13 11:45 PM      Result Value Ref Range   Troponin I 7.10 (*) <0.30 ng/mL   Comment:            Due to the release kinetics of cTnI,     a negative result within the first hours     of the onset of symptoms does not rule out     myocardial infarction with certainty.     If myocardial infarction is still suspected,     repeat the test at appropriate intervals.     CRITICAL RESULT CALLED TO, READ BACK BY AND VERIFIED WITH:     PERRIN J,RN  05/25/13 0024 WAYK  CBC     Status: Abnormal   Collection Time    05/25/13 12:56 AM      Result Value Ref Range   WBC 9.9  4.0 - 10.5 K/uL   RBC 4.41  3.87 - 5.11 MIL/uL   Hemoglobin 11.5 (*) 12.0 - 15.0 g/dL   HCT 34.7 (*) 36.0 - 46.0 %   MCV 78.7  78.0 - 100.0 fL   MCH 26.1  26.0 - 34.0 pg   MCHC 33.1  30.0 - 36.0 g/dL   RDW 15.3  11.5 - 15.5 %   Platelets 245  150 - 400 K/uL  BASIC METABOLIC PANEL     Status: Abnormal   Collection Time    05/25/13 12:56 AM      Result Value Ref Range   Sodium 139  137 - 147 mEq/L   Potassium 3.5 (*) 3.7 - 5.3 mEq/L   Chloride 100  96 - 112 mEq/L   CO2 24  19 - 32 mEq/L   Glucose, Bld 153 (*) 70 - 99 mg/dL   BUN 19  6 - 23 mg/dL   Creatinine, Ser 0.59  0.50 - 1.10 mg/dL   Calcium 8.7  8.4 - 10.5 mg/dL   GFR calc non Af Amer 89 (*) >90 mL/min   GFR calc Af Amer >90  >90 mL/min   Comment: (NOTE)     The eGFR has been calculated using the CKD EPI equation.     This calculation has not been validated in all clinical situations.     eGFR's persistently <90 mL/min signify possible Chronic Kidney     Disease.  MAGNESIUM     Status: None   Collection Time    05/25/13 12:56 AM      Result Value Ref Range    Magnesium 2.0  1.5 - 2.5 mg/dL  TROPONIN I     Status: Abnormal   Collection Time    05/25/13  5:11 AM      Result Value Ref Range   Troponin I >20.00 (*) <0.30 ng/mL   Comment:            Due to the release kinetics of cTnI,     a negative result within the first hours     of the onset of symptoms does not rule out     myocardial infarction with certainty.     If myocardial infarction is still suspected,     repeat the test at appropriate intervals.     CRITICAL VALUE NOTED.  VALUE IS CONSISTENT WITH PREVIOUSLY REPORTED AND CALLED VALUE.  GLUCOSE, CAPILLARY     Status: Abnormal   Collection Time    05/25/13  7:15 AM      Result Value Ref Range   Glucose-Capillary 111 (*) 70 - 99 mg/dL  TROPONIN I     Status: Abnormal   Collection Time    05/25/13 11:32 AM      Result Value Ref Range   Troponin I >20.00 (*) <0.30 ng/mL   Comment: CRITICAL VALUE NOTED.  VALUE IS CONSISTENT WITH PREVIOUSLY REPORTED AND CALLED VALUE.  TSH     Status: Abnormal   Collection Time    05/25/13 11:32 AM      Result Value Ref Range   TSH 0.271 (*) 0.350 - 4.500 uIU/mL   Comment: Please note change in reference range.  HEPARIN LEVEL (UNFRACTIONATED)     Status: None   Collection Time  05/25/13 12:15 PM      Result Value Ref Range   Heparin Unfractionated 0.39  0.30 - 0.70 IU/mL   Comment:            IF HEPARIN RESULTS ARE BELOW     EXPECTED VALUES, AND PATIENT     DOSAGE HAS BEEN CONFIRMED,     SUGGEST FOLLOW UP TESTING     OF ANTITHROMBIN III LEVELS.  HEPARIN LEVEL (UNFRACTIONATED)     Status: None   Collection Time    05/25/13  6:21 PM      Result Value Ref Range   Heparin Unfractionated 0.39  0.30 - 0.70 IU/mL   Comment:            IF HEPARIN RESULTS ARE BELOW     EXPECTED VALUES, AND PATIENT     DOSAGE HAS BEEN CONFIRMED,     SUGGEST FOLLOW UP TESTING     OF ANTITHROMBIN III LEVELS.  HEPARIN LEVEL (UNFRACTIONATED)     Status: None   Collection Time    05/26/13  4:20 AM      Result  Value Ref Range   Heparin Unfractionated 0.42  0.30 - 0.70 IU/mL   Comment:            IF HEPARIN RESULTS ARE BELOW     EXPECTED VALUES, AND PATIENT     DOSAGE HAS BEEN CONFIRMED,     SUGGEST FOLLOW UP TESTING     OF ANTITHROMBIN III LEVELS.  CBC     Status: Abnormal   Collection Time    05/26/13  4:20 AM      Result Value Ref Range   WBC 11.8 (*) 4.0 - 10.5 K/uL   RBC 4.21  3.87 - 5.11 MIL/uL   Hemoglobin 10.8 (*) 12.0 - 15.0 g/dL   HCT 32.9 (*) 36.0 - 46.0 %   MCV 78.1  78.0 - 100.0 fL   MCH 25.7 (*) 26.0 - 34.0 pg   MCHC 32.8  30.0 - 36.0 g/dL   RDW 15.4  11.5 - 15.5 %   Platelets 201  150 - 400 K/uL  BASIC METABOLIC PANEL     Status: Abnormal   Collection Time    05/26/13  4:20 AM      Result Value Ref Range   Sodium 132 (*) 137 - 147 mEq/L   Comment: DELTA CHECK NOTED   Potassium 3.3 (*) 3.7 - 5.3 mEq/L   Chloride 94 (*) 96 - 112 mEq/L   CO2 24  19 - 32 mEq/L   Glucose, Bld 138 (*) 70 - 99 mg/dL   BUN 10  6 - 23 mg/dL   Creatinine, Ser 0.46 (*) 0.50 - 1.10 mg/dL   Calcium 8.2 (*) 8.4 - 10.5 mg/dL   GFR calc non Af Amer >90  >90 mL/min   GFR calc Af Amer >90  >90 mL/min   Comment: (NOTE)     The eGFR has been calculated using the CKD EPI equation.     This calculation has not been validated in all clinical situations.     eGFR's persistently <90 mL/min signify possible Chronic Kidney     Disease.    Imaging: Imaging results have been reviewed  Assessment/Plan:   1. Principal Problem: 2.   STEMI (ST elevation myocardial infarction) 3. Active Problems: 4.   HYPERLIPIDEMIA 5.   HYPERTENSION 6.   G E R D 7.   Hypothyroid 8.   VT (ventricular tachycardia) 9.  Hypokalemia 10.   Time Spent Directly with Patient:  25 minutes  Length of Stay:  LOS: 2 days   Day #2 inferolateral STEMI with unsuccessful intervention secondary to vessel tortuosity. Pt has remained hemodynamically stable with no further significant arrhythmias. Major issues have been nausea  and headache which may be related to IV amio and NTG which we will change to PO. K+ 3.3 ---> replete. Will keep on OV hep today and D/C tomorrow at transfer to tele. Will need [facility]. Med Rx of residual CAD for now unless recurrent symptoms given size and length of PDA lesion.   Lorretta Harp 05/26/2013, 8:27 AM

## 2013-05-26 NOTE — Progress Notes (Signed)
ANTICOAGULATION CONSULT NOTE - Follow Up Consult  Pharmacy Consult for Heparin Indication: STEMI  Allergies  Allergen Reactions  . Codeine Nausea And Vomiting  . Hydrocodone Nausea And Vomiting  . Mobic [Meloxicam] Other (See Comments)    Heart racing  . Morphine Nausea And Vomiting  . Prednisone     Keeps her awake  . Procaine Hcl   . Statins Other (See Comments)    Leg cramps lipitor only    Patient Measurements: Height: 5\' 5"  (165.1 cm) Weight: 159 lb 9.8 oz (72.4 kg) IBW/kg (Calculated) : 57 Heparin Dosing Weight:  70 kg  Vital Signs: Temp: 98.2 F (36.8 C) (04/07 1110) Temp src: Oral (04/07 1110) BP: 118/75 mmHg (04/07 0700) Pulse Rate: 68 (04/07 0700)  Labs:  Recent Labs  05/24/13 2132 05/24/13 2345 05/25/13 0056 05/25/13 0511 05/25/13 1132 05/25/13 1215 05/25/13 1821 05/26/13 0420  HGB 12.2  --  11.5*  --   --   --   --  10.8*  HCT 36.0  --  34.7*  --   --   --   --  32.9*  PLT  --   --  245  --   --   --   --  201  HEPARINUNFRC  --   --   --   --   --  0.39 0.39 0.42  CREATININE 0.70  --  0.59  --   --   --   --  0.46*  TROPONINI  --  7.10*  --  >20.00* >20.00*  --   --   --     Estimated Creatinine Clearance: 63.4 ml/min (by C-G formula based on Cr of 0.46).   Assessment: 73 y.o. F who continues on heparin for STEMI, s/p cath that revealed distal circumflex and PDA occlusions - unable to do PCI. Heparin level remains therapeutic this morning (HL 0.42 << 0.39, goal of 0.3-0.7). Hgb/Hct/Plt slight drop. No overt s/sx of bleeding noted. Noted plans per cards for medical management and likely d/c of heparin when the patient transfers out of the ICU to a tele floor. Will f/u.  Goal of Therapy:  Heparin level 0.3-0.7 units/ml Monitor platelets by anticoagulation protocol: Yes   Plan:  1. Continue heparin at current rate of 1000 units/hr (10 ml/hr) 2. Will continue to monitor for any signs/symptoms of bleeding and will follow up with heparin level in  the a.m.   Alycia Rossetti, PharmD, BCPS Clinical Pharmacist Pager: [phone] 05/26/2013 11:33 AM

## 2013-05-26 NOTE — Progress Notes (Signed)
CARDIAC REHAB PHASE I   PRE:  Rate/Rhythm: 70 SR  BP:  Supine: 121/67  Sitting:   Standing:    SaO2: 95 2L 94 RA  MODE:  Ambulation: 100 ft   POST:  Rate/Rhythm: 87 SR  BP:  Supine:   Sitting: 127/77  Standing:    SaO2: 92 RA 1440-1530 On arrival pt in bed c/o of nausea and headache. Pt reluctant to move or get out of bed. Assisted X 1 to ambulate 100 feet, no c/o of cp or SOB. VS stable Pt to recliner after walk with call light in reach. Pt still with c/o of headache and nausea. Gave pt cracker and ice water to eat and drink. Will follow pt tomorrow.  Rodney Langton RN 05/26/2013 3:28 PM

## 2013-05-27 ENCOUNTER — Encounter (HOSPITAL_COMMUNITY): Payer: Self-pay | Admitting: Physician Assistant

## 2013-05-27 DIAGNOSIS — E876 Hypokalemia: Secondary | ICD-10-CM

## 2013-05-27 DIAGNOSIS — I2589 Other forms of chronic ischemic heart disease: Secondary | ICD-10-CM

## 2013-05-27 DIAGNOSIS — I959 Hypotension, unspecified: Secondary | ICD-10-CM | POA: Diagnosis present

## 2013-05-27 DIAGNOSIS — I255 Ischemic cardiomyopathy: Secondary | ICD-10-CM | POA: Diagnosis present

## 2013-05-27 LAB — CBC
HCT: 35.3 % — ABNORMAL LOW (ref 36.0–46.0)
Hemoglobin: 11.9 g/dL — ABNORMAL LOW (ref 12.0–15.0)
MCH: 25.9 pg — AB (ref 26.0–34.0)
MCHC: 33.7 g/dL (ref 30.0–36.0)
MCV: 76.9 fL — AB (ref 78.0–100.0)
PLATELETS: 207 10*3/uL (ref 150–400)
RBC: 4.59 MIL/uL (ref 3.87–5.11)
RDW: 15.1 % (ref 11.5–15.5)
WBC: 11.8 10*3/uL — ABNORMAL HIGH (ref 4.0–10.5)

## 2013-05-27 LAB — BASIC METABOLIC PANEL
BUN: 7 mg/dL (ref 6–23)
CALCIUM: 8.9 mg/dL (ref 8.4–10.5)
CO2: 22 meq/L (ref 19–32)
CREATININE: 0.57 mg/dL (ref 0.50–1.10)
Chloride: 102 mEq/L (ref 96–112)
GFR calc Af Amer: >90 mL/min (ref >90)
GLUCOSE: 116 mg/dL — AB (ref 70–99)
Potassium: 3.1 mEq/L — ABNORMAL LOW (ref 3.7–5.3)
Sodium: 140 mEq/L (ref 137–147)

## 2013-05-27 LAB — HEPARIN LEVEL (UNFRACTIONATED): Heparin Unfractionated: 0.22 IU/mL — ABNORMAL LOW (ref 0.30–0.70)

## 2013-05-27 MED ORDER — LORATADINE 10 MG PO TABS
10.0000 mg | ORAL_TABLET | Freq: Every day | ORAL | Status: DC
  Administered 2013-05-27 – 2013-05-29 (×3): 10 mg via ORAL
  Filled 2013-05-27 (×3): qty 1

## 2013-05-27 MED ORDER — CARVEDILOL 6.25 MG PO TABS
6.2500 mg | ORAL_TABLET | Freq: Two times a day (BID) | ORAL | Status: DC
  Administered 2013-05-27 – 2013-05-28 (×3): 6.25 mg via ORAL
  Filled 2013-05-27 (×6): qty 1

## 2013-05-27 MED ORDER — TRAMADOL HCL 50 MG PO TABS: 50.0000 mg | ORAL_TABLET | Freq: Four times a day (QID) | ORAL | Status: DC | PRN

## 2013-05-27 MED ORDER — FLUTICASONE PROPIONATE 50 MCG/ACT NA SUSP
2.0000 | Freq: Every day | NASAL | Status: DC
  Administered 2013-05-27 – 2013-05-29 (×3): 2 via NASAL
  Filled 2013-05-27 (×3): qty 16

## 2013-05-27 MED ORDER — DOCUSATE SODIUM 100 MG PO CAPS
200.0000 mg | ORAL_CAPSULE | Freq: Every day | ORAL | Status: DC
Start: 2013-05-27 — End: 2013-05-29
  Administered 2013-05-27 – 2013-05-28 (×2): 200 mg via ORAL
  Filled 2013-05-27 (×3): qty 2

## 2013-05-27 MED ORDER — SIMVASTATIN 10 MG PO TABS
10.0000 mg | ORAL_TABLET | Freq: Every day | ORAL | Status: DC
  Administered 2013-05-27 – 2013-05-28 (×2): 10 mg via ORAL
  Filled 2013-05-27 (×4): qty 1

## 2013-05-27 MED ORDER — POTASSIUM CHLORIDE 10 MEQ/100ML IV SOLN
10.0000 meq | INTRAVENOUS | Status: DC
  Administered 2013-05-27 (×2): 10 meq via INTRAVENOUS

## 2013-05-27 MED ORDER — POTASSIUM CHLORIDE CRYS ER 20 MEQ PO TBCR
40.0000 meq | EXTENDED_RELEASE_TABLET | Freq: Once | ORAL | Status: AC
  Administered 2013-05-27: 40 meq via ORAL
  Filled 2013-05-27: qty 2

## 2013-05-27 MED ORDER — POTASSIUM CHLORIDE 10 MEQ/100ML IV SOLN
10.0000 meq | INTRAVENOUS | Status: AC
  Administered 2013-05-27: 10 meq via INTRAVENOUS
  Filled 2013-05-27 (×4): qty 100

## 2013-05-27 NOTE — Progress Notes (Signed)
ANTICOAGULATION CONSULT NOTE - Follow Up Consult  Pharmacy Consult for Heparin Indication: STEMI  Allergies  Allergen Reactions  . Codeine Nausea And Vomiting  . Hydrocodone Nausea And Vomiting  . Mobic [Meloxicam] Other (See Comments)    Heart racing  . Morphine Nausea And Vomiting  . Prednisone     Keeps her awake  . Procaine Hcl   . Statins Other (See Comments)    Leg cramps lipitor only    Patient Measurements: Height: 5\' 5"  (165.1 cm) Weight: 159 lb 9.8 oz (72.4 kg) IBW/kg (Calculated) : 57 Heparin Dosing Weight:  70 kg  Vital Signs: Temp: 97.8 F (36.6 C) (04/08 0714) Temp src: Oral (04/08 0714) BP: 130/71 mmHg (04/08 0700) Pulse Rate: 88 (04/08 0700)  Labs:  Recent Labs  05/24/13 2345 05/25/13 0056 05/25/13 0511 05/25/13 1132  05/25/13 1821 05/26/13 0420 05/26/13 1505 05/27/13 0320  HGB  --  11.5*  --   --   --   --  10.8*  --  11.9*  HCT  --  34.7*  --   --   --   --  32.9*  --  35.3*  PLT  --  245  --   --   --   --  201  --  207  HEPARINUNFRC  --   --   --   --   < > 0.39 0.42  --  0.22*  CREATININE  --  0.59  --   --   --   --  0.46* 0.46* 0.57  TROPONINI 7.10*  --  >20.00* >20.00*  --   --   --   --   --   < > = values in this interval not displayed.  Estimated Creatinine Clearance: 63.4 ml/min (by C-G formula based on Cr of 0.57).   Assessment: 73 y.o. F who continues on heparin for STEMI, s/p cath that revealed distal circumflex and PDA occlusions - unable to do PCI. Heparin level is slightly SUBtherapeutic this morning (HL 0.22 << 0.42, goal of 0.3-0.7). Hgb/Hct/Plt stable. No overt s/sx of bleeding noted. Noted plans per cards for medical management and likely d/c of heparin when the patient transfers out of the ICU to a tele floor. Will f/u.  Goal of Therapy:  Heparin level 0.3-0.7 units/ml Monitor platelets by anticoagulation protocol: Yes   Plan:  1. Increase heparin drip rate to 1150 units/hr (11.5 ml/hr)  2. Will continue to monitor  for any signs/symptoms of bleeding and will follow up with heparin level in 8 hours   Alycia Rossetti, PharmD, BCPS Clinical Pharmacist Pager: [phone] 05/27/2013 8:44 AM

## 2013-05-27 NOTE — Progress Notes (Signed)
CARDIAC REHAB PHASE I   PRE:  Rate/Rhythm: 82 SR  BP:  Supine:   Sitting: 103/58  Standing:    SaO2: 93 2L 95 RA  MODE:  Ambulation: 300 ft   POST:  Rate/Rhythm: 92 SR  BP:  Supine:   Sitting: 92/52  Standing:    SaO2: 95 RA 1100-1135 Assisted X  1 and used walker to ambulate. Gait steady with walker. Pt c/o of no sleep last night and weakness. She was able to walk 300 feet without c/o of cp or SOB. VS stable pt to recliner after walk, O2 left off. Started MI education with pt. I gave her MI booklet. Pt has been in [facility] in [place] since 2008, now in maintenance program. She is willing to go back to and understands that she will have to go back to Phase 2 program after MI. We will continue to follow pt.  Rodney Langton RN 05/27/2013 11:36 AM

## 2013-05-27 NOTE — Progress Notes (Signed)
Patient Name: [name] Date of Encounter: 05/27/2013  Principal Problem:   STEMI (ST elevation myocardial infarction) Active Problems:   VT (ventricular tachycardia)   HYPERLIPIDEMIA   HYPERTENSION   G E R D   Hypothyroid   Hypokalemia    Patient Profile: 73 yo female w/ hx MI (spasm, Takotsubo), breast CA, HTN, HL, hypothyroid, admitted w/ STEMI 04/05 from [place]. Culprit vessel CFX, unsuccessful PCI. 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 EF 35-40% (previously recovered to 50-55% after Takotsubo)   SUBJECTIVE: Doing better, no chest pain, weak and with DOE but greatly improved. Nausea has finally improved.  OBJECTIVE Filed Vitals:   05/27/13 0500 05/27/13 0600 05/27/13 0700 05/27/13 0714  BP: 124/58 117/65 130/71   Pulse: 81 80 88   Temp:    97.8 F (36.6 C)  TempSrc:    Oral  Resp: 28 22 28    Height:      Weight:      SpO2: 94% 95% 94%     Intake/Output Summary (Last 24 hours) at 05/27/13 0746 Last data filed at 05/27/13 0700  Gross per 24 hour  Intake 2241.5 ml  Output   3955 ml  Net -1713.5 ml   Filed Weights   05/24/13 2245 05/26/13 0300  Weight: 154 lb 12.2 oz (70.2 kg) 159 lb 9.8 oz (72.4 kg)    PHYSICAL EXAM General: Well developed, well nourished, female in no acute distress. Head: Normocephalic, atraumatic.  Neck: Supple without bruits, JVD slightly elevated. Lungs:  Resp regular and unlabored, rales bases. Heart: RRR, S1, S2, no S3, S4, or murmur; no rub. Abdomen: Soft, non-tender, non-distended, BS + x 4.  Extremities: No clubbing, cyanosis, no edema.  Neuro: Alert and oriented X 3. Moves all extremities spontaneously. Psych: Normal affect.  LABS: CBC: Recent Labs  05/26/13 0420 05/27/13 0320  WBC 11.8* 11.8*  HGB 10.8* 11.9*  HCT 32.9* 35.3*  MCV 78.1 76.9*  PLT 201 185   Basic Metabolic Panel: Recent Labs  05/25/13 0056  05/26/13 1505 05/27/13 0320  NA 139  < > 128* 140  K 3.5*  < > 3.6* 3.1*  CL 100  < > 91* 102    CO2 24  < > 22 22  GLUCOSE 153*  < > 114* 116*  BUN 19  < > 8 7  CREATININE 0.59  < > 0.46* 0.57  CALCIUM 8.7  < > 8.7 8.9  MG 2.0  --  1.8  --   < > = values in this interval not displayed.  Cardiac Enzymes: Recent Labs  05/24/13 2345 05/25/13 0511 05/25/13 1132  TROPONINI 7.10* >20.00* >20.00*   Thyroid Function Tests: Recent Labs  05/25/13 1132  TSH 0.271*    TELE:  SR, occasional single PVCs      Current Medications:  . amiodarone  200 mg Oral Daily  . aspirin EC  81 mg Oral QHS  . carvedilol  3.125 mg Oral BID WC  . isosorbide mononitrate  15 mg Oral Daily  . levothyroxine  75 mcg Oral QAC breakfast  . magnesium oxide  400 mg Oral Daily  . pantoprazole  40 mg Oral Daily  . potassium chloride  10 mEq Intravenous Q1 Hr x 4  . Ticagrelor  90 mg Oral BID   . sodium chloride 75 mL/hr at 05/27/13 0700  . heparin 1,000 Units/hr (05/27/13 0700)  . nitroGLYCERIN Stopped (05/26/13 0830)    ASSESSMENT AND PLAN: Principal Problem:   STEMI (  ST elevation myocardial infarction) - day #3 inferolateral STEMI with unsuccessful intervention secondary to vessel tortuosity. On ASA, BB, statin, Brilinta. MD advise if OK to d/c heparin as she is 48 hr out.  Active Problems:   VT (ventricular tachycardia) - improved, if pt has more nausea after Rx, would consider d/c amiodarone, on BB    Hypotension - beginning to improve, reason for no ACE, needs BB more now, will increase Coreg to 6.25 mg and follow. Add Lisinopril next if BP still OK.    HYPERLIPIDEMIA - Has not tolerated statins very well in the past, PTA on Pravachol 20, will restart. Ck LFTs in am     HYPERTENSION - see above, PTA was on Coreg 12.5 bid.    G E R D - on PPI    Hypothyroid - TSH is low, Synthroid dose decreased 04/07, f/u with primary MD.    Hypokalemia - po intake has been poor, pt getting IVF, will change to [facility], she is drinking well and tolerated PO KDUR this am. S/p 10 meq IV and 40 meq PO, MD advise  if more is needed.     ICM - on BB, no ACE yet due to low BP, add daily weights.  Plan - continue cardiac rehab, d/c IVF, transfer to either telemetry or stepdown. Continue to ambulate consider d/c in am if does well.   Lemont Fillers , PA-C 7:46 AM 05/27/2013 Beeper [phone]   Agree with note by Rosaria Ferries PA-C. POD # 3 Inferolateral STEMI with unsuccessful attempt at crossing distal torturous ND LCX. No further CP or arrhythmias. Looks great. Eating and ambulating. Off of IV meds. Agree with D/C amio, titrate BB and add ACE-I as BP tolerated. Exam benign. Can transfer to tele this AM. K replete. Home next 24-48 hours. [facility].  Lorretta Harp, M.D., [place], [facility], Laverta [place] [address] [place], [place]  [address]  [phone] 05/27/2013 9:37 AM

## 2013-05-27 NOTE — Progress Notes (Signed)
Called and attempted to give report to RN who will be taking patient in room 3W05. She was busy at the moment so she needed to call back. Report has now been given. Will be transferring patient to room, with telemetry via wheelchair. Will continue to monitor. Richardean Sale, RN

## 2013-05-28 ENCOUNTER — Encounter (HOSPITAL_COMMUNITY)
Admission: EM | Disposition: A | Discharge status: Home / Self Care | Payer: Self-pay | Source: Home / Self Care | Attending: Cardiovascular Disease

## 2013-05-28 DIAGNOSIS — R0602 Shortness of breath: Secondary | ICD-10-CM

## 2013-05-28 LAB — HEPATIC FUNCTION PANEL
ALT: 21 U/L (ref 0–35)
AST: 25 U/L (ref 0–37)
Albumin: 2.4 g/dL — ABNORMAL LOW (ref 3.5–5.2)
Alkaline Phosphatase: 90 U/L (ref 39–117)
Total Bilirubin: 0.5 mg/dL (ref 0.3–1.2)
Total Protein: 6.1 g/dL (ref 6.0–8.3)

## 2013-05-28 LAB — BASIC METABOLIC PANEL
BUN: 14 mg/dL (ref 6–23)
CO2: 19 mEq/L (ref 19–32)
CREATININE: 0.66 mg/dL (ref 0.50–1.10)
Calcium: 9 mg/dL (ref 8.4–10.5)
Chloride: 104 mEq/L (ref 96–112)
GFR calc Af Amer: >90 mL/min (ref >90)
GFR calc non Af Amer: 86 mL/min — ABNORMAL LOW (ref >90)
Glucose, Bld: 104 mg/dL — ABNORMAL HIGH (ref 70–99)
Potassium: 4 mEq/L (ref 3.7–5.3)
Sodium: 139 mEq/L (ref 137–147)

## 2013-05-28 LAB — PRO B NATRIURETIC PEPTIDE: Pro B Natriuretic peptide (BNP): 2944 pg/mL — ABNORMAL HIGH (ref 0–125)

## 2013-05-28 SURGERY — LEFT AND RIGHT HEART CATHETERIZATION WITH CORONARY ANGIOGRAM
Anesthesia: LOCAL

## 2013-05-28 MED ORDER — POLYETHYLENE GLYCOL 3350 17 G PO PACK
17.0000 g | PACK | Freq: Two times a day (BID) | ORAL | Status: DC | PRN
  Administered 2013-05-28: 17 g via ORAL
  Filled 2013-05-28: qty 1

## 2013-05-28 MED ORDER — CARVEDILOL 12.5 MG PO TABS
12.5000 mg | ORAL_TABLET | Freq: Two times a day (BID) | ORAL | Status: DC
  Administered 2013-05-28 – 2013-05-29 (×2): 12.5 mg via ORAL
  Filled 2013-05-28 (×4): qty 1

## 2013-05-28 NOTE — Progress Notes (Signed)
CARDIAC REHAB PHASE I   PRE:  Rate/Rhythm: 78 SR  BP:  Supine: 90/60  Sitting:   Standing:    SaO2: 95 RA  MODE:  Ambulation: 510 ft   POST:  Rate/Rhythm: 78  BP:  Supine:   Sitting: 90/64  Standing:    SaO2: 988 RA 1145-1220 Assisted X 1 to ambulate with hand held assist. Gait steady. Pt able to walk 510 feet without c/o of cp or SOB. VS stable. Pt to recliner after walk with call light in reach. States that she is feeling better. Pt more talkative today.  Rodney Langton RN 05/28/2013 1:24 PM

## 2013-05-28 NOTE — Care Management Note (Signed)
    Page 1 of 1   05/28/2013     2:48:59 PM   CARE MANAGEMENT NOTE 05/28/2013  Patient:  [name], [name]   Account Number:  [id number]  Date Initiated:  05/25/2013  Documentation initiated by:  [facility]  Subjective/Objective Assessment:   STEMI     Action/Plan:   Anticipated DC Date:  05/29/2013   Anticipated DC Plan:  [place]  CM consult      Choice offered to / List presented to:             Status of service:  In process, will continue to follow Medicare Important Message given?   (If response is "NO", the following Medicare IM given date fields will be blank) Date Medicare IM given:   Date Additional Medicare IM given:    Discharge Disposition:    Per UR Regulation:  Reviewed for med. necessity/level of care/duration of stay  If discussed at [place] of Stay Meetings, dates discussed:    Comments:  ContactBraylon, [place] [phone]   [phone]  05/28/13 1430- Marvetta Gibbons RN, BSN [phone] Noted that pt started on Brilinta- benefits check submitted- pt is covered 30 day $48.00 //90 day $99.00 // no auth required--[facility]-----in to speak with pt and spouse at bedside- per conversation pt states that MD is doing medication adjustments today- not sure if she is going home on Brilinta- explained benefit coverage for Brilinta to pt- and that if she did we would give her 30 day free card- pt uses independent pharmacy in [facility] called [facility] [phone]) will f/u in am regarding plans for Brilinta.

## 2013-05-28 NOTE — Progress Notes (Signed)
Patient Name: [name] Date of Encounter: 05/28/2013  Principal Problem:   STEMI (ST elevation myocardial infarction) Active Problems:   HYPERLIPIDEMIA   HYPERTENSION   G E R D   Hypothyroid   VT (ventricular tachycardia)   Hypokalemia   Ischemic cardiomyopathy   Hypotension    Patient Profile: 73 yo female w/ hx MI (spasm, Takotsubo), breast CA, HTN, HL, hypothyroid, admitted w/ STEMI 04/05 from [place]. Culprit vessel CFX, unsuccessful PCI. 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 EF 35-40% (previously recovered to 50-55% after Takotsubo)   SUBJECTIVE: She denies further CP, but she now states that she has had more SOB over the last 24 hrs. She denies palpitations, dizziness, diaphoresis.   OBJECTIVE Filed Vitals:   05/27/13 1212 05/27/13 1300 05/27/13 2100 05/28/13 0448  BP:  103/70 118/76 117/59  Pulse:  82 82 73  Temp: 98.7 F (37.1 C)  99.7 F (37.6 C) 99.1 F (37.3 C)  TempSrc: Oral  Oral Oral  Resp:  27 22 20   Height:      Weight:    163 lb 3.2 oz (74.027 kg)  SpO2:  92% 92% 96%    Intake/Output Summary (Last 24 hours) at 05/28/13 0842 Last data filed at 05/27/13 1848  Gross per 24 hour  Intake  751.5 ml  Output    660 ml  Net   91.5 ml   Filed Weights   05/26/13 0300 05/27/13 0900 05/28/13 0448  Weight: 159 lb 9.8 oz (72.4 kg) 158 lb 11.7 oz (72 kg) 163 lb 3.2 oz (74.027 kg)    PHYSICAL EXAM General: Well developed, well nourished, female in no acute distress. Head: Normocephalic, atraumatic.  Neck: Supple without bruits, JVD slightly elevated. Lungs:  Resp regular and unlabored, rales bases. Heart: RRR, S1, S2, no S3, S4, or murmur; no rub. Abdomen: Soft, non-tender, non-distended, BS + x 4.  Extremities: No clubbing, cyanosis, no edema.  Neuro: Alert and oriented X 3. Moves all extremities spontaneously. Psych: Normal affect.  LABS: CBC:  Recent Labs  05/26/13 0420 05/27/13 0320  WBC 11.8* 11.8*  HGB 10.8* 11.9*  HCT 32.9*  35.3*  MCV 78.1 76.9*  PLT 201 237   Basic Metabolic Panel:  Recent Labs  05/26/13 1505 05/27/13 0320 05/28/13 0620  NA 128* 140 139  K 3.6* 3.1* 4.0  CL 91* 102 104  CO2 22 22 19   GLUCOSE 114* 116* 104*  BUN 8 7 14   CREATININE 0.46* 0.57 0.66  CALCIUM 8.7 8.9 9.0  MG 1.8  --   --     Cardiac Enzymes:  Recent Labs  05/25/13 1132  TROPONINI >20.00*   Thyroid Function Tests:  Recent Labs  05/25/13 1132  TSH 0.271*    TELE:  SR, occasional single PVCs      Current Medications:  . aspirin EC  81 mg Oral QHS  . carvedilol  6.25 mg Oral BID WC  . docusate sodium  200 mg Oral QHS  . fluticasone  2 spray Each Nare Daily  . isosorbide mononitrate  15 mg Oral Daily  . levothyroxine  75 mcg Oral QAC breakfast  . loratadine  10 mg Oral Daily  . magnesium oxide  400 mg Oral Daily  . pantoprazole  40 mg Oral Daily  . simvastatin  10 mg Oral q1800  . Ticagrelor  90 mg Oral BID      ASSESSMENT AND PLAN: Principal Problem:   STEMI (ST elevation myocardial infarction)  Active Problems:   HYPERLIPIDEMIA   HYPERTENSION   G E R D   Hypothyroid   VT (ventricular tachycardia)   Hypokalemia   Ischemic cardiomyopathy   Hypotension     1. STEMI: - day #4 inferolateral STEMI with unsuccessful intervention secondary to vessel tortuosity. On ASA, BB, statin, Brilinta and nitrate.    2. VT (ventricular tachycardia): Some NSVT noted on telemetry. Currently NSR. Amiodarone was discontinued. Currently on 6.25 mg of Coreg.    3. ICM: EF 35-40% on cath. on BB, ACE has been on hold due to low BPs. If hypotension continues to prevent initiation of ACE inhibitor before discharge, then will need to initiated as an OP.  Continue daily weights.    4. Hypotension : beginning to improve, reason for no ACE. She has tolerated the increase in Coreg to 6.25 mg.   5. HYPERLIPIDEMIA: Has not tolerated statins very well in the past. Pt is now on simvastatin. Hepatic function test was ok.      6.  HYPERTENSION: Controled with issues of hypotension.    7.  G E R D:  on PPI   8. Hypothyroid : TSH is low, Synthroid dose decreased 04/07, f/u with primary MD.   9.  Hypokalemia: resolved.  10: SOB: will see how patient tolerates cardiac rehab today. ? If due to CHF, NSVT or Brilinta. She does not appear volume overloaded. If BP improves, she may need a PRN low dose diuretic to use at home, in the setting of reduced systolic function.   Signed, Lyda Jester , PA-C 8:42 AM 05/28/2013    Agree with note written by Ellen Henri  PAC  Day #4 Inferolateral wall STEMI with unsuccessful intervention secondary to vessel tortuosity. DOing well. Occasional PVCs. VSS . Exam benign. C/O some SOB. Labs OK. WIll D/C Brilenta (no stent placed), and increase Coreg to 12.5 mg PO BID. Ambulate with [facility]. Check 2D today. Prob home tomorrow.   Lorretta Harp, M.D., [place], [facility], Laverta [place] [address] [place], [place]  [address]  [phone] 05/28/2013 11:03 AM   Lorretta Harp 05/28/2013 11:01 AM

## 2013-05-29 ENCOUNTER — Other Ambulatory Visit: Payer: Self-pay | Admitting: Physician Assistant

## 2013-05-29 DIAGNOSIS — R0989 Other specified symptoms and signs involving the circulatory and respiratory systems: Secondary | ICD-10-CM

## 2013-05-29 DIAGNOSIS — R0609 Other forms of dyspnea: Secondary | ICD-10-CM

## 2013-05-29 DIAGNOSIS — E876 Hypokalemia: Secondary | ICD-10-CM

## 2013-05-29 MED ORDER — FLEET ENEMA 7-19 GM/118ML RE ENEM
1.0000 | ENEMA | Freq: Once | RECTAL | Status: AC
  Administered 2013-05-29: 1 via RECTAL
  Filled 2013-05-29: qty 1

## 2013-05-29 MED ORDER — ISOSORBIDE MONONITRATE ER 30 MG PO TB24: 15.0000 mg | ORAL_TABLET | Freq: Every day | ORAL | Status: DC

## 2013-05-29 MED ORDER — FUROSEMIDE 20 MG PO TABS: 20.0000 mg | ORAL_TABLET | Freq: Every day | ORAL | Status: AC

## 2013-05-29 MED ORDER — NITROGLYCERIN 0.4 MG SL SUBL: 0.4000 mg | SUBLINGUAL_TABLET | SUBLINGUAL | Status: AC | PRN

## 2013-05-29 MED ORDER — LEVOTHYROXINE SODIUM 75 MCG PO TABS: 75.0000 ug | ORAL_TABLET | Freq: Every day | ORAL | Status: AC

## 2013-05-29 MED ORDER — POTASSIUM CHLORIDE ER 10 MEQ PO TBCR: 10.0000 meq | EXTENDED_RELEASE_TABLET | Freq: Every day | ORAL | Status: DC

## 2013-05-29 MED ORDER — CARVEDILOL 12.5 MG PO TABS: 12.5000 mg | ORAL_TABLET | Freq: Two times a day (BID) | ORAL | Status: DC

## 2013-05-29 NOTE — Progress Notes (Signed)
Echo Lab  2D Echocardiogram completed.  [place], RDCS 05/29/2013 9:51 AM

## 2013-05-29 NOTE — Discharge Instructions (Signed)
PLEASE REMEMBER TO BRING ALL OF YOUR MEDICATIONS TO EACH OF YOUR FOLLOW-UP OFFICE VISITS. ° °PLEASE ATTEND ALL SCHEDULED FOLLOW-UP APPOINTMENTS.  ° °Activity: Increase activity slowly as tolerated. You may shower, but no soaking baths (or swimming) for 1 week. No driving for 1 week. No lifting over 5 lbs for 2 weeks. No sexual activity for 1 week.  ° °You May Return to Work: in 3 weeks (if applicable) ° °Wound Care: You may wash cath site gently with soap and water. Keep cath site clean and dry. If you notice pain, swelling, bleeding or pus at your cath site, please call [phone]. ° ° ° °Cardiac Cath Site Care °Refer to this sheet in the next few weeks. These instructions provide you with information on caring for yourself after your procedure. Your caregiver may also give you more specific instructions. Your treatment has been planned according to current medical practices, but problems sometimes occur. Call your caregiver if you have any problems or questions after your procedure. °HOME CARE INSTRUCTIONS °· You may shower 24 hours after the procedure. Remove the bandage (dressing) and gently wash the site with plain soap and water. Gently pat the site dry.  °· Do not apply powder or lotion to the site.  °· Do not sit in a bathtub, swimming pool, or whirlpool for 5 to 7 days.  °· No bending, squatting, or lifting anything over 10 pounds (4.5 kg) as directed by your caregiver.  °· Inspect the site at least twice daily.  °· Do not drive home if you are discharged the same day of the procedure. Have someone else drive you.  °· You may drive 24 hours after the procedure unless otherwise instructed by your caregiver.  °What to expect: °· Any bruising will usually fade within 1 to 2 weeks.  °· Blood that collects in the tissue (hematoma) may be painful to the touch. It should usually decrease in size and tenderness within 1 to 2 weeks.  °SEEK IMMEDIATE MEDICAL CARE IF: °· You have unusual pain at the site or down the  affected limb.  °· You have redness, warmth, swelling, or pain at the site.  °· You have drainage (other than a small amount of blood on the dressing).  °· You have chills.  °· You have a fever or persistent symptoms for more than 72 hours.  °· You have a fever and your symptoms suddenly get worse.  °· Your leg becomes pale, cool, tingly, or numb.  °· You have heavy bleeding from the site. Hold pressure on the site.  °Document Released: 03/10/2010 Document Revised: 01/25/2011 Document Reviewed:  ° °

## 2013-05-29 NOTE — Progress Notes (Signed)
CARDIAC REHAB PHASE I   PRE:  Rate/Rhythm: 75 SR  BP:  Supine:   Sitting: 96/60  Standing:    SaO2: 95 RA  MODE:  Ambulation: 550 ft   POST:  Rate/Rhythm: 80  BP:  Supine:   Sitting: 112/70  Standing:    SaO2: 97 RA 0955-1055 Pt tolerated ambulation well without c/o of cp or SOB. VS stable Pt to recliner after walk with call light in reach. Completed discharge education with pt and husband. We discussed CHF, signs and symptoms, daily weights, sodium restrictions and when to call MD or 911. Pt voices understanding.  Rodney Langton RN 05/29/2013 10:54 AM

## 2013-05-29 NOTE — Discharge Summary (Signed)
CARDIOLOGY DISCHARGE SUMMARY   Patient ID: [name] MRN: [id number] DOB/AGE: 1941-01-11 73 y.o.  Admit date: 05/24/2013 Discharge date: 05/29/2013  PCP: Rusty Aus., MD Primary Cardiologist:  Dr. Rockey Situ  Primary Discharge Diagnosis: Inferolateral  STEMI (ST elevation myocardial infarction) - unsuccessful PCI of circumflex, medical therapy   Secondary Discharge Diagnosis:    VT (ventricular tachycardia)   Hypotension   HYPERLIPIDEMIA   HYPERTENSION   G E R D   Hypothyroid   Hypokalemia   Ischemic cardiomyopathy  Procedures: Cardiac catheterization, coronary arteriogram, left ventriculogram, Unsuccessful attempt at percutaneous revascularization, 2-D echocardiogram   Hospital Course: [name] is a 73 y.o. female with a history of  an MI with Takotsubo cardiomyopathy, EF subsequently recovered. She had onset of chest pain and went to the [facility] emergency room where her ECG showed inferior ST elevation. She was treated appropriately and transferred to [facility]. She was taken directly to the cath lab.  Cardiac catheterization results are below. The culprit vessel was felt to be the circumflex and percutaneous intervention was attempted but unsuccessful due to vessel tortuosity. There was significant stenosis in the PDA, but the vessel is small with a long area of plaque and it was felt that she should initially be treated medically for this, consider staged intervention.  She continued to have chest pain and her medications were up-titrated as her blood pressure would allow. She was on heparin, IV nitroglycerin, and was given fentanyl when necessary. She also had significant nausea and vomiting, requiring Zofran and Phenergan for control.  She developed nonsustained VT overnight and had multiple episodes, some of them prolonged. She was started on IV amiodarone. Her electrolytes were followed closely and she had some problems with hypokalemia. This was supplemented and  improved. Her magnesium was 1.8 and she was given an oral supplementation but this would not be continued at discharge. The nonsustained VT improved but she had significant nausea. The amiodarone was changed from IV to by mouth but the nausea continued. Her beta blocker could not be increased because of hypotension. Her volume status was watched carefully and IV fluid was given as needed because her by mouth intake was poor, but amount was limited to prevent volume overload. She initially improved but then developed more problems with nausea and the amiodarone was felt responsible, so this was discontinued.   After 48 hours, the chest pain finally resolved. The films were reviewed and it was decided not to attempt PCI of the PDA at this time since the vessel was small and the intervention would've been difficult. Initial medical management was felt best and she was on aspirin, a beta blocker and a low dose of Imdur. Because of the nonsustained VT, and her inability to tolerate amiodarone, her beta blocker was increased as her blood pressure and heart rate would tolerate. She is not currently on any ACE inhibitor as her blood pressure has been too low and preference has been given to the beta blocker because of her MI and VT. An ACE inhibitor will be added as an outpatient as she continues to improve.  She was seen by cardiac rehabilitation and educated on MI restrictions, hard-healthy lifestyle modifications and exercise guidelines. She did not have chest pain or shortness of breath with ambulation and will follow up with cardiac rehabilitation as an outpatient.  She has not tolerated statins very well in the past and one was initially not used. Liver functions were checked and were within normal  limits. She agreed to try a statin and was started on simvastatin 10 mg daily. She will get a recheck of her liver functions and hit a lipid profile in 6 weeks.  She has a history of hypothyroidism and was on  Synthroid on admission. A TSH was checked and was very low, so her Synthroid dose was reduced. She is to follow up with her primary care physician for recheck of her labs and further management.  On 4/10, she had an echocardiogram, results below. Because of her reduced EF, she will be on a diuretic with potassium supplementation. She will get a BMET at her f/u appointment. She was seen by cardiac rehabilitation and ambulated with them, making good progress. She was seen by Dr. Gwenlyn Found and all data were reviewed. No further cardiac workup is indicated at this time and she is considered stable for discharge, to follow up as an outpatient.  Labs:  Lab Results  Component Value Date   WBC 11.8* 05/27/2013   HGB 11.9* 05/27/2013   HCT 35.3* 05/27/2013   MCV 76.9* 05/27/2013   PLT 207 05/27/2013     Recent Labs Lab 05/28/13 0620  NA 139  K 4.0  CL 104  CO2 19  BUN 14  CREATININE 0.66  CALCIUM 9.0  PROT 6.1  BILITOT 0.5  ALKPHOS 90  ALT 21  AST 25  GLUCOSE 104*   Pro B Natriuretic peptide (BNP)  Date/Time Value Ref Range Status  05/28/2013  6:20 AM 2944.0* 0 - 125 pg/mL Final   Lab Results  Component Value Date   TSH 0.271* 05/25/2013    Cardiac Cath: 05/29/2013 ANGIOGRAPHIC RESULTS:  1. Left main; normal  2. LAD; normal  3. Left circumflex; nondominant with an occluded distal circumflex on a bend..  4. Right coronary artery; dominant with a long 90% segmental mid-PDA stenosis. The vessel is at most 2 mm. This was new since the prior cath in 2007.  5. Left ventriculography; RAO left ventriculogram was performed using  25 mL of Visipaque dye at 12 mL/second. The overall LVEF estimated  35-40 % Wit wall motion abnormalities notable for inferolateral/inferoapical akinesia.  IMPRESSION:[name] has an occluded distal nondominant circumflex that supplies several posterior lateral branches. In addition, she has 90% segmental mid-PDA stenosis and a 2 mm vessel that is new since her prior cath 2  years ago. Her EKG is most consistent with a circumflex occlusion. This is a very tortuous vessel. We'll proceed with attempt at percutaneous revascularization.  Procedure description:The patient received Brilenta 180 mg by mouth and Angiomax bolus and ACT of 359. Total contrast used during the case was 135 cc. Using a 6 [place] XB 3 Guide catheter along with an 014/190 cm pro water guidewire and a 1.5 mm x 12 mm balloon attempts were made to cross the distal occlusion unsuccessfully because of extreme tortuosity. There was a dissection on the distal band. I do not think this is percutaneously fixable given a 260 bend and the fact that I was unable to cross the total occlusion. I believe the safest thing is to allow her to complete her inferolateral infarct and stage potential percutaneous intervention of her PDA. The the guidewire and catheter were removed and the sheath was then securely in place. The patient left the Cath Lab in stable condition. Angiomax was turned off.  EKG: 25-May-2013 06:57:00 Normal sinus rhythm Low voltage QRS Inferior infarct , age undetermined Cannot rule out Anterior infarct , age undetermined ST & T  wave abnormality, consider lateral ischemia Vent. rate 75 BPM PR interval 134 ms QRS duration 86 ms QT/QTc 402/448 ms P-R-T axes 46 -3 165  Echo: 05/29/2013 Study Conclusions Left ventricle: Systolic function was mildly to moderately reduced. The estimated ejection fraction was in the range of 40% to 45%. Akinesis of the midinferior myocardium; consistent with infarction. There was an increased relative contribution of atrial contraction to ventricular filling.  FOLLOW UP PLANS AND APPOINTMENTS Allergies  Allergen Reactions  . Codeine Nausea And Vomiting  . Hydrocodone Nausea And Vomiting  . Mobic [Meloxicam] Other (See Comments)    Heart racing  . Morphine Nausea And Vomiting  . Prednisone     Keeps her awake  . Procaine Hcl   . Statins Other (See Comments)     Leg cramps lipitor only     Medication List         acetaminophen 500 MG tablet  Commonly known as:  TYLENOL  Take 1,000 mg by mouth every 6 (six) hours as needed for moderate pain.     aspirin 81 MG EC tablet  Take 81 mg by mouth at bedtime.     B-12 TR 1000 MCG Tbcr  Generic drug:  Cyanocobalamin  Take 1,000 mcg by mouth daily.     CALTRATE 600+D 600-400 MG-UNIT per tablet  Generic drug:  Calcium Carbonate-Vitamin D  Take 1 tablet by mouth 2 (two) times daily with a meal.     carvedilol 12.5 MG tablet  Commonly known as:  COREG  Take 1 tablet (12.5 mg total) by mouth 2 (two) times daily with a meal.     diazepam 5 MG tablet  Commonly known as:  VALIUM  Take 5 mg by mouth every 12 (twelve) hours as needed for anxiety.     docusate sodium 100 MG capsule  Commonly known as:  COLACE  Take 200 mg by mouth at bedtime.     esomeprazole 40 MG capsule  Commonly known as:  NEXIUM  Take 40 mg by mouth daily before breakfast.     fexofenadine 180 MG tablet  Commonly known as:  ALLEGRA  Take 180 mg by mouth daily.     Flax Seeds Powd  Take 30 mLs by mouth daily before breakfast. 2 tablespoonful     furosemide 20 MG tablet  Commonly known as:  LASIX  Take 1 tablet (20 mg total) by mouth daily.     isosorbide mononitrate 30 MG 24 hr tablet  Commonly known as:  IMDUR  Take 0.5 tablets (15 mg total) by mouth daily.     levothyroxine 75 MCG tablet  Commonly known as:  SYNTHROID, LEVOTHROID  Take 1 tablet (75 mcg total) by mouth daily before breakfast.     magnesium oxide 400 MG tablet  Commonly known as:  MAG-OX  Take 400 mg by mouth daily with supper.     meclizine 25 MG tablet  Commonly known as:  ANTIVERT  Take 25 mg by mouth 2 (two) times daily as needed for dizziness.     NASONEX 50 MCG/ACT nasal spray  Generic drug:  mometasone  Place 2 sprays into the nose at bedtime.     nitroGLYCERIN 0.4 MG SL tablet  Commonly known as:  NITROSTAT  Place 1 tablet  (0.4 mg total) under the tongue every 5 (five) minutes as needed for chest pain. May repeat three times     potassium chloride 10 MEQ tablet  Commonly known as:  K-DUR  Take 1  tablet (10 mEq total) by mouth daily.     pravastatin 20 MG tablet  Commonly known as:  PRAVACHOL  Take 20 mg by mouth at bedtime.     ranitidine 150 MG tablet  Commonly known as:  ZANTAC  Take 150 mg by mouth every evening.     traZODone 50 MG tablet  Commonly known as:  DESYREL  Take 50 mg by mouth at bedtime.     Vitamin D 1000 UNITS capsule  Take 1,000 Units by mouth daily.        Discharge Orders   Future Appointments Provider Department Dept Phone   06/08/2013 2:15 PM Minna Merritts, MD [place] [phone]   10/13/2013 12:00 PM [facility] [phone]   Liquids only 4 hours prior to your exam. Any medications can be taken as usual. Please arrive 15 min prior to your scheduled exam time.   10/13/2013 12:30 PM [facility] [phone]   Liquids only 4 hours prior to your exam. Any medications can be taken as usual. Please arrive 15 min prior to your scheduled exam time.   Future Orders Complete By Expires   Diet - low sodium heart healthy  As directed    Increase activity slowly  As directed        BRING ALL MEDICATIONS WITH YOU TO FOLLOW UP APPOINTMENTS  Time spent with patient to include physician time: 55 min Signed: Lonn Georgia, PA-C 05/29/2013, 1:15 PM Co-Sign MD

## 2013-05-29 NOTE — Progress Notes (Signed)
Patient Name: [name] Date of Encounter: 05/29/2013  Principal Problem:   STEMI (ST elevation myocardial infarction) Active Problems:   HYPERLIPIDEMIA   HYPERTENSION   G E R D   Hypothyroid   VT (ventricular tachycardia)   Hypokalemia   Ischemic cardiomyopathy   Hypotension    Patient Profile: 73 yo female w/ hx MI (spasm, Takotsubo), breast CA, HTN, HL, hypothyroid, admitted w/ STEMI 04/05 from [facility]. Culprit vessel CFX, unsuccessful PCI. 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 EF 35-40% (previously recovered to 50-55% after Takotsubo)   SUBJECTIVE: She states that breathing is slightly better this am, although she has not ambulated much yet today. She denies CP and palpitations.   OBJECTIVE Filed Vitals:   05/28/13 1316 05/28/13 1715 05/28/13 2015 05/29/13 0438  BP: 108/72 128/72 101/69 129/75  Pulse: 76 82 95 75  Temp: 97.9 F (36.6 C)  98 F (36.7 C) 98.3 F (36.8 C)  TempSrc: Axillary  Oral Oral  Resp: 18  18 18   Height:      Weight:    165 lb 1.6 oz (74.889 kg)  SpO2: 96%  94% 96%    Intake/Output Summary (Last 24 hours) at 05/29/13 0757 Last data filed at 05/28/13 1700  Gross per 24 hour  Intake    480 ml  Output      0 ml  Net    480 ml   Filed Weights   05/27/13 0900 05/28/13 0448 05/29/13 0438  Weight: 158 lb 11.7 oz (72 kg) 163 lb 3.2 oz (74.027 kg) 165 lb 1.6 oz (74.889 kg)    PHYSICAL EXAM General: Well developed, well nourished, female in no acute distress. Head: Normocephalic, atraumatic.  Neck: Supple without bruits, JVD slightly elevated. Lungs:  Resp regular and unlabored, rales bases. Heart: RRR, S1, S2, no S3, S4, or murmur; no rub. Abdomen: Soft, non-tender, non-distended, BS + x 4.  Extremities: No clubbing, cyanosis, no edema.  Neuro: Alert and oriented X 3. Moves all extremities spontaneously. Psych: Normal affect.  LABS: CBC:  Recent Labs  05/27/13 0320  WBC 11.8*  HGB 11.9*  HCT 35.3*  MCV 76.9*  PLT  606   Basic Metabolic Panel:  Recent Labs  05/26/13 1505 05/27/13 0320 05/28/13 0620  NA 128* 140 139  K 3.6* 3.1* 4.0  CL 91* 102 104  CO2 22 22 19   GLUCOSE 114* 116* 104*  BUN 8 7 14   CREATININE 0.46* 0.57 0.66  CALCIUM 8.7 8.9 9.0  MG 1.8  --   --     Cardiac Enzymes: No results found for this basename: CKTOTAL, CKMB, CKMBINDEX, TROPONINI,  in the last 72 hours Thyroid Function Tests: No results found for this basename: TSH, T4TOTAL, FREET3, T3FREE, THYROIDAB,  in the last 72 hours  TELE:  SR, occasional single PVCs, one 13 beat run of VT     Current Medications:  . aspirin EC  81 mg Oral QHS  . carvedilol  12.5 mg Oral BID WC  . docusate sodium  200 mg Oral QHS  . fluticasone  2 spray Each Nare Daily  . isosorbide mononitrate  15 mg Oral Daily  . levothyroxine  75 mcg Oral QAC breakfast  . loratadine  10 mg Oral Daily  . magnesium oxide  400 mg Oral Daily  . pantoprazole  40 mg Oral Daily  . simvastatin  10 mg Oral q1800      ASSESSMENT AND PLAN: Principal Problem:  STEMI (ST elevation myocardial infarction) Active Problems:   HYPERLIPIDEMIA   HYPERTENSION   G E R D   Hypothyroid   VT (ventricular tachycardia)   Hypokalemia   Ischemic cardiomyopathy   Hypotension     1. STEMI: - day #5 inferolateral STEMI with unsuccessful intervention secondary to vessel tortuosity. No stent was placed. Will continue medical therapy with ASA, BB,statin and nitrate. She denies any further CP.   2. VT (ventricular tachycardia): Telemetry shows one 13 beat run of NSVT on telemetry this am. Pt was asymptomatic. Currently NSR w/ HR in the 70s. Coreg was increased yesterday to 12.5 mg BID. ? If we should restart low dose Amiodarone. Will await 2D echo results. If EF is < 35%, then we may need to consider a LifeVest, as she has had evidence of VT in the setting of STEMI and systolic dysfunction.     3. ICM: EF 35-40% on cath. on BB, ACE has been on hold due to low BPs. If  hypotension continues to prevent initiation of ACE inhibitor before discharge, then will need to initiated as an OP.  Continue daily weights.    4. Hypotension : beginning to improve, reason for no ACE. She has tolerated the increase in Coreg to 12.5 mg.   5. HYPERLIPIDEMIA: Has not tolerated statins very well in the past. Pt is now on simvastatin. Hepatic function test was ok.    6.  HYPERTENSION: Controled with issues of hypotension.    7.  G E R D:  on PPI   8. Hypothyroid : TSH is low, Synthroid dose decreased 04/07, f/u with primary MD.   9.  Hypokalemia: resolved.  10: SOB: Likely subsequent to systolic HF. BNP yesterday was 2,944. Pt will likely need to go home with a low dose diuretic for fluid control, if BP allows. 2D echo pending. Brilinta was also discontinued yesterday as no stent was placed.   Signed, Lyda Jester , PA-C 7:57 AM 05/29/2013    Agree with note written by Ellen Henri  [facility]  OK for DC home once 2 D resulted. No CP/SOB. Exam benign. Brilenta D/Cd. BNP moderately elevated. Will start low dose diuretic with K repletion. Can start ACE-I as an OP. ROV with Dr. Candis Musa in [place] 05/29/2013 12:06 PM

## 2013-06-01 ENCOUNTER — Telehealth: Payer: Self-pay

## 2013-06-01 NOTE — Telephone Encounter (Signed)
Faxed cardiac clearance to [facility] at [phone] that pt is cleared to proceed w/ "TKA-Medial & Lateral w/wo patella resurfacing".

## 2013-06-05 ENCOUNTER — Telehealth: Payer: Self-pay

## 2013-06-05 NOTE — Telephone Encounter (Signed)
Spoke w/ pt's husband.  He reports that pt had an MI on Easter Sunday. Dr. Sabra Heck adjusted her coreg dose and her husband reports that BP has been very low since the change.  He has a call in to Dr. Ammie Ferrier office to see about changing this, but would like Dr. Donivan Scull feedback, as well, as it is Friday afternoon and he is worried about something happening to her over the weekend.  Please advise.  Thank you.

## 2013-06-05 NOTE — Telephone Encounter (Signed)
Would decrease coreg down to 1/2 pill twice a day Monitor the blood  Call back next week with numbers

## 2013-06-05 NOTE — Telephone Encounter (Signed)
Pt is currently taking coreg 12.5 BID.

## 2013-06-05 NOTE — Telephone Encounter (Signed)
Spoke w/ pt's husband.  Advised her of Dr. Donivan Scull recommendation.  He will keep pt's appt w/ Dr. Rockey Situ on Monday.

## 2013-06-05 NOTE — Telephone Encounter (Signed)
Pt husband states pt is currently taking Coreg and 105/67, and has dropped to 94/67. Asks if he should stop the Coreg. Pt is experiencing dizziness. Pt is scheduled for appt on Monday.

## 2013-06-08 ENCOUNTER — Encounter: Payer: Self-pay | Admitting: Cardiovascular Disease

## 2013-06-08 ENCOUNTER — Telehealth: Payer: Self-pay

## 2013-06-08 ENCOUNTER — Ambulatory Visit (INDEPENDENT_AMBULATORY_CARE_PROVIDER_SITE_OTHER): Payer: Medicare Other | Admitting: Cardiovascular Disease

## 2013-06-08 VITALS — BP 102/70 | HR 78 | Ht 65.0 in | Wt 153.8 lb

## 2013-06-08 DIAGNOSIS — I2119 ST elevation (STEMI) myocardial infarction involving other coronary artery of inferior wall: Secondary | ICD-10-CM

## 2013-06-08 DIAGNOSIS — I255 Ischemic cardiomyopathy: Secondary | ICD-10-CM

## 2013-06-08 DIAGNOSIS — I219 Acute myocardial infarction, unspecified: Secondary | ICD-10-CM

## 2013-06-08 DIAGNOSIS — I213 ST elevation (STEMI) myocardial infarction of unspecified site: Secondary | ICD-10-CM

## 2013-06-08 DIAGNOSIS — I2589 Other forms of chronic ischemic heart disease: Secondary | ICD-10-CM

## 2013-06-08 DIAGNOSIS — E785 Hyperlipidemia, unspecified: Secondary | ICD-10-CM | POA: Insufficient documentation

## 2013-06-08 DIAGNOSIS — I2510 Atherosclerotic heart disease of native coronary artery without angina pectoris: Secondary | ICD-10-CM

## 2013-06-08 DIAGNOSIS — I10 Essential (primary) hypertension: Secondary | ICD-10-CM

## 2013-06-08 MED ORDER — CARVEDILOL 6.25 MG PO TABS: 6.2500 mg | ORAL_TABLET | Freq: Two times a day (BID) | ORAL | Status: AC

## 2013-06-08 NOTE — Assessment & Plan Note (Signed)
She is tolerating pravastatin 20 mg daily. We'll need to titrate upward slowly given previous statin intolerance

## 2013-06-08 NOTE — Assessment & Plan Note (Signed)
Blood pressure running low at home with symptoms of orthostasis. Coreg dose cut in half. Suggested she take low-dose isosorbide in the evening rather than at noon

## 2013-06-08 NOTE — Patient Instructions (Addendum)
You are doing well.  Please take the isosorbide at dinner or before bed Continue coreg 6.25 mg twice a day (1/2 of the 12.5 mg pill)  We will place an order for cardiac rehab at [facility]  Echocardiogram in 3 months, recent MI  Please call us if you have new issues that need to be addressed before your next appt.  Your physician wants you to follow-up in: 3 months after ECHO You will receive a reminder letter in the mail two months in advance. If you don't receive a letter, please call our office to schedule the follow-up appointment.

## 2013-06-08 NOTE — Assessment & Plan Note (Signed)
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 Medical management recommended. Currently she feels well with no significant symptoms concerning for angina

## 2013-06-08 NOTE — Progress Notes (Signed)
Patient ID: [name], female    DOB: [birth year], 73 y.o.   MRN: [id number]  HPI Comments: Mrs. Calvillo is a very pleasant 73 year old woman with history of stress-induced cardiopathy and 2013, initial decrease in her ejection fraction with improvement on subsequent echocardiograms , who presents for routine followup .  Notes indicate recent onset of chest pain and went to the [facility] emergency room where her ECG showed inferior ST elevation.  transferred to [facility].  taken directly to the cath lab.  The culprit vessel was felt to be the circumflex and percutaneous intervention was attempted but unsuccessful due to vessel tortuosity. There was significant stenosis in the PDA, but the vessel is small with a long area of plaque and it was felt that she should initially be treated medically for this.  In the hospital she had nonsustained VT overnight, started on IV amiodarone.  she had hypokalemia.  The nonsustained VT improved but she had significant nausea. The amiodarone was changed from IV to oral but the nausea continued.  amiodarone was felt responsible, so this was discontinued.   She was seen by cardiac rehabilitation and educated on MI restrictions, hard-healthy lifestyle modifications and exercise guidelines. She did not have chest pain or shortness of breath with ambulation and will follow up with cardiac rehabilitation as an outpatient.   TSH was checked and was very low, so her Synthroid dose was reduced.   In followup today,she reports having some dizziness in her head. We did receive a call last week of low blood pressures. Coreg was decreased down to 6.25 mg twice a day. She is taking isosorbide one half pill daily. Blood pressures have improved mildly over the weekend. Occasional with systolic pressure 716, rare in the 90s. Less dizziness. She is scheduled to start cardiac rehabilitation in the next 2 weeks.  Prior cardiac catheterization x2 in 2009 in March 2013.   EKG today  shows normal sinus rhythm with rate 78 beats per minute, T wave abnormality in V3 to V6, 2, 3, aVF     Outpatient Encounter Prescriptions as of 06/08/2013  Medication Sig  . acetaminophen (TYLENOL) 500 MG tablet Take 1,000 mg by mouth every 6 (six) hours as needed for moderate pain.   Marland Kitchen aspirin 81 MG EC tablet Take 81 mg by mouth at bedtime.   . Calcium Carbonate-Vitamin D (CALTRATE 600+D) 600-400 MG-UNIT per tablet Take 1 tablet by mouth 2 (two) times daily with a meal.   . carvedilol (COREG) 6.25 MG tablet Take 1 tablet (6.25 mg total) by mouth 2 (two) times daily with a meal.  . Cholecalciferol (VITAMIN D) 1000 UNITS capsule Take 1,000 Units by mouth daily.   . Cyanocobalamin (B-12 TR) 1000 MCG TBCR Take 1,000 mcg by mouth daily.   . diazepam (VALIUM) 5 MG tablet Take 5 mg by mouth every 12 (twelve) hours as needed for anxiety.  . docusate sodium (COLACE) 100 MG capsule Take 200 mg by mouth at bedtime.   Marland Kitchen esomeprazole (NEXIUM) 40 MG capsule Take 40 mg by mouth daily before breakfast.   . fexofenadine (ALLEGRA) 180 MG tablet Take 180 mg by mouth daily.   . Flaxseed, Linseed, (FLAX SEEDS) POWD Take 30 mLs by mouth daily before breakfast. 2 tablespoonful  . furosemide (LASIX) 20 MG tablet Take 1 tablet (20 mg total) by mouth daily.  . isosorbide mononitrate (IMDUR) 30 MG 24 hr tablet Take 0.5 tablets (15 mg total) by mouth daily.  Marland Kitchen levothyroxine (SYNTHROID, LEVOTHROID) 75  MCG tablet Take 1 tablet (75 mcg total) by mouth daily before breakfast.  . magnesium oxide (MAG-OX) 400 MG tablet Take 400 mg by mouth daily with supper.   . meclizine (ANTIVERT) 25 MG tablet Take 25 mg by mouth 2 (two) times daily as needed for dizziness.  . mometasone (NASONEX) 50 MCG/ACT nasal spray Place 2 sprays into the nose at bedtime.   . nitroGLYCERIN (NITROSTAT) 0.4 MG SL tablet Place 1 tablet (0.4 mg total) under the tongue every 5 (five) minutes as needed for chest pain. May repeat three times  . potassium  chloride (K-DUR) 10 MEQ tablet Take 1 tablet (10 mEq total) by mouth daily.  . pravastatin (PRAVACHOL) 20 MG tablet Take 20 mg by mouth at bedtime.    . ranitidine (ZANTAC) 150 MG tablet Take 150 mg by mouth every evening.  . traZODone (DESYREL) 50 MG tablet Take 50 mg by mouth at bedtime.   Review of Systems  Constitutional: Positive for fatigue.  Eyes: Negative.   Respiratory: Negative.   Cardiovascular: Negative.   Gastrointestinal: Negative.   Musculoskeletal: Negative.   Skin: Negative.   Allergic/Immunologic: Negative.   Neurological: Negative.   Psychiatric/Behavioral: Negative.   All other systems reviewed and are negative.   BP 102/70  Pulse 78  Ht 5\' 5"  (1.651 m)  Wt 153 lb 12 oz (69.741 kg)  BMI 25.59 kg/m2  Physical Exam  Nursing note and vitals reviewed. Constitutional: She is oriented to person, place, and time. She appears well-developed and well-nourished.  HENT:  Head: Normocephalic.  Nose: Nose normal.  Mouth/Throat: Oropharynx is clear and moist.  Eyes: Conjunctivae are normal. Pupils are equal, round, and reactive to light.  Neck: Normal range of motion. Neck supple. No JVD present.  Cardiovascular: Normal rate, regular rhythm, S1 normal, S2 normal and intact distal pulses.  Exam reveals no gallop and no friction rub.   Murmur heard.  Crescendo systolic murmur is present with a grade of 2/6  Pulmonary/Chest: Effort normal and breath sounds normal. No respiratory distress. She has no wheezes. She has no rales. She exhibits no tenderness.  Abdominal: Soft. Bowel sounds are normal. She exhibits no distension. There is no tenderness.  Musculoskeletal: Normal range of motion. She exhibits no edema and no tenderness.  Lymphadenopathy:    She has no cervical adenopathy.  Neurological: She is alert and oriented to person, place, and time. Coordination normal.  Skin: Skin is warm and dry. No rash noted. No erythema.  Psychiatric: She has a normal mood and  affect. Her behavior is normal. Judgment and thought content normal.    Assessment and Plan

## 2013-06-08 NOTE — Telephone Encounter (Signed)
Pt's husband called stating that pt was nauseous today during today's visit, but forgot to mention it to Dr. Rockey Situ. Denies chest pain, SOB or sweating.  Reports that she was nauseous after her MI, this feels different than MI-related sx. Reports that she did not eat very much lunch, as "food turns her off". Advised pt to contact PCP's office.  He states that he tried, but they are closed. Advised him that everyone has left our office, I can relay message to Dr. Rockey Situ, but issue will not be addressed until tomorrow.] Pt's husband states that he will take her to the ED. Reviewed sx again w/ him, pt continues to deny  CP, SOB, or any other sx, only nausea. Pt's husband insists this needs attention now, states he will call [facility] and take pt to ED if no answer.  Pt to call w/ further questions or concens.

## 2013-06-08 NOTE — Assessment & Plan Note (Signed)
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 Unable to stent. Medical management recommended

## 2013-06-08 NOTE — Assessment & Plan Note (Signed)
Stay on prasvatin

## 2013-06-08 NOTE — Assessment & Plan Note (Signed)
Repeat echocardiogram to evaluate depressed ejection fraction scheduled for 3 months

## 2013-06-09 ENCOUNTER — Telehealth: Payer: Self-pay

## 2013-06-09 NOTE — Telephone Encounter (Signed)
See previous phon note 06/08/13. Spoke w/ pt's husband.  He reports that pt's nausea resolved last night w/ no intervention. Reports pt slept well.  He will call the office if sx recur.

## 2013-07-03 ENCOUNTER — Encounter: Payer: Self-pay | Admitting: Nurse Practitioner

## 2013-07-03 ENCOUNTER — Inpatient Hospital Stay: Payer: Self-pay | Admitting: Internal Medicine

## 2013-07-03 DIAGNOSIS — I5023 Acute on chronic systolic (congestive) heart failure: Secondary | ICD-10-CM

## 2013-07-03 DIAGNOSIS — I2510 Atherosclerotic heart disease of native coronary artery without angina pectoris: Secondary | ICD-10-CM

## 2013-07-03 LAB — CBC
HCT: 36 % (ref 35.0–47.0)
HGB: 11.5 g/dL — ABNORMAL LOW (ref 12.0–16.0)
MCH: 25 pg — ABNORMAL LOW (ref 26.0–34.0)
MCHC: 31.8 g/dL — ABNORMAL LOW (ref 32.0–36.0)
MCV: 79 fL — ABNORMAL LOW (ref 80–100)
Platelet: 210 10*3/uL (ref 150–440)
RBC: 4.57 10*6/uL (ref 3.80–5.20)
RDW: 15.6 % — ABNORMAL HIGH (ref 11.5–14.5)
WBC: 6.6 10*3/uL (ref 3.6–11.0)

## 2013-07-03 LAB — BASIC METABOLIC PANEL
Anion Gap: 7 (ref 7–16)
BUN: 10 mg/dL (ref 7–18)
Calcium, Total: 9.2 mg/dL (ref 8.5–10.1)
Chloride: 109 mmol/L — ABNORMAL HIGH (ref 98–107)
Co2: 25 mmol/L (ref 21–32)
Creatinine: 0.86 mg/dL (ref 0.60–1.30)
EGFR (African American): >60
Glucose: 113 mg/dL — ABNORMAL HIGH (ref 65–99)
Osmolality: 281 (ref 275–301)
Potassium: 3.8 mmol/L (ref 3.5–5.1)
Sodium: 141 mmol/L (ref 136–145)

## 2013-07-03 LAB — TROPONIN I
Troponin-I: <0.02 ng/mL
Troponin-I: <0.02 ng/mL
Troponin-I: <0.02 ng/mL

## 2013-07-03 LAB — CK-MB: CK-MB: <0.5 ng/mL — ABNORMAL LOW (ref 0.5–3.6)

## 2013-07-03 LAB — PRO B NATRIURETIC PEPTIDE: B-Type Natriuretic Peptide: 2508 pg/mL — ABNORMAL HIGH (ref 0–125)

## 2013-07-03 LAB — CK
CK, Total: 31 U/L
CK, Total: 31 U/L

## 2013-07-04 DIAGNOSIS — R059 Cough, unspecified: Secondary | ICD-10-CM

## 2013-07-04 DIAGNOSIS — R05 Cough: Secondary | ICD-10-CM

## 2013-07-04 LAB — CBC WITH DIFFERENTIAL/PLATELET
BASOS PCT: 0.6 %
Basophil #: 0 10*3/uL (ref 0.0–0.1)
EOS PCT: 3.6 %
Eosinophil #: 0.2 10*3/uL (ref 0.0–0.7)
HCT: 35.3 % (ref 35.0–47.0)
HGB: 11.2 g/dL — ABNORMAL LOW (ref 12.0–16.0)
LYMPHS PCT: 28.6 %
Lymphocyte #: 1.8 10*3/uL (ref 1.0–3.6)
MCH: 25.2 pg — ABNORMAL LOW (ref 26.0–34.0)
MCHC: 31.8 g/dL — AB (ref 32.0–36.0)
MCV: 79 fL — AB (ref 80–100)
Monocyte #: 0.6 x10 3/mm (ref 0.2–0.9)
Monocyte %: 9.7 %
NEUTROS ABS: 3.7 10*3/uL (ref 1.4–6.5)
Neutrophil %: 57.5 %
Platelet: 196 10*3/uL (ref 150–440)
RBC: 4.47 10*6/uL (ref 3.80–5.20)
RDW: 15.2 % — ABNORMAL HIGH (ref 11.5–14.5)
WBC: 6.4 10*3/uL (ref 3.6–11.0)

## 2013-07-04 LAB — CK: CK, Total: 26 U/L

## 2013-07-04 LAB — BASIC METABOLIC PANEL
Anion Gap: 7 (ref 7–16)
BUN: 14 mg/dL (ref 7–18)
CO2: 28 mmol/L (ref 21–32)
CREATININE: 0.95 mg/dL (ref 0.60–1.30)
Calcium, Total: 9 mg/dL (ref 8.5–10.1)
Chloride: 107 mmol/L (ref 98–107)
EGFR (Non-African Amer.): 60 — ABNORMAL LOW
Glucose: 109 mg/dL — ABNORMAL HIGH (ref 65–99)
Osmolality: 284 (ref 275–301)
Potassium: 3.6 mmol/L (ref 3.5–5.1)
Sodium: 142 mmol/L (ref 136–145)

## 2013-07-04 LAB — LIPID PANEL
Cholesterol: 150 mg/dL (ref 0–200)
HDL Cholesterol: 50 mg/dL (ref 40–60)
LDL CHOLESTEROL, CALC: 63 mg/dL (ref 0–100)
Triglycerides: 184 mg/dL (ref 0–200)
VLDL Cholesterol, Calc: 37 mg/dL (ref 5–40)

## 2013-07-04 LAB — TSH: Thyroid Stimulating Horm: 0.659 u[IU]/mL

## 2013-07-04 LAB — MAGNESIUM: Magnesium: 2.1 mg/dL

## 2013-07-05 ENCOUNTER — Inpatient Hospital Stay: Payer: Self-pay | Admitting: Internal Medicine

## 2013-07-05 DIAGNOSIS — R7989 Other specified abnormal findings of blood chemistry: Secondary | ICD-10-CM

## 2013-07-05 DIAGNOSIS — I5043 Acute on chronic combined systolic (congestive) and diastolic (congestive) heart failure: Secondary | ICD-10-CM

## 2013-07-05 DIAGNOSIS — J9620 Acute and chronic respiratory failure, unspecified whether with hypoxia or hypercapnia: Secondary | ICD-10-CM

## 2013-07-05 LAB — BASIC METABOLIC PANEL
ANION GAP: 6 — AB (ref 7–16)
BUN: 19 mg/dL — AB (ref 7–18)
CALCIUM: 9.7 mg/dL (ref 8.5–10.1)
CHLORIDE: 104 mmol/L (ref 98–107)
Co2: 25 mmol/L (ref 21–32)
Creatinine: 1.06 mg/dL (ref 0.60–1.30)
GFR CALC NON AF AMER: 52 — AB
GLUCOSE: 131 mg/dL — AB (ref 65–99)
Osmolality: 274 (ref 275–301)
POTASSIUM: 3.7 mmol/L (ref 3.5–5.1)
Sodium: 135 mmol/L — ABNORMAL LOW (ref 136–145)

## 2013-07-05 LAB — CBC
HCT: 39.5 % (ref 35.0–47.0)
HGB: 12.3 g/dL (ref 12.0–16.0)
MCH: 24.7 pg — ABNORMAL LOW (ref 26.0–34.0)
MCHC: 31 g/dL — AB (ref 32.0–36.0)
MCV: 79 fL — ABNORMAL LOW (ref 80–100)
PLATELETS: 242 10*3/uL (ref 150–440)
RBC: 4.97 10*6/uL (ref 3.80–5.20)
RDW: 15.4 % — ABNORMAL HIGH (ref 11.5–14.5)
WBC: 12.3 10*3/uL — AB (ref 3.6–11.0)

## 2013-07-05 LAB — PROTIME-INR
INR: 1
PROTHROMBIN TIME: 13.2 s (ref 11.5–14.7)

## 2013-07-05 LAB — D-DIMER(ARMC)

## 2013-07-05 LAB — TROPONIN I
TROPONIN-I: 0.75 ng/mL — AB
Troponin-I: 0.49 ng/mL — ABNORMAL HIGH
Troponin-I: 0.43 ng/mL — ABNORMAL HIGH

## 2013-07-05 LAB — CK-MB
CK-MB: 1.6 ng/mL (ref 0.5–3.6)
CK-MB: 2.4 ng/mL (ref 0.5–3.6)
CK-MB: 2.5 ng/mL (ref 0.5–3.6)

## 2013-07-05 LAB — PRO B NATRIURETIC PEPTIDE: B-TYPE NATIURETIC PEPTID: 3079 pg/mL — AB (ref 0–125)

## 2013-07-05 LAB — APTT
ACTIVATED PTT: 53.6 s — AB (ref 23.6–35.9)
Activated PTT: 27.5 secs (ref 23.6–35.9)

## 2013-07-06 ENCOUNTER — Telehealth: Payer: Self-pay

## 2013-07-06 LAB — COMPREHENSIVE METABOLIC PANEL
ANION GAP: 6 — AB (ref 7–16)
Albumin: 3.1 g/dL — ABNORMAL LOW (ref 3.4–5.0)
Alkaline Phosphatase: 100 U/L
BUN: 16 mg/dL (ref 7–18)
Bilirubin,Total: 0.2 mg/dL (ref 0.2–1.0)
CREATININE: 0.92 mg/dL (ref 0.60–1.30)
Calcium, Total: 8.3 mg/dL — ABNORMAL LOW (ref 8.5–10.1)
Chloride: 104 mmol/L (ref 98–107)
Co2: 30 mmol/L (ref 21–32)
EGFR (Non-African Amer.): >60
Glucose: 116 mg/dL — ABNORMAL HIGH (ref 65–99)
Osmolality: 282 (ref 275–301)
POTASSIUM: 3.9 mmol/L (ref 3.5–5.1)
SGOT(AST): 13 U/L — ABNORMAL LOW (ref 15–37)
SGPT (ALT): 13 U/L (ref 12–78)
SODIUM: 140 mmol/L (ref 136–145)
Total Protein: 6.4 g/dL (ref 6.4–8.2)

## 2013-07-06 LAB — CBC WITH DIFFERENTIAL/PLATELET
Basophil #: 0.1 10*3/uL (ref 0.0–0.1)
Basophil %: 0.7 %
Eosinophil #: 0.2 10*3/uL (ref 0.0–0.7)
Eosinophil %: 2.1 %
HCT: 32.7 % — ABNORMAL LOW (ref 35.0–47.0)
HGB: 10.2 g/dL — AB (ref 12.0–16.0)
LYMPHS ABS: 2.6 10*3/uL (ref 1.0–3.6)
Lymphocyte %: 32 %
MCH: 24.8 pg — ABNORMAL LOW (ref 26.0–34.0)
MCHC: 31.3 g/dL — ABNORMAL LOW (ref 32.0–36.0)
MCV: 79 fL — ABNORMAL LOW (ref 80–100)
Monocyte #: 0.7 x10 3/mm (ref 0.2–0.9)
Monocyte %: 8.5 %
NEUTROS ABS: 4.6 10*3/uL (ref 1.4–6.5)
Neutrophil %: 56.7 %
PLATELETS: 200 10*3/uL (ref 150–440)
RBC: 4.12 10*6/uL (ref 3.80–5.20)
RDW: 15.7 % — AB (ref 11.5–14.5)
WBC: 8 10*3/uL (ref 3.6–11.0)

## 2013-07-06 LAB — PROTIME-INR
INR: 1
Prothrombin Time: 13.2 secs (ref 11.5–14.7)

## 2013-07-06 LAB — APTT
ACTIVATED PTT: 73.7 s — AB (ref 23.6–35.9)
Activated PTT: 64.4 secs — ABNORMAL HIGH (ref 23.6–35.9)

## 2013-07-06 NOTE — Telephone Encounter (Signed)
L MOM TO SCHEDULE PH F/U APPT 1-2 WEEKS

## 2013-07-07 LAB — CBC WITH DIFFERENTIAL/PLATELET
BASOS ABS: 0 10*3/uL (ref 0.0–0.1)
Basophil %: 0.6 %
EOS ABS: 0.3 10*3/uL (ref 0.0–0.7)
Eosinophil %: 4.4 %
HCT: 32.8 % — ABNORMAL LOW (ref 35.0–47.0)
HGB: 10.6 g/dL — ABNORMAL LOW (ref 12.0–16.0)
LYMPHS ABS: 2.2 10*3/uL (ref 1.0–3.6)
LYMPHS PCT: 35.1 %
MCH: 25.6 pg — ABNORMAL LOW (ref 26.0–34.0)
MCHC: 32.3 g/dL (ref 32.0–36.0)
MCV: 79 fL — ABNORMAL LOW (ref 80–100)
MONOS PCT: 9.2 %
Monocyte #: 0.6 x10 3/mm (ref 0.2–0.9)
NEUTROS ABS: 3.1 10*3/uL (ref 1.4–6.5)
Neutrophil %: 50.7 %
Platelet: 190 10*3/uL (ref 150–440)
RBC: 4.13 10*6/uL (ref 3.80–5.20)
RDW: 15.8 % — ABNORMAL HIGH (ref 11.5–14.5)
WBC: 6.2 10*3/uL (ref 3.6–11.0)

## 2013-07-07 LAB — PROTIME-INR
INR: 1.1
Prothrombin Time: 14.1 secs (ref 11.5–14.7)

## 2013-07-07 LAB — APTT: Activated PTT: 83 secs — ABNORMAL HIGH (ref 23.6–35.9)

## 2013-07-08 DIAGNOSIS — R748 Abnormal levels of other serum enzymes: Secondary | ICD-10-CM

## 2013-07-08 DIAGNOSIS — I5043 Acute on chronic combined systolic (congestive) and diastolic (congestive) heart failure: Secondary | ICD-10-CM

## 2013-07-08 DIAGNOSIS — I2699 Other pulmonary embolism without acute cor pulmonale: Secondary | ICD-10-CM

## 2013-07-08 LAB — PROTIME-INR
INR: 1.3
Prothrombin Time: 15.5 secs — ABNORMAL HIGH (ref 11.5–14.7)

## 2013-07-08 LAB — CBC WITH DIFFERENTIAL/PLATELET
Basophil #: 0 10*3/uL (ref 0.0–0.1)
Basophil %: 0.5 %
Eosinophil #: 0.2 10*3/uL (ref 0.0–0.7)
Eosinophil %: 4.5 %
HCT: 32.6 % — ABNORMAL LOW (ref 35.0–47.0)
HGB: 10.5 g/dL — ABNORMAL LOW (ref 12.0–16.0)
LYMPHS ABS: 1.4 10*3/uL (ref 1.0–3.6)
LYMPHS PCT: 26.2 %
MCH: 25.6 pg — AB (ref 26.0–34.0)
MCHC: 32.3 g/dL (ref 32.0–36.0)
MCV: 79 fL — ABNORMAL LOW (ref 80–100)
Monocyte #: 0.4 x10 3/mm (ref 0.2–0.9)
Monocyte %: 8.5 %
NEUTROS PCT: 60.3 %
Neutrophil #: 3.1 10*3/uL (ref 1.4–6.5)
Platelet: 199 10*3/uL (ref 150–440)
RBC: 4.12 10*6/uL (ref 3.80–5.20)
RDW: 15.2 % — AB (ref 11.5–14.5)
WBC: 5.2 10*3/uL (ref 3.6–11.0)

## 2013-07-08 LAB — APTT
ACTIVATED PTT: 57.5 s — AB (ref 23.6–35.9)
ACTIVATED PTT: 72.8 s — AB (ref 23.6–35.9)
Activated PTT: 61.5 secs — ABNORMAL HIGH (ref 23.6–35.9)

## 2013-07-09 LAB — PROTIME-INR
INR: 2.1
Prothrombin Time: 22.7 secs — ABNORMAL HIGH (ref 11.5–14.7)

## 2013-07-09 LAB — APTT: Activated PTT: 98.2 s — ABNORMAL HIGH

## 2013-07-10 ENCOUNTER — Emergency Department: Payer: Self-pay | Admitting: Emergency Medicine

## 2013-07-10 LAB — URINALYSIS, COMPLETE
BILIRUBIN, UR: NEGATIVE
Blood: NEGATIVE
Glucose,UR: NEGATIVE mg/dL (ref 0–75)
Ketone: NEGATIVE
Nitrite: NEGATIVE
PH: 7 (ref 4.5–8.0)
Protein: NEGATIVE
Specific Gravity: 1.008 (ref 1.003–1.030)
Squamous Epithelial: <1
WBC UR: 21 /HPF (ref 0–5)

## 2013-07-10 LAB — CBC WITH DIFFERENTIAL/PLATELET
BASOS ABS: 0.1 10*3/uL (ref 0.0–0.1)
BASOS PCT: 1 %
Basophil #: 0 10*3/uL (ref 0.0–0.1)
Basophil %: 0.5 %
EOS ABS: 0.2 10*3/uL (ref 0.0–0.7)
EOS ABS: 0.2 10*3/uL (ref 0.0–0.7)
EOS PCT: 4.2 %
EOS PCT: 2.9 %
HCT: 33.4 % — ABNORMAL LOW (ref 35.0–47.0)
HCT: 35.3 % (ref 35.0–47.0)
HGB: 10.5 g/dL — ABNORMAL LOW (ref 12.0–16.0)
HGB: 11 g/dL — ABNORMAL LOW (ref 12.0–16.0)
LYMPHS ABS: 1.4 10*3/uL (ref 1.0–3.6)
LYMPHS PCT: 25.8 %
Lymphocyte #: 1.4 10*3/uL (ref 1.0–3.6)
Lymphocyte %: 22.1 %
MCH: 24.9 pg — ABNORMAL LOW (ref 26.0–34.0)
MCH: 24.6 pg — AB (ref 26.0–34.0)
MCHC: 31.5 g/dL — ABNORMAL LOW (ref 32.0–36.0)
MCHC: 31.2 g/dL — AB (ref 32.0–36.0)
MCV: 79 fL — ABNORMAL LOW (ref 80–100)
MCV: 79 fL — ABNORMAL LOW (ref 80–100)
MONO ABS: 0.6 x10 3/mm (ref 0.2–0.9)
Monocyte #: 0.6 x10 3/mm (ref 0.2–0.9)
Monocyte %: 11 %
Monocyte %: 10.2 %
NEUTROS PCT: 63.8 %
Neutrophil #: 3.3 10*3/uL (ref 1.4–6.5)
Neutrophil #: 3.9 10*3/uL (ref 1.4–6.5)
Neutrophil %: 58.5 %
Platelet: 242 10*3/uL (ref 150–440)
Platelet: 243 10*3/uL (ref 150–440)
RBC: 4.23 10*6/uL (ref 3.80–5.20)
RBC: 4.48 10*6/uL (ref 3.80–5.20)
RDW: 15.6 % — ABNORMAL HIGH (ref 11.5–14.5)
RDW: 15.6 % — ABNORMAL HIGH (ref 11.5–14.5)
WBC: 5.6 10*3/uL (ref 3.6–11.0)
WBC: 6.1 10*3/uL (ref 3.6–11.0)

## 2013-07-10 LAB — COMPREHENSIVE METABOLIC PANEL
ALBUMIN: 3.8 g/dL (ref 3.4–5.0)
ANION GAP: 8 (ref 7–16)
Alkaline Phosphatase: 123 U/L — ABNORMAL HIGH
BUN: 7 mg/dL (ref 7–18)
Bilirubin,Total: 0.3 mg/dL (ref 0.2–1.0)
Calcium, Total: 9.2 mg/dL (ref 8.5–10.1)
Chloride: 106 mmol/L (ref 98–107)
Co2: 25 mmol/L (ref 21–32)
Creatinine: 1.03 mg/dL (ref 0.60–1.30)
EGFR (African American): >60
GFR CALC NON AF AMER: 54 — AB
GLUCOSE: 114 mg/dL — AB (ref 65–99)
Osmolality: 276 (ref 275–301)
Potassium: 3.8 mmol/L (ref 3.5–5.1)
SGOT(AST): 32 U/L (ref 15–37)
SGPT (ALT): 29 U/L (ref 12–78)
SODIUM: 139 mmol/L (ref 136–145)
TOTAL PROTEIN: 8.4 g/dL — AB (ref 6.4–8.2)

## 2013-07-10 LAB — BASIC METABOLIC PANEL
ANION GAP: 6 — AB (ref 7–16)
BUN: 7 mg/dL (ref 7–18)
CO2: 29 mmol/L (ref 21–32)
Calcium, Total: 9 mg/dL (ref 8.5–10.1)
Chloride: 107 mmol/L (ref 98–107)
Creatinine: 0.87 mg/dL (ref 0.60–1.30)
EGFR (African American): >60
Glucose: 113 mg/dL — ABNORMAL HIGH (ref 65–99)
Osmolality: 282 (ref 275–301)
Potassium: 3.8 mmol/L (ref 3.5–5.1)
Sodium: 142 mmol/L (ref 136–145)

## 2013-07-10 LAB — PROTIME-INR
INR: 2.9
INR: 2.8
PROTHROMBIN TIME: 28.6 s — AB (ref 11.5–14.7)
Prothrombin Time: 29.4 secs — ABNORMAL HIGH (ref 11.5–14.7)

## 2013-07-10 LAB — APTT
ACTIVATED PTT: 146.3 s — AB (ref 23.6–35.9)
Activated PTT: 34.6 secs (ref 23.6–35.9)

## 2013-07-10 LAB — LIPASE, BLOOD: LIPASE: 81 U/L (ref 73–393)

## 2013-07-29 ENCOUNTER — Ambulatory Visit (HOSPITAL_COMMUNITY): Payer: Medicare Other | Admitting: Psychiatry

## 2013-08-17 ENCOUNTER — Encounter (HOSPITAL_COMMUNITY): Admission: RE | Payer: Self-pay | Source: Ambulatory Visit

## 2013-08-17 ENCOUNTER — Inpatient Hospital Stay (HOSPITAL_COMMUNITY): Admission: RE | Admit: 2013-08-17 | Payer: Medicare Other | Source: Ambulatory Visit | Admitting: Orthopedic Surgery

## 2013-08-17 SURGERY — ARTHROPLASTY, KNEE, TOTAL
Anesthesia: Choice | Site: Knee | Laterality: Right

## 2013-08-25 ENCOUNTER — Telehealth (HOSPITAL_COMMUNITY): Payer: Self-pay | Admitting: Interventional Radiology

## 2013-08-25 NOTE — Telephone Encounter (Signed)
Spoke to pt's husband and he said they have decided not to continue to follow-up with Dr. Estanislado Pandy at this time. I told them to please call us back if they needed to in the future. He said that he would do so. JMIchaux

## 2013-09-08 ENCOUNTER — Other Ambulatory Visit: Payer: Medicare Other

## 2013-09-14 ENCOUNTER — Ambulatory Visit: Payer: Medicare Other | Admitting: Cardiovascular Disease

## 2013-09-23 ENCOUNTER — Encounter: Payer: Self-pay | Admitting: Internal Medicine

## 2013-10-13 ENCOUNTER — Ambulatory Visit (HOSPITAL_COMMUNITY): Payer: Medicare Other

## 2013-10-20 ENCOUNTER — Encounter: Payer: Self-pay | Admitting: Internal Medicine

## 2013-11-19 ENCOUNTER — Encounter: Payer: Self-pay | Admitting: Internal Medicine

## 2014-01-28 ENCOUNTER — Encounter (HOSPITAL_COMMUNITY): Payer: Self-pay | Admitting: Cardiovascular Disease

## 2014-02-24 ENCOUNTER — Other Ambulatory Visit: Payer: Self-pay | Admitting: Orthopedic Surgery

## 2014-02-24 DIAGNOSIS — M25562 Pain in left knee: Secondary | ICD-10-CM

## 2014-03-02 ENCOUNTER — Ambulatory Visit
Admission: RE | Admit: 2014-03-02 | Discharge: 2014-03-02 | Disposition: A | Discharge status: Home / Self Care | Payer: Medicare Other | Source: Ambulatory Visit | Attending: Orthopedic Surgery | Admitting: Orthopedic Surgery

## 2014-03-02 DIAGNOSIS — M25562 Pain in left knee: Secondary | ICD-10-CM

## 2014-05-11 ENCOUNTER — Ambulatory Visit: Payer: Self-pay | Admitting: Orthopedic Surgery

## 2014-05-11 NOTE — Progress Notes (Signed)
Preoperative surgical orders have been place into the Epic hospital system for [name] on 05/11/2014, 12:01 PM  by Mickel Crow for surgery on 4-11.  Preop [facility] orders including Experal, IV Tylenol, and IV Decadron as long as there are no contraindications to the above medications. Arlee Muslim, PA-C

## 2014-05-21 ENCOUNTER — Encounter (HOSPITAL_COMMUNITY): Payer: Self-pay

## 2014-05-21 ENCOUNTER — Inpatient Hospital Stay (HOSPITAL_COMMUNITY): Admission: RE | Admit: 2014-05-21 | Payer: Medicare Other | Source: Ambulatory Visit

## 2014-05-21 ENCOUNTER — Encounter (HOSPITAL_COMMUNITY)
Admission: RE | Admit: 2014-05-21 | Discharge: 2014-05-21 | Disposition: A | Discharge status: Home / Self Care | Payer: Medicare Other | Source: Ambulatory Visit | Attending: Orthopedic Surgery | Admitting: Orthopedic Surgery

## 2014-05-21 DIAGNOSIS — Z01812 Encounter for preprocedural laboratory examination: Secondary | ICD-10-CM | POA: Insufficient documentation

## 2014-05-21 DIAGNOSIS — Z01810 Encounter for preprocedural cardiovascular examination: Secondary | ICD-10-CM | POA: Diagnosis not present

## 2014-05-21 HISTORY — DX: Anxiety disorder, unspecified: F41.9

## 2014-05-21 HISTORY — DX: Other pulmonary embolism without acute cor pulmonale: I26.99

## 2014-05-21 HISTORY — DX: Major depressive disorder, single episode, unspecified: F32.9

## 2014-05-21 HISTORY — DX: Acute embolism and thrombosis of unspecified popliteal vein: I82.439

## 2014-05-21 HISTORY — DX: Depression, unspecified: F32.A

## 2014-05-21 LAB — CBC
HEMATOCRIT: 40.4 % (ref 36.0–46.0)
HEMOGLOBIN: 12.3 g/dL (ref 12.0–15.0)
MCH: 25.3 pg — ABNORMAL LOW (ref 26.0–34.0)
MCHC: 30.4 g/dL (ref 30.0–36.0)
MCV: 83.1 fL (ref 78.0–100.0)
Platelets: 249 10*3/uL (ref 150–400)
RBC: 4.86 MIL/uL (ref 3.87–5.11)
RDW: 15.2 % (ref 11.5–15.5)
WBC: 4.4 10*3/uL (ref 4.0–10.5)

## 2014-05-21 LAB — COMPREHENSIVE METABOLIC PANEL
ALT: 16 U/L (ref 0–35)
AST: 21 U/L (ref 0–37)
Albumin: 4.4 g/dL (ref 3.5–5.2)
Alkaline Phosphatase: 114 U/L (ref 39–117)
Anion gap: 9 (ref 5–15)
BILIRUBIN TOTAL: 0.4 mg/dL (ref 0.3–1.2)
BUN: 27 mg/dL — ABNORMAL HIGH (ref 6–23)
CO2: 26 mmol/L (ref 19–32)
CREATININE: 0.85 mg/dL (ref 0.50–1.10)
Calcium: 9.7 mg/dL (ref 8.4–10.5)
Chloride: 105 mmol/L (ref 96–112)
GFR calc Af Amer: 77 mL/min — ABNORMAL LOW (ref >90)
GFR, EST NON AFRICAN AMERICAN: 66 mL/min — AB (ref >90)
Glucose, Bld: 92 mg/dL (ref 70–99)
Potassium: 5.1 mmol/L (ref 3.5–5.1)
SODIUM: 140 mmol/L (ref 135–145)
TOTAL PROTEIN: 7.5 g/dL (ref 6.0–8.3)

## 2014-05-21 LAB — PROTIME-INR
INR: 3.09 — AB (ref 0.00–1.49)
PROTHROMBIN TIME: 32.1 s — AB (ref 11.6–15.2)

## 2014-05-21 LAB — URINALYSIS, ROUTINE W REFLEX MICROSCOPIC
Bilirubin Urine: NEGATIVE
Glucose, UA: NEGATIVE mg/dL
Hgb urine dipstick: NEGATIVE
KETONES UR: NEGATIVE mg/dL
Leukocytes, UA: NEGATIVE
Nitrite: NEGATIVE
PH: 5 (ref 5.0–8.0)
PROTEIN: NEGATIVE mg/dL
Specific Gravity, Urine: 1.019 (ref 1.005–1.030)
Urobilinogen, UA: 0.2 mg/dL (ref 0.0–1.0)

## 2014-05-21 LAB — APTT: aPTT: 53 seconds — ABNORMAL HIGH (ref 24–37)

## 2014-05-21 LAB — SURGICAL PCR SCREEN
MRSA, PCR: NEGATIVE
Staphylococcus aureus: NEGATIVE

## 2014-05-21 NOTE — Patient Instructions (Signed)
[place]  05/21/2014   Your procedure is scheduled on:   05-31-2014 Monday  Enter through [facility]  Entrance and follow signs to [facility]. Arrive at    0730    AM.  Call this number if you have problems the morning of surgery: [phone]  Or Presurgical Testing [phone].   For Living Will and/or Health Care Power Attorney Forms: please provide copy for your medical record,may bring AM of surgery(Forms should be already notarized -we do not provide this service).(05-21-14 Yes, will provide information on AM of surgery).    Do not eat food/ or drink: After Midnight.   Take these medicines the morning of surgery with A SIP OF WATER: Metoprolol. Cetirizine. Escitalopram. Levothyroxine.Tylenol. Ranitidine. Use/bring Nasonex(if need), and Eye drops. Coumadin and Aspirin use per MD instructions.   Do not wear jewelry, make-up or nail polish.  Do not wear deodorant, lotions, powders, or perfumes.   Do not shave legs and under arms- 48 hours(2 days) prior to first CHG shower.(Shaving face and neck okay.)  Do not bring valuables to the hospital.(Hospital is not responsible for lost valuables).  Contacts, dentures or removable bridgework, body piercing, hair pins may not be worn into surgery.  Leave suitcase in the car. After surgery it may be brought to your room.  For patients admitted to the hospital, checkout time is 11:00 AM the day of discharge.(Restricted visitors-Any Persons displaying flu-like symptoms or illness).    Patients discharged the day of surgery will not be allowed to drive home. Must have responsible person with you x 24 hours once discharged.  Name and phone number of your driver:[place]- spouse [phone] cell      Please read over the following fact sheets that you were given:  CHG(Chlorhexidine Gluconate 4% Surgical Soap) use, MRSA Information, Blood Transfusion fact sheet, Incentive Spirometry Instruction.  Remember : Type/Screen "Blue  armbands" - may not be removed once applied(would result in being retested AM of surgery, if removed).         [facility] - Preparing for Surgery Before surgery, you can play an important role.  Because skin is not sterile, your skin needs to be as free of germs as possible.  You can reduce the number of germs on your skin by washing with CHG (chlorahexidine gluconate) soap before surgery.  CHG is an antiseptic cleaner which kills germs and bonds with the skin to continue killing germs even after washing. Please DO NOT use if you have an allergy to CHG or antibacterial soaps.  If your skin becomes reddened/irritated stop using the CHG and inform your nurse when you arrive at Short Stay. Do not shave (including legs and underarms) for at least 48 hours prior to the first CHG shower.  You may shave your face/neck. Please follow these instructions carefully:  1.  Shower with CHG Soap the night before surgery and the  morning of Surgery.  2.  If you choose to wash your hair, wash your hair first as usual with your  normal  shampoo.  3.  After you shampoo, rinse your hair and body thoroughly to remove the  shampoo.                           4.  Use CHG as you would any other liquid soap.  You can apply chg directly  to the skin and wash  Gently with a scrungie or clean washcloth.  5.  Apply the CHG Soap to your body ONLY FROM THE NECK DOWN.   Do not use on face/ open                           Wound or open sores. Avoid contact with eyes, ears mouth and genitals (private parts).                       Wash face,  Genitals (private parts) with your normal soap.             6.  Wash thoroughly, paying special attention to the area where your surgery  will be performed.  7.  Thoroughly rinse your body with warm water from the neck down.  8.  DO NOT shower/wash with your normal soap after using and rinsing off  the CHG Soap.                9.  Pat yourself dry with a clean towel.             10.  Wear clean pajamas.            11.  Place clean sheets on your bed the night of your first shower and do not  sleep with pets. Day of Surgery : Do not apply any lotions/deodorants the morning of surgery.  Please wear clean clothes to the hospital/surgery center.  FAILURE TO FOLLOW THESE INSTRUCTIONS MAY RESULT IN THE CANCELLATION OF YOUR SURGERY PATIENT SIGNATURE_________________________________  NURSE SIGNATURE__________________________________  ________________________________________________________________________   Adam Phenix  An incentive spirometer is a tool that can help keep your lungs clear and active. This tool measures how well you are filling your lungs with each breath. Taking long deep breaths may help reverse or decrease the chance of developing breathing (pulmonary) problems (especially infection) following:  A long period of time when you are unable to move or be active. BEFORE THE PROCEDURE   If the spirometer includes an indicator to show your best effort, your nurse or respiratory therapist will set it to a desired goal.  If possible, sit up straight or lean slightly forward. Try not to slouch.  Hold the incentive spirometer in an upright position. INSTRUCTIONS FOR USE   Sit on the edge of your bed if possible, or sit up as far as you can in bed or on a chair.  Hold the incentive spirometer in an upright position.  Breathe out normally.  Place the mouthpiece in your mouth and seal your lips tightly around it.  Breathe in slowly and as deeply as possible, raising the piston or the ball toward the top of the column.  Hold your breath for 3-5 seconds or for as long as possible. Allow the piston or ball to fall to the bottom of the column.  Remove the mouthpiece from your mouth and breathe out normally.  Rest for a few seconds and repeat Steps 1 through 7 at least 10 times every 1-2 hours when you are awake. Take your time and take a few  normal breaths between deep breaths.  The spirometer may include an indicator to show your best effort. Use the indicator as a goal to work toward during each repetition.  After each set of 10 deep breaths, practice coughing to be sure your lungs are clear. If you have an incision (the cut made at the time of  surgery), support your incision when coughing by placing a pillow or rolled up towels firmly against it. Once you are able to get out of bed, walk around indoors and cough well. You may stop using the incentive spirometer when instructed by your caregiver.  RISKS AND COMPLICATIONS  Take your time so you do not get dizzy or light-headed.  If you are in pain, you may need to take or ask for pain medication before doing incentive spirometry. It is harder to take a deep breath if you are having pain. AFTER USE  Rest and breathe slowly and easily.  It can be helpful to keep track of a log of your progress. Your caregiver can provide you with a simple table to help with this. If you are using the spirometer at home, follow these instructions: [place] IF:   You are having difficultly using the spirometer.  You have trouble using the spirometer as often as instructed.  Your pain medication is not giving enough relief while using the spirometer.  You develop fever of 100.5 F (38.1 C) or higher. SEEK IMMEDIATE MEDICAL CARE IF:   You cough up bloody sputum that had not been present before.  You develop fever of 102 F (38.9 C) or greater.  You develop worsening pain at or near the incision site. MAKE SURE YOU:   Understand these instructions.  Will watch your condition.  Will get help right away if you are not doing well or get worse. Document Released: 06/18/2006 Document Revised: 04/30/2011 Document Reviewed: 08/19/2006 ExitCare Patient Information 2014 Exit[employer], [place].   ________________________________________________________________________  WHAT IS A BLOOD  TRANSFUSION? Blood Transfusion Information  A transfusion is the replacement of blood or some of its parts. Blood is made up of multiple cells which provide different functions.  Red blood cells carry oxygen and are used for blood loss replacement.  White blood cells fight against infection.  Platelets control bleeding.  Plasma helps clot blood.  Other blood products are available for specialized needs, such as hemophilia or other clotting disorders. BEFORE THE TRANSFUSION  Who gives blood for transfusions?   Healthy volunteers who are fully evaluated to make sure their blood is safe. This is blood bank blood. Transfusion therapy is the safest it has ever been in the practice of medicine. Before blood is taken from a donor, a complete history is taken to make sure that person has no history of diseases nor engages in risky social behavior (examples are intravenous drug use or sexual activity with multiple partners). The donor's travel history is screened to minimize risk of transmitting infections, such as malaria. The donated blood is tested for signs of infectious diseases, such as HIV and hepatitis. The blood is then tested to be sure it is compatible with you in order to minimize the chance of a transfusion reaction. If you or a relative donates blood, this is often done in anticipation of surgery and is not appropriate for emergency situations. It takes many days to process the donated blood. RISKS AND COMPLICATIONS Although transfusion therapy is very safe and saves many lives, the main dangers of transfusion include:   Getting an infectious disease.  Developing a transfusion reaction. This is an allergic reaction to something in the blood you were given. Every precaution is taken to prevent this. The decision to have a blood transfusion has been considered carefully by your caregiver before blood is given. Blood is not given unless the benefits outweigh the risks. AFTER THE  TRANSFUSION  Right after receiving a blood transfusion, you will usually feel much better and more energetic. This is especially true if your red blood cells have gotten low (anemic). The transfusion raises the level of the red blood cells which carry oxygen, and this usually causes an energy increase.  The nurse administering the transfusion will monitor you carefully for complications. HOME CARE INSTRUCTIONS  No special instructions are needed after a transfusion. You may find your energy is better. Speak with your caregiver about any limitations on activity for underlying diseases you may have. SEEK MEDICAL CARE IF:   Your condition is not improving after your transfusion.  You develop redness or irritation at the intravenous (IV) site. SEEK IMMEDIATE MEDICAL CARE IF:  Any of the following symptoms occur over the next 12 hours:  Shaking chills.  You have a temperature by mouth above 102 F (38.9 C), not controlled by medicine.  Chest, back, or muscle pain.  People around you feel you are not acting correctly or are confused.  Shortness of breath or difficulty breathing.  Dizziness and fainting.  You get a rash or develop hives.  You have a decrease in urine output.  Your urine turns a dark color or changes to pink, red, or brown. Any of the following symptoms occur over the next 10 days:  You have a temperature by mouth above 102 F (38.9 C), not controlled by medicine.  Shortness of breath.  Weakness after normal activity.  The white part of the eye turns yellow (jaundice).  You have a decrease in the amount of urine or are urinating less often.  Your urine turns a dark color or changes to pink, red, or brown. Document Released: 02/03/2000 Document Revised: 04/30/2011 Document Reviewed: 09/22/2007 [facility] Patient Information 2014 [place], [place].  _______________________________________________________________________

## 2014-05-21 NOTE — Pre-Procedure Instructions (Signed)
05-21-14 EKG 12'15 Report in Care Everywhere([facility])-no hard copy, but has been requested. Pt. Had chest pain at that time and was evaluated at [facility] with multiple test. "was thought to be anxiety". Clearance note (Dr. Alycia Rossetti) [place], chart  Care everywhere. CXR  01-19-14 Care Everywhere [facility].

## 2014-05-21 NOTE — Pre-Procedure Instructions (Signed)
05-21-14 EKG done today pt had episode of recent chest pain, unable to get hard copy EKG 01-19-14(Dr. Marcell Barlow reviewed EKG of 05-21-14), may proceed as planned.

## 2014-05-21 NOTE — Progress Notes (Signed)
05-21-14 labs viewable in Epic, note PT/PTT- pt. On Coumadin, to be bridge with Lovenox. Will repeat AM of PT/INR. Note faxed to Dr. Wynelle Link [phone].

## 2014-05-29 ENCOUNTER — Ambulatory Visit: Payer: Self-pay | Admitting: Orthopedic Surgery

## 2014-05-29 NOTE — H&P (Signed)
[name] DOB: [birth year] Married / Language: English / Race: White Female Date of Admission: 05/31/2014 CC:  Left Knee Pain History of Present Illness The patient is a 74 year old female who comes in for a preoperative History and Physical. The patient is scheduled for a left uni knee replacement to be performed by Dr. Dione Plover. Aluisio, MD at [facility] on 05/31/2014. The patient is a 74 year old female who presents for follow up of their knee. The patient is being followed for their bilateral knee pain and osteoarthritis. Symptoms reported today include: pain (left worse than right). The patient feels that they are doing poorly and report their pain level to be moderate to severe. Current treatment includes: Cosamine ASU, Tylenol, and Voltaren Gel. The following medication has been used for pain control: Tylenol (prn). The patient presents today following 10 weeks post Euflexxa series. Euflexxa series did not help any w/ the knees. In fact she feels that she is doing worse than before. She is having more pain in the left knee than the right knee now. She is having a lot of stiffness in the left knee. She is not having pain at rest but is severely limited in her activity. She had a MI in April of last year and a PE in May of last year. She reports that she is currently doing well in regards to her health. She originally had the right knee scheduled to be replaced in May of 2015 but feels she needs the left knee replaced at this time. She has had cortisone injections and also Visco supplements. Nothing has really helped long-term. At this point, the most predictable means of pain and functional improvement would be a unicompartmental replacement. She has received her clearance and ready to proceed. They have been treated conservatively in the past for the above stated problem and despite conservative measures, they continue to have progressive pain and severe functional limitations and  dysfunction. They have failed non-operative management including home exercise, medications, and injections. It is felt that they would benefit from undergoing uni joint replacement. Risks and benefits of the procedure have been discussed with the patient and they elect to proceed with surgery. There are no active contraindications to surgery such as ongoing infection or rapidly progressive neurological disease.  Problem List/Past Medical  Right knee pain (M25.561) Primary osteoarthritis of first carpometacarpal joint of right hand (M18.11) S/P arthroscopy of knee (V45.89) Acute medial meniscal tear (R74.081K) Internal derangement of knee joint (717.9) Primary osteoarthritis of left knee (M17.12) Breast Cancer Avoid Left Arm, Right Arm is better Hiatal Hernia Gastroesophageal Reflux Disease Anemia Osteoarthritis Osteoporosis Vertigo Anxiety Disorder Tinnitus Asthma mild, slight Pneumonia Once Coronary Artery Disease/Heart Disease Myocardial infarction 3/07, 3/13, 4/15 Hypercholesterolemia Diverticulitis Of Colon Once Urinary Tract Infection Hypothyroidism Deep vein thrombosis Pulmonary Embolism Menopause Breast disease  Allergies Codeine/Codeine Derivatives Morphine Derivatives  Family History Heart disease in female family member before age 80 Bleeding disorder brother Diabetes Mellitus brother Cerebrovascular Accident mother Congestive Heart Failure First Degree Relatives. mother Hypertension father and brother Cancer brother Osteoarthritis First Degree Relatives. mother and brother Heart Disease mother, father and brother  Social History Marital status married Illicit drug use no Thyroidectomy; Subtotal Tobacco / smoke exposure no Number of flights of stairs before winded 2-3 Living situation live with spouse Sinus Surgery Alcohol use never consumed alcohol Copy of Drug/Alcohol Rehab (Previously)  no Tonsillectomy Straighten Nasal Septum Mastectomy bilateral Tobacco use Never smoker. never smoker Cataract Surgery bilateral  Pain Contract no Breast Biopsy bilateral, multiple times Breast Mass; Local Excision bilateral Current work status retired Games developer weekly; does other and gym / [employer] Drug/Alcohol Rehab (Currently) no Children 0 Advance Directives Living Will, Healthcare POA  Medication History Metoprolol Tartrate (25MG  Tablet, Oral) Active. Coumadin (Oral) Specific dose unknown - Active. Aspirin EC (81MG  Tablet DR, Oral) Active. Flaxseed Oil (Oral) Specific dose unknown - Active. Levothyroxine Sodium (75MCG Tablet, Oral) Active. Ranitidine HCl (150MG  Tablet, Oral) Active. Vitamin D3 (Oral) Specific dose unknown - Active. Cosamin ASU Advanced Formula (Oral) Active. Nitrostat (0.4MG  Tab Sublingual, Sublingual) Active. (prn) Caltrate 600 + D (600-200MG -IU Tablet, Oral) Active. Docusate Sodium (100MG  Capsule, Oral) Active. Magnesium Hydroxide (Oral) Specific dose unknown - Active. Pravastatin Sodium (20MG  Tablet, Oral) Active. Vitamin B-12 (1000MCG Tablet, 1 Oral) Active.  Past Surgical History Breast cancer (174.9) 1999 left/2008 right  Review of Systems General Not Present- Chills, Fatigue, Fever, Memory Loss, Night Sweats, Weight Gain and Weight Loss. Skin Not Present- Eczema, Hives, Itching, Lesions and Rash. HEENT Present- Tinnitus. Not Present- Dentures, Double Vision, Headache, Hearing Loss and Visual Loss. Respiratory Present- Shortness of breath with exertion. Not Present- Allergies, Chronic Cough, Coughing up blood and Shortness of breath at rest. Cardiovascular Present- Palpitations. Not Present- Chest Pain, Difficulty Breathing Lying Down, Murmur, Racing/skipping heartbeats and Swelling. Gastrointestinal Present- Heartburn. Not Present- Abdominal Pain, Bloody Stool, Constipation, Diarrhea, Difficulty Swallowing,  Jaundice, Loss of appetitie, Nausea and Vomiting. Female Genitourinary Present- Urinating at Night. Not Present- Blood in Urine, Discharge, Flank Pain, Incontinence, Painful Urination, Urgency, Urinary frequency, Urinary Retention and Weak urinary stream. Musculoskeletal Present- Joint Pain, Joint Swelling and Morning Stiffness. Not Present- Back Pain, Muscle Pain, Muscle Weakness and Spasms. Neurological Not Present- Blackout spells, Difficulty with balance, Dizziness, Paralysis, Tremor and Weakness. Psychiatric Not Present- Insomnia.  Vitals Weight: 151.5 lb Height: 65in Body Surface Area: 1.76 m Body Mass Index: 25.21 kg/m  BP: 114/58 (Sitting, Right Arm, Standard)  Physical Exam General Mental Status -Alert, cooperative and good historian. General Appearance-pleasant, Not in acute distress. Orientation-Oriented X3. Build & Nutrition-Well nourished and Well developed.  Head and Neck Head-normocephalic, atraumatic . Neck Global Assessment - supple, no bruit auscultated on the right, no bruit auscultated on the left.  Eye Pupil - Bilateral-Regular and Round. Motion - Bilateral-EOMI.  Chest and Lung Exam Auscultation Breath sounds - clear at anterior chest wall and clear at posterior chest wall. Adventitious sounds - No Adventitious sounds.  Cardiovascular Auscultation Rhythm - Regular rate and rhythm. Heart Sounds - S1 WNL and S2 WNL. Murmurs & Other Heart Sounds - Auscultation of the heart reveals - No Murmurs.  Abdomen Palpation/Percussion Tenderness - Abdomen is non-tender to palpation. Rigidity (guarding) - Abdomen is soft. Auscultation Auscultation of the abdomen reveals - Bowel sounds normal.  Female Genitourinary Note: Not done, not pertinent to present illness  Musculoskeletal Note: Examination reveals a well-developed female in no distress. Her left knee shows no effusion. Range is about 5 to 125, tender along the medial joint line,  but there is no lateral joint line tenderness or instability. The right knee has no effusion. Range is zero to 125, slightly tender medial greater than lateral, no instability noted.  RADIOGRAPHS: Review of her x-rays AP of both knees and lateral show that she has significant medial compartment arthritis on the left knee. She does not have any significant patellofemoral or lateral compartment disease. The right knee shows medial arthritis also.  Assessment & Plan Primary osteoarthritis of left knee (M17.12) Note:Surgical  Plans: Left Uni Knee Repalcement  Disposition: Home  PCP: Dr. Emily Filbert - Patient has been seen preoperatively and felt to be stable for surgery. Cardiology: Dr. Alycia Rossetti - Patient has been seen preoperatively and felt to be stable for surgery.  Topical TXA - Heart Disease, Breast Cancer, DVT, PE  Anesthesia Issues: Slow to wake up "years ago"  Signed electronically by Joelene Millin, III PA-C

## 2014-05-31 ENCOUNTER — Inpatient Hospital Stay (HOSPITAL_COMMUNITY)
Admission: RE | Admit: 2014-05-31 | Discharge: 2014-06-01 | DRG: 470 | Disposition: A | Discharge status: Home Health | Payer: Medicare Other | Source: Ambulatory Visit | Attending: Orthopedic Surgery | Admitting: Orthopedic Surgery

## 2014-05-31 ENCOUNTER — Encounter (HOSPITAL_COMMUNITY): Payer: Self-pay | Admitting: *Deleted

## 2014-05-31 ENCOUNTER — Inpatient Hospital Stay (HOSPITAL_COMMUNITY): Payer: Medicare Other | Admitting: Anesthesiology

## 2014-05-31 ENCOUNTER — Encounter (HOSPITAL_COMMUNITY)
Admission: RE | Disposition: A | Discharge status: Home Health | Payer: Self-pay | Source: Ambulatory Visit | Attending: Orthopedic Surgery

## 2014-05-31 DIAGNOSIS — J45909 Unspecified asthma, uncomplicated: Secondary | ICD-10-CM | POA: Diagnosis present

## 2014-05-31 DIAGNOSIS — K219 Gastro-esophageal reflux disease without esophagitis: Secondary | ICD-10-CM | POA: Diagnosis present

## 2014-05-31 DIAGNOSIS — I10 Essential (primary) hypertension: Secondary | ICD-10-CM | POA: Diagnosis present

## 2014-05-31 DIAGNOSIS — E780 Pure hypercholesterolemia: Secondary | ICD-10-CM | POA: Diagnosis present

## 2014-05-31 DIAGNOSIS — D649 Anemia, unspecified: Secondary | ICD-10-CM | POA: Diagnosis present

## 2014-05-31 DIAGNOSIS — Z86718 Personal history of other venous thrombosis and embolism: Secondary | ICD-10-CM | POA: Diagnosis not present

## 2014-05-31 DIAGNOSIS — Z885 Allergy status to narcotic agent status: Secondary | ICD-10-CM

## 2014-05-31 DIAGNOSIS — I252 Old myocardial infarction: Secondary | ICD-10-CM | POA: Diagnosis not present

## 2014-05-31 DIAGNOSIS — Z9013 Acquired absence of bilateral breasts and nipples: Secondary | ICD-10-CM | POA: Diagnosis present

## 2014-05-31 DIAGNOSIS — E785 Hyperlipidemia, unspecified: Secondary | ICD-10-CM | POA: Diagnosis present

## 2014-05-31 DIAGNOSIS — Z79899 Other long term (current) drug therapy: Secondary | ICD-10-CM | POA: Diagnosis not present

## 2014-05-31 DIAGNOSIS — Z86711 Personal history of pulmonary embolism: Secondary | ICD-10-CM | POA: Diagnosis not present

## 2014-05-31 DIAGNOSIS — M1712 Unilateral primary osteoarthritis, left knee: Principal | ICD-10-CM | POA: Diagnosis present

## 2014-05-31 DIAGNOSIS — Z853 Personal history of malignant neoplasm of breast: Secondary | ICD-10-CM | POA: Diagnosis not present

## 2014-05-31 DIAGNOSIS — F419 Anxiety disorder, unspecified: Secondary | ICD-10-CM | POA: Diagnosis present

## 2014-05-31 DIAGNOSIS — I2510 Atherosclerotic heart disease of native coronary artery without angina pectoris: Secondary | ICD-10-CM | POA: Diagnosis present

## 2014-05-31 DIAGNOSIS — H9319 Tinnitus, unspecified ear: Secondary | ICD-10-CM | POA: Diagnosis present

## 2014-05-31 DIAGNOSIS — Z7901 Long term (current) use of anticoagulants: Secondary | ICD-10-CM | POA: Diagnosis not present

## 2014-05-31 DIAGNOSIS — E039 Hypothyroidism, unspecified: Secondary | ICD-10-CM | POA: Diagnosis present

## 2014-05-31 DIAGNOSIS — Z7982 Long term (current) use of aspirin: Secondary | ICD-10-CM

## 2014-05-31 DIAGNOSIS — F329 Major depressive disorder, single episode, unspecified: Secondary | ICD-10-CM | POA: Diagnosis present

## 2014-05-31 DIAGNOSIS — M810 Age-related osteoporosis without current pathological fracture: Secondary | ICD-10-CM | POA: Diagnosis present

## 2014-05-31 DIAGNOSIS — M179 Osteoarthritis of knee, unspecified: Secondary | ICD-10-CM | POA: Diagnosis present

## 2014-05-31 DIAGNOSIS — M1710 Unilateral primary osteoarthritis, unspecified knee: Secondary | ICD-10-CM | POA: Diagnosis present

## 2014-05-31 HISTORY — PX: PARTIAL KNEE ARTHROPLASTY: SHX2174

## 2014-05-31 LAB — ABO/RH: ABO/RH(D): B POS

## 2014-05-31 LAB — PROTIME-INR
INR: 1.07 (ref 0.00–1.49)
Prothrombin Time: 14 seconds (ref 11.6–15.2)

## 2014-05-31 LAB — TYPE AND SCREEN
ABO/RH(D): B POS
Antibody Screen: NEGATIVE

## 2014-05-31 SURGERY — ARTHROPLASTY, KNEE, UNICOMPARTMENTAL
Anesthesia: Spinal | Site: Knee | Laterality: Left

## 2014-05-31 MED ORDER — TRANEXAMIC ACID 100 MG/ML IV SOLN
2000.0000 mg | INTRAVENOUS | Status: DC | PRN
  Administered 2014-05-31: 2000 mg via TOPICAL

## 2014-05-31 MED ORDER — MECLIZINE HCL 25 MG PO TABS
25.0000 mg | ORAL_TABLET | Freq: Two times a day (BID) | ORAL | Status: DC | PRN
  Filled 2014-05-31: qty 1

## 2014-05-31 MED ORDER — PROPOFOL 10 MG/ML IV BOLUS
INTRAVENOUS | Status: AC
  Filled 2014-05-31: qty 20

## 2014-05-31 MED ORDER — ONDANSETRON HCL 4 MG PO TABS: 4.0000 mg | ORAL_TABLET | Freq: Four times a day (QID) | ORAL | Status: DC | PRN

## 2014-05-31 MED ORDER — WARFARIN - PHARMACIST DOSING INPATIENT: Freq: Every day | Status: DC

## 2014-05-31 MED ORDER — SODIUM CHLORIDE 0.9 % IV SOLN: INTRAVENOUS | Status: DC

## 2014-05-31 MED ORDER — SODIUM CHLORIDE 0.9 % IJ SOLN
INTRAMUSCULAR | Status: AC
  Filled 2014-05-31: qty 50

## 2014-05-31 MED ORDER — FLUTICASONE PROPIONATE 50 MCG/ACT NA SUSP
2.0000 | Freq: Every day | NASAL | Status: DC
  Filled 2014-05-31: qty 16

## 2014-05-31 MED ORDER — LEVOTHYROXINE SODIUM 75 MCG PO TABS
75.0000 ug | ORAL_TABLET | Freq: Every day | ORAL | Status: DC
  Administered 2014-06-01: 75 ug via ORAL
  Filled 2014-05-31 (×2): qty 1

## 2014-05-31 MED ORDER — BUPIVACAINE LIPOSOME 1.3 % IJ SUSP
INTRAMUSCULAR | Status: DC | PRN
  Administered 2014-05-31: 50 mL

## 2014-05-31 MED ORDER — MENTHOL 3 MG MT LOZG: 1.0000 | LOZENGE | OROMUCOSAL | Status: DC | PRN

## 2014-05-31 MED ORDER — DEXAMETHASONE SODIUM PHOSPHATE 10 MG/ML IJ SOLN
10.0000 mg | Freq: Once | INTRAMUSCULAR | Status: AC
  Administered 2014-05-31: 10 mg via INTRAVENOUS

## 2014-05-31 MED ORDER — ENOXAPARIN SODIUM 30 MG/0.3ML ~~LOC~~ SOLN
30.0000 mg | Freq: Two times a day (BID) | SUBCUTANEOUS | Status: DC
  Administered 2014-06-01: 30 mg via SUBCUTANEOUS
  Filled 2014-05-31 (×3): qty 0.3

## 2014-05-31 MED ORDER — DOCUSATE SODIUM 100 MG PO CAPS
100.0000 mg | ORAL_CAPSULE | Freq: Two times a day (BID) | ORAL | Status: DC
  Administered 2014-05-31 – 2014-06-01 (×3): 100 mg via ORAL

## 2014-05-31 MED ORDER — ESCITALOPRAM OXALATE 5 MG PO TABS
5.0000 mg | ORAL_TABLET | Freq: Every morning | ORAL | Status: DC
  Administered 2014-06-01: 5 mg via ORAL
  Filled 2014-05-31: qty 1

## 2014-05-31 MED ORDER — PHENOL 1.4 % MT LIQD
1.0000 | OROMUCOSAL | Status: DC | PRN
  Filled 2014-05-31: qty 177

## 2014-05-31 MED ORDER — BUPIVACAINE HCL (PF) 0.25 % IJ SOLN
INTRAMUSCULAR | Status: DC | PRN
  Administered 2014-05-31: 20 mL

## 2014-05-31 MED ORDER — FENTANYL CITRATE 0.05 MG/ML IJ SOLN
25.0000 ug | INTRAMUSCULAR | Status: DC | PRN
  Administered 2014-05-31: 50 ug via INTRAVENOUS
  Administered 2014-05-31: 25 ug via INTRAVENOUS

## 2014-05-31 MED ORDER — BISACODYL 10 MG RE SUPP: 10.0000 mg | Freq: Every day | RECTAL | Status: DC | PRN

## 2014-05-31 MED ORDER — FENTANYL CITRATE 0.05 MG/ML IJ SOLN
INTRAMUSCULAR | Status: AC
  Filled 2014-05-31: qty 2

## 2014-05-31 MED ORDER — LACTATED RINGERS IV SOLN: INTRAVENOUS | Status: DC

## 2014-05-31 MED ORDER — BUPIVACAINE LIPOSOME 1.3 % IJ SUSP
20.0000 mL | Freq: Once | INTRAMUSCULAR | Status: DC
  Filled 2014-05-31: qty 20

## 2014-05-31 MED ORDER — METOCLOPRAMIDE HCL 10 MG PO TABS: 5.0000 mg | ORAL_TABLET | Freq: Three times a day (TID) | ORAL | Status: DC | PRN

## 2014-05-31 MED ORDER — ACETAMINOPHEN 325 MG PO TABS: 650.0000 mg | ORAL_TABLET | Freq: Four times a day (QID) | ORAL | Status: DC | PRN

## 2014-05-31 MED ORDER — SPIRONOLACTONE 25 MG PO TABS
25.0000 mg | ORAL_TABLET | Freq: Every morning | ORAL | Status: DC
  Administered 2014-06-01: 25 mg via ORAL
  Filled 2014-05-31: qty 1

## 2014-05-31 MED ORDER — LORATADINE 10 MG PO TABS
10.0000 mg | ORAL_TABLET | Freq: Every day | ORAL | Status: DC
  Administered 2014-06-01: 10 mg via ORAL
  Filled 2014-05-31: qty 1

## 2014-05-31 MED ORDER — MIDAZOLAM HCL 2 MG/2ML IJ SOLN
INTRAMUSCULAR | Status: AC
  Filled 2014-05-31: qty 2

## 2014-05-31 MED ORDER — PANTOPRAZOLE SODIUM 40 MG PO TBEC: 80.0000 mg | DELAYED_RELEASE_TABLET | Freq: Every day | ORAL | Status: DC | PRN

## 2014-05-31 MED ORDER — METOCLOPRAMIDE HCL 5 MG/ML IJ SOLN: 5.0000 mg | Freq: Three times a day (TID) | INTRAMUSCULAR | Status: DC | PRN

## 2014-05-31 MED ORDER — PROMETHAZINE HCL 25 MG/ML IJ SOLN: 6.2500 mg | INTRAMUSCULAR | Status: DC | PRN

## 2014-05-31 MED ORDER — BUPIVACAINE HCL (PF) 0.25 % IJ SOLN
INTRAMUSCULAR | Status: AC
  Filled 2014-05-31: qty 30

## 2014-05-31 MED ORDER — CARBOXYMETHYLCELLULOSE SODIUM 0.5 % OP SOLN: 2.0000 [drp] | Freq: Two times a day (BID) | OPHTHALMIC | Status: DC | PRN

## 2014-05-31 MED ORDER — FENTANYL CITRATE 0.05 MG/ML IJ SOLN
INTRAMUSCULAR | Status: DC | PRN
  Administered 2014-05-31 (×2): 50 ug via INTRAVENOUS

## 2014-05-31 MED ORDER — POLYETHYLENE GLYCOL 3350 17 G PO PACK: 17.0000 g | PACK | Freq: Every day | ORAL | Status: DC | PRN

## 2014-05-31 MED ORDER — DEXTROSE-NACL 5-0.9 % IV SOLN
INTRAVENOUS | Status: DC
  Administered 2014-05-31: 15:00:00 via INTRAVENOUS

## 2014-05-31 MED ORDER — TRAMADOL HCL 50 MG PO TABS
50.0000 mg | ORAL_TABLET | Freq: Four times a day (QID) | ORAL | Status: DC | PRN
  Administered 2014-06-01 (×3): 50 mg via ORAL
  Filled 2014-05-31 (×3): qty 1

## 2014-05-31 MED ORDER — ACETAMINOPHEN 650 MG RE SUPP: 650.0000 mg | Freq: Four times a day (QID) | RECTAL | Status: DC | PRN

## 2014-05-31 MED ORDER — WARFARIN SODIUM 4 MG PO TABS
4.0000 mg | ORAL_TABLET | Freq: Once | ORAL | Status: AC
  Administered 2014-05-31: 4 mg via ORAL
  Filled 2014-05-31: qty 1

## 2014-05-31 MED ORDER — HYDROMORPHONE HCL 2 MG PO TABS
2.0000 mg | ORAL_TABLET | ORAL | Status: DC | PRN
  Administered 2014-05-31: 2 mg via ORAL
  Filled 2014-05-31: qty 1

## 2014-05-31 MED ORDER — FLEET ENEMA 7-19 GM/118ML RE ENEM: 1.0000 | ENEMA | Freq: Once | RECTAL | Status: AC | PRN

## 2014-05-31 MED ORDER — METOPROLOL TARTRATE 12.5 MG HALF TABLET
12.5000 mg | ORAL_TABLET | Freq: Two times a day (BID) | ORAL | Status: DC
  Administered 2014-05-31 – 2014-06-01 (×2): 12.5 mg via ORAL
  Filled 2014-05-31 (×3): qty 1

## 2014-05-31 MED ORDER — METHOCARBAMOL 1000 MG/10ML IJ SOLN
500.0000 mg | Freq: Four times a day (QID) | INTRAVENOUS | Status: DC | PRN
  Filled 2014-05-31: qty 5

## 2014-05-31 MED ORDER — DIAZEPAM 5 MG PO TABS: 5.0000 mg | ORAL_TABLET | Freq: Two times a day (BID) | ORAL | Status: DC | PRN

## 2014-05-31 MED ORDER — MIDAZOLAM HCL 5 MG/5ML IJ SOLN
INTRAMUSCULAR | Status: DC | PRN
  Administered 2014-05-31: 2 mg via INTRAVENOUS

## 2014-05-31 MED ORDER — PROPOFOL 10 MG/ML IV BOLUS
INTRAVENOUS | Status: DC | PRN
  Administered 2014-05-31: 40 mg via INTRAVENOUS
  Administered 2014-05-31: 10 mg via INTRAVENOUS

## 2014-05-31 MED ORDER — SODIUM CHLORIDE 0.9 % IJ SOLN
INTRAMUSCULAR | Status: DC | PRN
  Administered 2014-05-31: 30 mL

## 2014-05-31 MED ORDER — NITROGLYCERIN 0.4 MG SL SUBL: 0.4000 mg | SUBLINGUAL_TABLET | SUBLINGUAL | Status: DC | PRN

## 2014-05-31 MED ORDER — LACTATED RINGERS IV SOLN
INTRAVENOUS | Status: DC | PRN
  Administered 2014-05-31: 10:00:00 via INTRAVENOUS

## 2014-05-31 MED ORDER — FAMOTIDINE 20 MG PO TABS
20.0000 mg | ORAL_TABLET | Freq: Every day | ORAL | Status: DC
Start: 2014-05-31 — End: 2014-06-01
  Administered 2014-05-31: 20 mg via ORAL
  Filled 2014-05-31 (×2): qty 1

## 2014-05-31 MED ORDER — ONDANSETRON HCL 4 MG/2ML IJ SOLN: 4.0000 mg | Freq: Four times a day (QID) | INTRAMUSCULAR | Status: DC | PRN

## 2014-05-31 MED ORDER — CEFAZOLIN SODIUM-DEXTROSE 2-3 GM-% IV SOLR
INTRAVENOUS | Status: AC
  Filled 2014-05-31: qty 50

## 2014-05-31 MED ORDER — ACETAMINOPHEN 500 MG PO TABS
1000.0000 mg | ORAL_TABLET | Freq: Four times a day (QID) | ORAL | Status: AC
  Administered 2014-05-31 – 2014-06-01 (×4): 1000 mg via ORAL
  Filled 2014-05-31 (×4): qty 2

## 2014-05-31 MED ORDER — BUPIVACAINE IN DEXTROSE 0.75-8.25 % IT SOLN
INTRATHECAL | Status: DC | PRN
  Administered 2014-05-31: 1.8 mL via INTRATHECAL

## 2014-05-31 MED ORDER — CEFAZOLIN SODIUM-DEXTROSE 2-3 GM-% IV SOLR
2.0000 g | Freq: Four times a day (QID) | INTRAVENOUS | Status: AC
  Administered 2014-05-31 (×2): 2 g via INTRAVENOUS
  Filled 2014-05-31 (×2): qty 50

## 2014-05-31 MED ORDER — CEFAZOLIN SODIUM-DEXTROSE 2-3 GM-% IV SOLR
2.0000 g | INTRAVENOUS | Status: AC
  Administered 2014-05-31: 2 g via INTRAVENOUS

## 2014-05-31 MED ORDER — DIPHENHYDRAMINE HCL 12.5 MG/5ML PO ELIX: 12.5000 mg | ORAL_SOLUTION | ORAL | Status: DC | PRN

## 2014-05-31 MED ORDER — CHLORHEXIDINE GLUCONATE 4 % EX LIQD: 60.0000 mL | Freq: Once | CUTANEOUS | Status: DC

## 2014-05-31 MED ORDER — ACETAMINOPHEN 10 MG/ML IV SOLN
1000.0000 mg | Freq: Once | INTRAVENOUS | Status: AC
  Administered 2014-05-31: 1000 mg via INTRAVENOUS
  Filled 2014-05-31: qty 100

## 2014-05-31 MED ORDER — POLYVINYL ALCOHOL 1.4 % OP SOLN
2.0000 [drp] | Freq: Two times a day (BID) | OPHTHALMIC | Status: DC | PRN
  Filled 2014-05-31: qty 15

## 2014-05-31 MED ORDER — PROPOFOL INFUSION 10 MG/ML OPTIME
INTRAVENOUS | Status: DC | PRN
  Administered 2014-05-31: 75 ug/kg/min via INTRAVENOUS

## 2014-05-31 MED ORDER — ASPIRIN 81 MG PO CHEW
81.0000 mg | CHEWABLE_TABLET | Freq: Once | ORAL | Status: AC
  Administered 2014-05-31: 81 mg via ORAL
  Filled 2014-05-31: qty 1

## 2014-05-31 MED ORDER — DEXAMETHASONE SODIUM PHOSPHATE 10 MG/ML IJ SOLN
INTRAMUSCULAR | Status: AC
  Filled 2014-05-31: qty 1

## 2014-05-31 MED ORDER — HYDROMORPHONE HCL 1 MG/ML IJ SOLN
0.5000 mg | INTRAMUSCULAR | Status: DC | PRN
  Administered 2014-05-31: 1 mg via INTRAVENOUS
  Administered 2014-05-31: 0.5 mg via INTRAVENOUS
  Filled 2014-05-31 (×2): qty 1

## 2014-05-31 MED ORDER — METHOCARBAMOL 500 MG PO TABS
500.0000 mg | ORAL_TABLET | Freq: Four times a day (QID) | ORAL | Status: DC | PRN
  Administered 2014-06-01 (×2): 500 mg via ORAL
  Filled 2014-05-31 (×2): qty 1

## 2014-05-31 MED ORDER — FUROSEMIDE 20 MG PO TABS
20.0000 mg | ORAL_TABLET | Freq: Every day | ORAL | Status: DC | PRN
  Filled 2014-05-31: qty 1

## 2014-05-31 MED ORDER — TRANEXAMIC ACID 100 MG/ML IV SOLN
2000.0000 mg | Freq: Once | INTRAVENOUS | Status: DC
  Filled 2014-05-31: qty 20

## 2014-05-31 MED ORDER — MEPERIDINE HCL 50 MG/ML IJ SOLN: 6.2500 mg | INTRAMUSCULAR | Status: DC | PRN

## 2014-05-31 SURGICAL SUPPLY — 58 items
BAG DECANTER FOR FLEXI CONT (MISCELLANEOUS) ×3 IMPLANT
BAG SPEC THK2 15X12 ZIP CLS (MISCELLANEOUS)
BAG ZIPLOCK 12X15 (MISCELLANEOUS) IMPLANT
BANDAGE ELASTIC 6 VELCRO ST LF (GAUZE/BANDAGES/DRESSINGS) ×3 IMPLANT
BANDAGE ESMARK 6X9 LF (GAUZE/BANDAGES/DRESSINGS) ×1 IMPLANT
BLADE SAW RECIPROCATING 77.5 (BLADE) ×3 IMPLANT
BLADE SAW SGTL 13.0X1.19X90.0M (BLADE) ×3 IMPLANT
BNDG CMPR 9X6 STRL LF SNTH (GAUZE/BANDAGES/DRESSINGS) ×1
BNDG ESMARK 6X9 LF (GAUZE/BANDAGES/DRESSINGS) ×3
BOWL SMART MIX CTS (DISPOSABLE) ×3 IMPLANT
BUR OVAL CARBIDE 4.0 (BURR) ×3 IMPLANT
CAPT KNEE PARTIAL 2 ×3 IMPLANT
CEMENT HV SMART SET (Cement) ×3 IMPLANT
CLOSURE WOUND 1/2 X4 (GAUZE/BANDAGES/DRESSINGS) ×1
CUFF TOURN SGL QUICK 34 (TOURNIQUET CUFF) ×3
CUFF TRNQT CYL 34X4X40X1 (TOURNIQUET CUFF) ×1 IMPLANT
DRAPE EXTREMITY T 121X128X90 (DRAPE) ×3 IMPLANT
DRAPE POUCH INSTRU U-SHP 10X18 (DRAPES) ×3 IMPLANT
DRSG ADAPTIC 3X8 NADH LF (GAUZE/BANDAGES/DRESSINGS) ×3 IMPLANT
DRSG PAD ABDOMINAL 8X10 ST (GAUZE/BANDAGES/DRESSINGS) ×3 IMPLANT
DURAPREP 26ML APPLICATOR (WOUND CARE) ×3 IMPLANT
ELECT REM PT RETURN 9FT ADLT (ELECTROSURGICAL) ×3
ELECTRODE REM PT RTRN 9FT ADLT (ELECTROSURGICAL) ×1 IMPLANT
EVACUATOR 1/8 PVC DRAIN (DRAIN) ×3 IMPLANT
FACESHIELD WRAPAROUND (MASK) ×15 IMPLANT
FACESHIELD WRAPAROUND OR TEAM (MASK) ×5 IMPLANT
GAUZE SPONGE 4X4 12PLY STRL (GAUZE/BANDAGES/DRESSINGS) ×3 IMPLANT
GLOVE BIO SURGEON STRL SZ7.5 (GLOVE) ×3 IMPLANT
GLOVE BIO SURGEON STRL SZ8 (GLOVE) ×3 IMPLANT
GLOVE BIOGEL PI IND STRL 7.0 (GLOVE) IMPLANT
GLOVE BIOGEL PI IND STRL 8 (GLOVE) ×2 IMPLANT
GLOVE BIOGEL PI INDICATOR 7.0 (GLOVE) ×2
GLOVE BIOGEL PI INDICATOR 8 (GLOVE) ×4
GOWN STRL REUS W/TWL LRG LVL3 (GOWN DISPOSABLE) ×3 IMPLANT
GOWN STRL REUS W/TWL XL LVL3 (GOWN DISPOSABLE) ×3 IMPLANT
HANDPIECE INTERPULSE COAX TIP (DISPOSABLE) ×3
IMMOBILIZER KNEE 20 (SOFTGOODS) ×3
IMMOBILIZER KNEE 20 THIGH 36 (SOFTGOODS) ×1 IMPLANT
KIT BASIN OR (CUSTOM PROCEDURE TRAY) ×3 IMPLANT
KIT IMPL STRL TIB IPOLY IUNI IMPLANT
MANIFOLD NEPTUNE II (INSTRUMENTS) ×3 IMPLANT
NDL SAFETY ECLIPSE 18X1.5 (NEEDLE) ×2 IMPLANT
NEEDLE HYPO 18GX1.5 SHARP (NEEDLE) ×6
PACK TOTAL JOINT (CUSTOM PROCEDURE TRAY) ×3 IMPLANT
PADDING CAST COTTON 6X4 STRL (CAST SUPPLIES) ×4 IMPLANT
POSITIONER SURGICAL ARM (MISCELLANEOUS) ×3 IMPLANT
SET HNDPC FAN SPRY TIP SCT (DISPOSABLE) ×1 IMPLANT
STRIP CLOSURE SKIN 1/2X4 (GAUZE/BANDAGES/DRESSINGS) ×3 IMPLANT
SUCTION FRAZIER 12FR DISP (SUCTIONS) ×3 IMPLANT
SUT MNCRL AB 4-0 PS2 18 (SUTURE) ×3 IMPLANT
SUT VIC AB 2-0 CT1 27 (SUTURE) ×12
SUT VIC AB 2-0 CT1 TAPERPNT 27 (SUTURE) ×2 IMPLANT
SUT VLOC 180 0 24IN GS25 (SUTURE) ×3 IMPLANT
SYR 20CC LL (SYRINGE) ×3 IMPLANT
SYR 50ML LL SCALE MARK (SYRINGE) ×3 IMPLANT
TOWEL OR 17X26 10 PK STRL BLUE (TOWEL DISPOSABLE) ×3 IMPLANT
TRAY FOLEY CATH 14FRSI W/METER (CATHETERS) ×3 IMPLANT
WRAP KNEE MAXI GEL POST OP (GAUZE/BANDAGES/DRESSINGS) ×2 IMPLANT

## 2014-05-31 NOTE — Anesthesia Postprocedure Evaluation (Signed)
Anesthesia Post Note  Patient: [name]  Procedure(s) Performed: Procedure(s) (LRB): UNICOMPARTMENTAL LEFT KNEE (Left)  Anesthesia type: Spinal  Patient location: PACU  Post pain: Pain level controlled  Post assessment: Post-op Vital signs reviewed  Last Vitals: BP 133/71 mmHg  Pulse 62  Temp(Src) 36.5 C (Oral)  Resp 16  Ht 5\' 5"  (1.651 m)  Wt 158 lb 7 oz (71.867 kg)  BMI 26.37 kg/m2  SpO2 95%  Post vital signs: Reviewed  Level of consciousness: sedated  Complications: No apparent anesthesia complications

## 2014-05-31 NOTE — H&P (View-Only) (Signed)
[name] DOB: [birth year] Married / Language: English / Race: White Female Date of Admission: 05/31/2014 CC:  Left Knee Pain History of Present Illness The patient is a 74 year old female who comes in for a preoperative History and Physical. The patient is scheduled for a left uni knee replacement to be performed by Dr. Dione Plover. Aluisio, MD at [facility] on 05/31/2014. The patient is a 74 year old female who presents for follow up of their knee. The patient is being followed for their bilateral knee pain and osteoarthritis. Symptoms reported today include: pain (left worse than right). The patient feels that they are doing poorly and report their pain level to be moderate to severe. Current treatment includes: Cosamine ASU, Tylenol, and Voltaren Gel. The following medication has been used for pain control: Tylenol (prn). The patient presents today following 10 weeks post Euflexxa series. Euflexxa series did not help any w/ the knees. In fact she feels that she is doing worse than before. She is having more pain in the left knee than the right knee now. She is having a lot of stiffness in the left knee. She is not having pain at rest but is severely limited in her activity. She had a MI in April of last year and a PE in May of last year. She reports that she is currently doing well in regards to her health. She originally had the right knee scheduled to be replaced in May of 2015 but feels she needs the left knee replaced at this time. She has had cortisone injections and also Visco supplements. Nothing has really helped long-term. At this point, the most predictable means of pain and functional improvement would be a unicompartmental replacement. She has received her clearance and ready to proceed. They have been treated conservatively in the past for the above stated problem and despite conservative measures, they continue to have progressive pain and severe functional limitations and  dysfunction. They have failed non-operative management including home exercise, medications, and injections. It is felt that they would benefit from undergoing uni joint replacement. Risks and benefits of the procedure have been discussed with the patient and they elect to proceed with surgery. There are no active contraindications to surgery such as ongoing infection or rapidly progressive neurological disease.  Problem List/Past Medical  Right knee pain (M25.561) Primary osteoarthritis of first carpometacarpal joint of right hand (M18.11) S/P arthroscopy of knee (V45.89) Acute medial meniscal tear (P37.902I) Internal derangement of knee joint (717.9) Primary osteoarthritis of left knee (M17.12) Breast Cancer Avoid Left Arm, Right Arm is better Hiatal Hernia Gastroesophageal Reflux Disease Anemia Osteoarthritis Osteoporosis Vertigo Anxiety Disorder Tinnitus Asthma mild, slight Pneumonia Once Coronary Artery Disease/Heart Disease Myocardial infarction 3/07, 3/13, 4/15 Hypercholesterolemia Diverticulitis Of Colon Once Urinary Tract Infection Hypothyroidism Deep vein thrombosis Pulmonary Embolism Menopause Breast disease  Allergies Codeine/Codeine Derivatives Morphine Derivatives  Family History Heart disease in female family member before age 65 Bleeding disorder brother Diabetes Mellitus brother Cerebrovascular Accident mother Congestive Heart Failure First Degree Relatives. mother Hypertension father and brother Cancer brother Osteoarthritis First Degree Relatives. mother and brother Heart Disease mother, father and brother  Social History Marital status married Illicit drug use no Thyroidectomy; Subtotal Tobacco / smoke exposure no Number of flights of stairs before winded 2-3 Living situation live with spouse Sinus Surgery Alcohol use never consumed alcohol Copy of Drug/Alcohol Rehab (Previously)  no Tonsillectomy Straighten Nasal Septum Mastectomy bilateral Tobacco use Never smoker. never smoker Cataract Surgery bilateral  Pain Contract no Breast Biopsy bilateral, multiple times Breast Mass; Local Excision bilateral Current work status retired Games developer weekly; does other and gym / [employer] Drug/Alcohol Rehab (Currently) no Children 0 Advance Directives Living Will, Healthcare POA  Medication History Metoprolol Tartrate (25MG  Tablet, Oral) Active. Coumadin (Oral) Specific dose unknown - Active. Aspirin EC (81MG  Tablet DR, Oral) Active. Flaxseed Oil (Oral) Specific dose unknown - Active. Levothyroxine Sodium (75MCG Tablet, Oral) Active. Ranitidine HCl (150MG  Tablet, Oral) Active. Vitamin D3 (Oral) Specific dose unknown - Active. Cosamin ASU Advanced Formula (Oral) Active. Nitrostat (0.4MG  Tab Sublingual, Sublingual) Active. (prn) Caltrate 600 + D (600-200MG -IU Tablet, Oral) Active. Docusate Sodium (100MG  Capsule, Oral) Active. Magnesium Hydroxide (Oral) Specific dose unknown - Active. Pravastatin Sodium (20MG  Tablet, Oral) Active. Vitamin B-12 (1000MCG Tablet, 1 Oral) Active.  Past Surgical History Breast cancer (174.9) 1999 left/2008 right  Review of Systems General Not Present- Chills, Fatigue, Fever, Memory Loss, Night Sweats, Weight Gain and Weight Loss. Skin Not Present- Eczema, Hives, Itching, Lesions and Rash. HEENT Present- Tinnitus. Not Present- Dentures, Double Vision, Headache, Hearing Loss and Visual Loss. Respiratory Present- Shortness of breath with exertion. Not Present- Allergies, Chronic Cough, Coughing up blood and Shortness of breath at rest. Cardiovascular Present- Palpitations. Not Present- Chest Pain, Difficulty Breathing Lying Down, Murmur, Racing/skipping heartbeats and Swelling. Gastrointestinal Present- Heartburn. Not Present- Abdominal Pain, Bloody Stool, Constipation, Diarrhea, Difficulty Swallowing,  Jaundice, Loss of appetitie, Nausea and Vomiting. Female Genitourinary Present- Urinating at Night. Not Present- Blood in Urine, Discharge, Flank Pain, Incontinence, Painful Urination, Urgency, Urinary frequency, Urinary Retention and Weak urinary stream. Musculoskeletal Present- Joint Pain, Joint Swelling and Morning Stiffness. Not Present- Back Pain, Muscle Pain, Muscle Weakness and Spasms. Neurological Not Present- Blackout spells, Difficulty with balance, Dizziness, Paralysis, Tremor and Weakness. Psychiatric Not Present- Insomnia.  Vitals Weight: 151.5 lb Height: 65in Body Surface Area: 1.76 m Body Mass Index: 25.21 kg/m  BP: 114/58 (Sitting, Right Arm, Standard)  Physical Exam General Mental Status -Alert, cooperative and good historian. General Appearance-pleasant, Not in acute distress. Orientation-Oriented X3. Build & Nutrition-Well nourished and Well developed.  Head and Neck Head-normocephalic, atraumatic . Neck Global Assessment - supple, no bruit auscultated on the right, no bruit auscultated on the left.  Eye Pupil - Bilateral-Regular and Round. Motion - Bilateral-EOMI.  Chest and Lung Exam Auscultation Breath sounds - clear at anterior chest wall and clear at posterior chest wall. Adventitious sounds - No Adventitious sounds.  Cardiovascular Auscultation Rhythm - Regular rate and rhythm. Heart Sounds - S1 WNL and S2 WNL. Murmurs & Other Heart Sounds - Auscultation of the heart reveals - No Murmurs.  Abdomen Palpation/Percussion Tenderness - Abdomen is non-tender to palpation. Rigidity (guarding) - Abdomen is soft. Auscultation Auscultation of the abdomen reveals - Bowel sounds normal.  Female Genitourinary Note: Not done, not pertinent to present illness  Musculoskeletal Note: Examination reveals a well-developed female in no distress. Her left knee shows no effusion. Range is about 5 to 125, tender along the medial joint line,  but there is no lateral joint line tenderness or instability. The right knee has no effusion. Range is zero to 125, slightly tender medial greater than lateral, no instability noted.  RADIOGRAPHS: Review of her x-rays AP of both knees and lateral show that she has significant medial compartment arthritis on the left knee. She does not have any significant patellofemoral or lateral compartment disease. The right knee shows medial arthritis also.  Assessment & Plan Primary osteoarthritis of left knee (M17.12) Note:Surgical  Plans: Left Uni Knee Repalcement  Disposition: Home  PCP: Dr. Emily Filbert - Patient has been seen preoperatively and felt to be stable for surgery. Cardiology: Dr. Alycia Rossetti - Patient has been seen preoperatively and felt to be stable for surgery.  Topical TXA - Heart Disease, Breast Cancer, DVT, PE  Anesthesia Issues: Slow to wake up "years ago"  Signed electronically by Joelene Millin, III PA-C

## 2014-05-31 NOTE — Anesthesia Preprocedure Evaluation (Addendum)
Anesthesia Evaluation  Patient identified by MRN, date of birth, ID band Patient awake    Reviewed: Allergy & Precautions, NPO status , Patient's Chart, lab work & pertinent test results  Airway Mallampati: II  TM Distance: >3 FB Neck ROM: Full    Dental no notable dental hx.    Pulmonary shortness of breath, asthma ,  breath sounds clear to auscultation  Pulmonary exam normal       Cardiovascular hypertension, Pt. on medications + CAD, + Past MI and + Peripheral Vascular Disease Rhythm:Regular Rate:Normal  Echo 05/2013 Left ventricle: Systolic function was mildly to moderately reduced. The estimated ejection fraction was in the range of 40% to 45%. Akinesis of the midinferior myocardium; consistent with infarction. There was an increased relative contribution of atrial contraction to ventricular filling.    Neuro/Psych PSYCHIATRIC DISORDERS Anxiety Depression negative neurological ROS     GI/Hepatic Neg liver ROS, GERD-  ,  Endo/Other  negative endocrine ROSHypothyroidism   Renal/GU negative Renal ROS     Musculoskeletal  (+) Arthritis -,   Abdominal   Peds  Hematology negative hematology ROS (+)   Anesthesia Other Findings   Reproductive/Obstetrics negative OB ROS                          Anesthesia Physical Anesthesia Plan  ASA: III  Anesthesia Plan: Spinal   Post-op Pain Management:    Induction:   Airway Management Planned:   Additional Equipment:   Intra-op Plan:   Post-operative Plan:   Informed Consent: I have reviewed the patients History and Physical, chart, labs and discussed the procedure including the risks, benefits and alternatives for the proposed anesthesia with the patient or authorized representative who has indicated his/her understanding and acceptance.   Dental advisory given  Plan Discussed with: CRNA  Anesthesia Plan Comments: (Discussed risk of )        Anesthesia Quick Evaluation

## 2014-05-31 NOTE — Evaluation (Signed)
Physical Therapy Evaluation Patient Details Name: [name] MRN: [id number] DOB: [birth year] Today's Date: 05/31/2014   History of Present Illness  L UKA  Clinical Impression  Limited by pain. Plans to Dc tomorrow. Patient will benefit from PT to address problems listed in note below.   Follow Up Recommendations Home health PT;Supervision/Assistance - 24 hour    Equipment Recommendations  None recommended by PT    Recommendations for Other Services       Precautions / Restrictions Precautions Precautions: Knee Required Braces or Orthoses: Knee Immobilizer - Left Knee Immobilizer - Left: Discontinue once straight leg raise with < 10 degree lag      Mobility  Bed Mobility Overal bed mobility: Needs Assistance Bed Mobility: Supine to Sit     Supine to sit: Min assist        Transfers Overall transfer level: Needs assistance Equipment used: Rolling walker (2 wheeled) Transfers: Sit to/from Stand Sit to Stand: Min assist         General transfer comment: cues for  hand and LLE  Ambulation/Gait Ambulation/Gait assistance: Min assist Ambulation Distance (Feet): 5 Feet Assistive device: Rolling walker (2 wheeled) Gait Pattern/deviations: Step-to pattern     General Gait Details: cues for sequence,  Stairs            Wheelchair Mobility    Modified Rankin (Stroke Patients Only)       Balance                                             Pertinent Vitals/Pain Pain Assessment: 0-10 Pain Score: 4  Pain Descriptors / Indicators: Aching Pain Intervention(s): Limited activity within patient's tolerance;Patient requesting pain meds-RN notified;Ice applied    Home Living Family/patient expects to be discharged to:: Private residence   Available Help at Discharge: Family Type of Home: House Home Access: Stairs to enter Entrance Stairs-Rails: None Entrance Stairs-Number of Steps: 1 Home Layout: One level Home Equipment: Environmental consultant  - 2 wheels      Prior Function Level of Independence: Independent               Hand Dominance        Extremity/Trunk Assessment               Lower Extremity Assessment: LLE deficits/detail   LLE Deficits / Details: assist for SLR     Communication   Communication: No difficulties  Cognition Arousal/Alertness: Awake/alert Behavior During Therapy: WFL for tasks assessed/performed Overall Cognitive Status: Within Functional Limits for tasks assessed                      General Comments      Exercises        Assessment/Plan    PT Assessment Patient needs continued PT services  PT Diagnosis Difficulty walking;Acute pain   PT Problem List Decreased strength;Decreased range of motion;Decreased activity tolerance;Decreased mobility;Decreased knowledge of precautions;Decreased safety awareness;Decreased knowledge of use of DME;Pain  PT Treatment Interventions DME instruction;Gait training;Stair training;Functional mobility training;Therapeutic activities;Therapeutic exercise;Patient/family education   PT Goals (Current goals can be found in the Care Plan section) Acute Rehab PT Goals Patient Stated Goal: to go home PT Goal Formulation: With patient/family Time For Goal Achievement: 06/03/14 Potential to Achieve Goals: Good    Frequency 7X/week   Barriers to discharge  Co-evaluation               End of Session Equipment Utilized During Treatment: Left knee immobilizer Activity Tolerance: Patient limited by pain Patient left: in chair;with call bell/phone within reach;with family/visitor present Nurse Communication: Mobility status         Time: 2683-4196 PT Time Calculation (min) (ACUTE ONLY): 27 min   Charges:   PT Evaluation $Initial PT Evaluation Tier I: 1 Procedure PT Treatments $Gait Training: 8-22 mins   PT G Codes:        [name] 05/31/2014, 7:11 PM

## 2014-05-31 NOTE — Anesthesia Procedure Notes (Signed)
Spinal Patient location during procedure: OR Start time: 05/31/2014 10:35 AM End time: 05/31/2014 10:40 AM Reason for block: at surgeon's request Staffing Resident/CRNA: Anne Fu Performed by: resident/CRNA  Preanesthetic Checklist Completed: patient identified, site marked, surgical consent, pre-op evaluation, timeout performed, IV checked, risks and benefits discussed, monitors and equipment checked and at surgeon's request Spinal Block Patient position: sitting Approach: right paramedian Location: L2-3 Injection technique: single-shot Needle Needle type: Spinocan  Needle gauge: 22 G Needle length: 9 cm Assessment Sensory level: T6 Additional Notes Expiration date of kit checked and confirmed. Patient tolerated procedure well, without complications. X 1 attempt with noted clear CSF return. Loss of motor and sensory on exam post injection.

## 2014-05-31 NOTE — Interval H&P Note (Signed)
History and Physical Interval Note:  05/31/2014 10:17 AM  [name]  has presented today for surgery, with the diagnosis of left knee osteoarthritis  The various methods of treatment have been discussed with the patient and family. After consideration of risks, benefits and other options for treatment, the patient has consented to  Procedure(s): UNICOMPARTMENTAL LEFT KNEE (Left) as a surgical intervention .  The patient's history has been reviewed, patient examined, no change in status, stable for surgery.  I have reviewed the patient's chart and labs.  Questions were answered to the patient's satisfaction.     [name]

## 2014-05-31 NOTE — Op Note (Signed)
OPERATIVE REPORT  PREOPERATIVE DIAGNOSIS: Medial compartment osteoarthritis, Left knee  POSTOPERATIVE DIAGNOSIS: Medial compartment osteoarthritis, Left knee  PROCEDURE:Left knee medial unicompartmental arthroplasty.   SURGEON: Gaynelle Arabian, MD   ASSISTANT: Arlee Muslim, PA-C  ANESTHESIA:  Spinal.   ESTIMATED BLOOD LOSS: Minimal.   DRAINS: Hemovac x1.   TOURNIQUET TIME: 32 minutes at 300 mmHg.   COMPLICATIONS: None.   CONDITION: Stable to recovery.   BRIEF CLINICAL NOTE:[name] is a 74 y.o. female, who has  significant isolated medial compartment arthritis of the Left knee. She has had nonoperative management including injections. She has had  cortisone and viscous supplements. Unfortunately, the pain persists.  Radiograph showed isolated medial compartment bone-on-bone arthritis  with normal-appearing patellofemoral and lateral compartments. She  presents now for left knee unicompartmental arthroplasty.   PROCEDURE IN DETAIL: After successful administration of  Spinal anesthetic, a tourniquet was placed high on the  Left thigh and left lower extremity prepped and draped in usual sterile fashion. Extremity was wrapped in an Esmarch, knee flexed, and tourniquet inflated to 300 mmHg. A midline incision was made with a 10 blade through subcutaneous  tissue to the extensor mechanism. A fresh blade was used to make a  medial parapatellar arthrotomy. Soft tissue on the proximal medial  tibia subperiosteally elevated to the joint line with a knife and into  the semimembranosus bursa with a Cobb elevator. The patella was  subluxed laterally, and the knee flexed 90 degrees. The ACL was intact.  The marginal osteophytes on the medial femur and tibia were removed with  a rongeur. The medial meniscus was also removed. The femoral cutting  block where the conformis unicompartmental knee system was placed along  the femur. There was excellent fit. I traced the  outline. We then  removed any remaining cartilage within this outline. We then placed the  cutting block again and pinned in position. The posterior femoral cut  was made, it was approximately 5 mm. The lug holes for the femoral  component were then drilled through the cutting block. The cutting  block was subsequently removed. We then utilized the high speed burr to  create a small trough at the superior aspect of the components that of  the inset and would not overhang the cartilage. The trial was placed,  it had excellent fit. The trial was subsequently removed.       The trial was placed again and the B chip was placed. There was  excellent balance throughout full motion. Also with excellent fit on  her tibia. This was removed as was the femoral trial. A curette was  used to remove any remaining cartilage from the tibia. The tibial  cutting block was then placed and there was a perfect fit on the tibial  surface. The appropriate slope was placed and it was pinned in  position. The reciprocating saw was used to make the central cut and  then the oscillating saw used to make the horizontal cut. The bone  fragment was then removed. The tibial trial was placed and had perfect  fit on the tibia. We then drilled the 2 lug holes and did the keel punch.  We then placed tibia trial femur, and a 6 mm trial insert. There was  excellent stability throughout full range of motion and no impingement.  The trial was then removed. We drilled small holes in the distal  femur in order to create more conduits for the cement. The cut bone  surfaces were  thoroughly irrigated with pulsatile lavage while the  cement was mixed on the back table. We then cemented the tibial  component into place, impacted it and removed the extruded cement. The  same was done for the femoral component. Trial 6-mm inserts placed,  knee held in full extension, and all extruded cement removed. While the  cement was hardening, I  injected the extensor mechanism, periosteum of  the femur and subcu tissues, a total of 20 mL of Exparel mixed with 30  mL of saline and then did an additional injection of 20 mL of 0.25%  Marcaine into the same tissues. When the cement had fully hardened,  then the permanent polyethylene was placed in tibial tray. There was  excellent stability throughout full range of motion with no lift off the  component and no evidence of any impingement. Wound was copiously  irrigated with saline solution, and the arthrotomy closed over a Hemovac  drain with a running #1 V-Loc suture. The subcutaneous was closed with  interrupted 2-0 Vicryl and subcuticular running 4-0 Monocryl. The drain  was hooked to suction. Incision cleaned and dried and Steri-Strips and  a bulky sterile dressing applied. The tourniquet was released after a  total time of 32 minutes. This was done after closing the extensor  mechanism. The wound was closed and a bulky sterile dressing was  applied. She was placed into a knee immobilizer, awakened and  transported to recovery room in stable condition.  Please note that a surgical assistant was a medical necessity for this  procedure in order to perform it in a safe and expeditious manner.  Assistance was necessary for retracting vital ligaments, neurovascular  structures, as well as for proper positioning of the limb to allow for  appropriate bone cuts and appropriate placement of the prosthesis.    Rachel Plover Josuel Koeppen, MD

## 2014-05-31 NOTE — Progress Notes (Signed)
ANTICOAGULATION CONSULT NOTE - Initial Consult  Pharmacy Consult for warfarin Indication:VTE Prophylaxis  Allergies  Allergen Reactions  . Amiodarone Nausea And Vomiting  . Codeine Nausea And Vomiting  . Hydrocodone Nausea And Vomiting  . Morphine Nausea And Vomiting  . Prednisone     Keeps her awake  . Statins Other (See Comments)    Leg cramps" Lipitor only"  . Procaine Hcl Palpitations    Patient Measurements: Height: 5\' 5"  (165.1 cm) Weight: 158 lb 7 oz (71.867 kg) IBW/kg (Calculated) : 57   Vital Signs: Temp: 97.7 F (36.5 C) (04/11 1330) Temp Source: Oral (04/11 0725) BP: 133/71 mmHg (04/11 1330) Pulse Rate: 62 (04/11 1330)  Labs:  Recent Labs  05/31/14 0740  LABPROT 14.0  INR 1.07    Estimated Creatinine Clearance: 58.6 mL/min (by C-G formula based on Cr of 0.85).   Medical History: Past Medical History  Diagnosis Date  . Allergic rhinitis   . Breast cancer 1999, 2008    s/p bilat mastectomy  . Pulmonary nodules     no change CT chest 3/10  . GERD (gastroesophageal reflux disease)   . HTN (hypertension)   . Hyperlipidemia   . NSVT (nonsustained ventricular tachycardia)     a. 05/2013 after MI->amio caused nausea->BB only.  . Hypothyroid   . Ischemic cardiomyopathy     a. 05/2013 Echo: EF 40-45%, midinferior AK.  . Takotsubo cardiomyopathy     a. 2013 with subsequent recovery of LV fxn (followed by MI in 05/2013 with LV dysfxn).  Marland Kitchen CAD (coronary artery disease)     a. H/o nonobs dzs and vasospasm with takotsubo CM;  b. 05/2013 Inf STEMI/Cath: LM nl, LAD nl, LCX nondom 100d (failed PCI), RCA dominant, nl, RPDA 34m (42mm vessel-->Med Rx), EF 35-40%.  . Coronary vasospasm     some chest pain "last chest pressure episode 12'15 -evaluated at [facility], multiple testing-ruleout MI, "thought anxiety"  . 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 Depression   . Anxiety   . Pulmonary emboli     history of dx. 07-05-13  [facility]    Medications:  Scheduled:  . acetaminophen  1,000 mg Oral 4 times per day  .  ceFAZolin (ANCEF) IV  2 g Intravenous Q6H  . docusate sodium  100 mg Oral BID  . [START ON 06/01/2014] enoxaparin (LOVENOX) injection  30 mg Subcutaneous Q12H  . [START ON 06/01/2014] escitalopram  5 mg Oral q morning - 10a  . famotidine  20 mg Oral QHS  . fentaNYL      . fluticasone  2 spray Each Nare QHS  . [START ON 06/01/2014] levothyroxine  75 mcg Oral QAC breakfast  . loratadine  10 mg Oral Daily  . metoprolol tartrate  12.5 mg Oral BID  . [START ON 06/01/2014] spironolactone  25 mg Oral q morning - 10a    Assessment: 74 y.o female with hx of DVT/PE, here for left knee uni replacement.  Pharmacy consulted to dose warfarin.  Home dose warfarin 4.5mg  daily, LD 4/5 Enoxaparin bridge, 40mg  daily, LD 4/10  Today 4/11: INR 1.07 No significant drug interaction noted  Goal of Therapy:  INR 2-3   Plan:  Warfarin 4mg  x1 @ 1800 Daily INR/PT Follow for signs of bleeding Enoxaparin starting tomorrow, continue til INR >2  Dolly Rias RPh 05/31/2014, 1:51 PM Pager [phone]

## 2014-05-31 NOTE — Transfer of Care (Signed)
Immediate Anesthesia Transfer of Care Note  Patient: [name]  Procedure(s) Performed: Procedure(s) (LRB): UNICOMPARTMENTAL LEFT KNEE (Left)  Patient Location: PACU  Anesthesia Type: Spinal  Level of Consciousness: sedated, patient cooperative and responds to stimulation  Airway & Oxygen Therapy: Patient Spontanous Breathing and Patient connected to face mask oxgen  Post-op Assessment: Report given to PACU RN and Post -op Vital signs reviewed and stable  Post vital signs: Reviewed and stable  Complications: No apparent anesthesia complications S1 level on exam denied pain on assessment.

## 2014-06-01 ENCOUNTER — Encounter (HOSPITAL_COMMUNITY): Payer: Self-pay | Admitting: Orthopedic Surgery

## 2014-06-01 LAB — BASIC METABOLIC PANEL
Anion gap: 8 (ref 5–15)
BUN: 16 mg/dL (ref 6–23)
CHLORIDE: 105 mmol/L (ref 96–112)
CO2: 25 mmol/L (ref 19–32)
CREATININE: 0.73 mg/dL (ref 0.50–1.10)
Calcium: 8.7 mg/dL (ref 8.4–10.5)
GFR calc Af Amer: >90 mL/min (ref >90)
GFR, EST NON AFRICAN AMERICAN: 83 mL/min — AB (ref >90)
Glucose, Bld: 167 mg/dL — ABNORMAL HIGH (ref 70–99)
POTASSIUM: 4 mmol/L (ref 3.5–5.1)
Sodium: 138 mmol/L (ref 135–145)

## 2014-06-01 LAB — CBC
HCT: 35.5 % — ABNORMAL LOW (ref 36.0–46.0)
HEMOGLOBIN: 11 g/dL — AB (ref 12.0–15.0)
MCH: 25.8 pg — ABNORMAL LOW (ref 26.0–34.0)
MCHC: 31 g/dL (ref 30.0–36.0)
MCV: 83.1 fL (ref 78.0–100.0)
Platelets: 238 10*3/uL (ref 150–400)
RBC: 4.27 MIL/uL (ref 3.87–5.11)
RDW: 14.9 % (ref 11.5–15.5)
WBC: 8.6 10*3/uL (ref 4.0–10.5)

## 2014-06-01 LAB — PROTIME-INR
INR: 1.06 (ref 0.00–1.49)
Prothrombin Time: 13.9 seconds (ref 11.6–15.2)

## 2014-06-01 MED ORDER — ESOMEPRAZOLE MAGNESIUM 20 MG PO CPDR
20.0000 mg | DELAYED_RELEASE_CAPSULE | ORAL | Status: DC
  Administered 2014-06-01: 20 mg via ORAL
  Filled 2014-06-01 (×2): qty 1

## 2014-06-01 MED ORDER — METHOCARBAMOL 500 MG PO TABS: 500.0000 mg | ORAL_TABLET | Freq: Four times a day (QID) | ORAL | Status: DC | PRN

## 2014-06-01 MED ORDER — WARFARIN SODIUM 3 MG PO TABS: 4.5000 mg | ORAL_TABLET | Freq: Every day | ORAL | Status: DC

## 2014-06-01 MED ORDER — WARFARIN SODIUM 4 MG PO TABS
4.5000 mg | ORAL_TABLET | Freq: Once | ORAL | Status: DC
  Filled 2014-06-01: qty 1

## 2014-06-01 MED ORDER — ENOXAPARIN SODIUM 40 MG/0.4ML ~~LOC~~ SOLN: 40.0000 mg | SUBCUTANEOUS | Status: DC

## 2014-06-01 MED ORDER — TRAMADOL HCL 50 MG PO TABS: 50.0000 mg | ORAL_TABLET | Freq: Four times a day (QID) | ORAL | Status: DC | PRN

## 2014-06-01 MED ORDER — HYDROMORPHONE HCL 2 MG PO TABS: 2.0000 mg | ORAL_TABLET | ORAL | Status: DC | PRN

## 2014-06-01 NOTE — Discharge Summary (Signed)
Physician Discharge Summary   Patient ID: [name] MRN: [id number] DOB/AGE: Sep 11, 1940 74 y.o.  Admit date: 05/31/2014 Discharge date: 06-01-2014  Primary Diagnosis:  Medial compartment osteoarthritis, Left knee  Admission Diagnoses:  Past Medical History  Diagnosis Date  . Allergic rhinitis   . Breast cancer 1999, 2008    s/p bilat mastectomy  . Pulmonary nodules     no change CT chest 3/10  . GERD (gastroesophageal reflux disease)   . HTN (hypertension)   . Hyperlipidemia   . NSVT (nonsustained ventricular tachycardia)     a. 05/2013 after MI->amio caused nausea->BB only.  . Hypothyroid   . Ischemic cardiomyopathy     a. 05/2013 Echo: EF 40-45%, midinferior AK.  . Takotsubo cardiomyopathy     a. 2013 with subsequent recovery of LV fxn (followed by MI in 05/2013 with LV dysfxn).  Marland Kitchen CAD (coronary artery disease)     a. H/o nonobs dzs and vasospasm with takotsubo CM;  b. 05/2013 Inf STEMI/Cath: LM nl, LAD nl, LCX nondom 100d (failed PCI), RCA dominant, nl, RPDA 46m(262mvessel-->Med Rx), EF 35-40%.  . Coronary vasospasm     some chest pain "last chest pressure episode 12'15 -evaluated at [facility], multiple testing-ruleout MI, "thought anxiety"  . 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 Depression   . Anxiety   . Pulmonary emboli     history of dx. 07-05-13 [facility] Discharge Diagnoses:   Principal Problem:   OA (osteoarthritis) of knee  Estimated body mass index is 26.37 kg/(m^2) as calculated from the following:   Height as of this encounter: _0  (1.651 m).   Weight as of this encounter: 71.867 kg (158 lb 7 oz).  Procedure:  Procedure(s) (LRB): UNICOMPARTMENTAL LEFT KNEE (Left)   Consults: None  HPI: [name] a [age].o. female, who has  significant isolated medial compartment arthritis of the Left knee. She has had nonoperative management including injections. She has had  cortisone and viscous supplements.  Unfortunately, the pain persists.  Radiograph showed isolated medial compartment bone-on-bone arthritis  with normal-appearing patellofemoral and lateral compartments. She  presents now for left knee unicompartmental arthroplasty.   Laboratory Data: Admission on 05/31/2014  Component Date Value Ref Range Status  . ABO/RH(D) 05/31/2014 B POS   Final  . Antibody Screen 05/31/2014 NEG   Final  . Sample Expiration 05/31/2014 06/03/2014   Final  . Prothrombin Time 05/31/2014 14.0  11.6 - 15.2 seconds Final  . INR 05/31/2014 1.07  0.00 - 1.49 Final  . ABO/RH(D) 05/31/2014 B POS   Final  . WBC 06/01/2014 8.6  4.0 - 10.5 K/uL Final  . RBC 06/01/2014 4.27  3.87 - 5.11 MIL/uL Final  . Hemoglobin 06/01/2014 11.0* 12.0 - 15.0 g/dL Final  . HCT 06/01/2014 35.5* 36.0 - 46.0 % Final  . MCV 06/01/2014 83.1  78.0 - 100.0 fL Final  . MCH 06/01/2014 25.8* 26.0 - 34.0 pg Final  . MCHC 06/01/2014 31.0  30.0 - 36.0 g/dL Final  . RDW 06/01/2014 14.9  11.5 - 15.5 % Final  . Platelets 06/01/2014 238  150 - 400 K/uL Final  . Sodium 06/01/2014 138  135 - 145 mmol/L Final  . Potassium 06/01/2014 4.0  3.5 - 5.1 mmol/L Final  . Chloride 06/01/2014 105  96 - 112 mmol/L Final  . CO2 06/01/2014 25  19 - 32 mmol/L Final  . Glucose, Bld 06/01/2014  167* 70 - 99 mg/dL Final  . BUN 06/01/2014 16  6 - 23 mg/dL Final  . Creatinine, Ser 06/01/2014 0.73  0.50 - 1.10 mg/dL Final  . Calcium 06/01/2014 8.7  8.4 - 10.5 mg/dL Final  . GFR calc non Af Amer 06/01/2014 83* >90 mL/min Final  . GFR calc Af Amer 06/01/2014 >90  >90 mL/min Final   Comment: (NOTE) The eGFR has been calculated using the CKD EPI equation. This calculation has not been validated in all clinical situations. eGFR's persistently <90 mL/min signify possible Chronic Kidney Disease.   . Anion gap 06/01/2014 8  5 - 15 Final  . Prothrombin Time 06/01/2014 13.9  11.6 - 15.2 seconds Final  . INR 06/01/2014 1.06  0.00 - 1.49 Final  Hospital Outpatient  Visit on 05/21/2014  Component Date Value Ref Range Status  . aPTT 05/21/2014 53* 24 - 37 seconds Final   Comment:        IF BASELINE aPTT IS ELEVATED, SUGGEST PATIENT RISK ASSESSMENT BE USED TO DETERMINE APPROPRIATE ANTICOAGULANT THERAPY.   . WBC 05/21/2014 4.4  4.0 - 10.5 K/uL Final  . RBC 05/21/2014 4.86  3.87 - 5.11 MIL/uL Final  . Hemoglobin 05/21/2014 12.3  12.0 - 15.0 g/dL Final  . HCT 05/21/2014 40.4  36.0 - 46.0 % Final  . MCV 05/21/2014 83.1  78.0 - 100.0 fL Final  . MCH 05/21/2014 25.3* 26.0 - 34.0 pg Final  . MCHC 05/21/2014 30.4  30.0 - 36.0 g/dL Final  . RDW 05/21/2014 15.2  11.5 - 15.5 % Final  . Platelets 05/21/2014 249  150 - 400 K/uL Final  . Sodium 05/21/2014 140  135 - 145 mmol/L Final  . Potassium 05/21/2014 5.1  3.5 - 5.1 mmol/L Final  . Chloride 05/21/2014 105  96 - 112 mmol/L Final  . CO2 05/21/2014 26  19 - 32 mmol/L Final  . Glucose, Bld 05/21/2014 92  70 - 99 mg/dL Final  . BUN 05/21/2014 27* 6 - 23 mg/dL Final  . Creatinine, Ser 05/21/2014 0.85  0.50 - 1.10 mg/dL Final  . Calcium 05/21/2014 9.7  8.4 - 10.5 mg/dL Final  . Total Protein 05/21/2014 7.5  6.0 - 8.3 g/dL Final  . Albumin 05/21/2014 4.4  3.5 - 5.2 g/dL Final  . AST 05/21/2014 21  0 - 37 U/L Final  . ALT 05/21/2014 16  0 - 35 U/L Final  . Alkaline Phosphatase 05/21/2014 114  39 - 117 U/L Final  . Total Bilirubin 05/21/2014 0.4  0.3 - 1.2 mg/dL Final  . GFR calc non Af Amer 05/21/2014 66* >90 mL/min Final  . GFR calc Af Amer 05/21/2014 77* >90 mL/min Final   Comment: (NOTE) The eGFR has been calculated using the CKD EPI equation. This calculation has not been validated in all clinical situations. eGFR's persistently <90 mL/min signify possible Chronic Kidney Disease.   . Anion gap 05/21/2014 9  5 - 15 Final  . Prothrombin Time 05/21/2014 32.1* 11.6 - 15.2 seconds Final  . INR 05/21/2014 3.09* 0.00 - 1.49 Final  . Color, Urine 05/21/2014 YELLOW  YELLOW Final  . APPearance 05/21/2014 CLEAR   CLEAR Final  . Specific Gravity, Urine 05/21/2014 1.019  1.005 - 1.030 Final  . pH 05/21/2014 5.0  5.0 - 8.0 Final  . Glucose, UA 05/21/2014 NEGATIVE  NEGATIVE mg/dL Final  . Hgb urine dipstick 05/21/2014 NEGATIVE  NEGATIVE Final  . Bilirubin Urine 05/21/2014 NEGATIVE  NEGATIVE Final  . Ketones, ur 05/21/2014  NEGATIVE  NEGATIVE mg/dL Final  . Protein, ur 05/21/2014 NEGATIVE  NEGATIVE mg/dL Final  . Urobilinogen, UA 05/21/2014 0.2  0.0 - 1.0 mg/dL Final  . Nitrite 05/21/2014 NEGATIVE  NEGATIVE Final  . Leukocytes, UA 05/21/2014 NEGATIVE  NEGATIVE Final   MICROSCOPIC NOT DONE ON URINES WITH NEGATIVE PROTEIN, BLOOD, LEUKOCYTES, NITRITE, OR GLUCOSE <1000 mg/dL.  Marland Kitchen MRSA, PCR 05/21/2014 NEGATIVE  NEGATIVE Final  . Staphylococcus aureus 05/21/2014 NEGATIVE  NEGATIVE Final   Comment:        The Xpert SA Assay (FDA approved for NASAL specimens in patients over 47 years of age), is one component of a comprehensive surveillance program.  Test performance has been validated by [facility] for patients greater than or equal to 71 year old. It is not intended to diagnose infection nor to guide or monitor treatment.      X-Rays:No results found.  EKG: Orders placed or performed during the hospital encounter of 05/21/14  . EKG 12-Lead  . EKG 12-Lead     Hospital Course: DONOVAN GATCHEL is a 74 y.o. who was admitted to [facility]. They were brought to the operating room on 05/31/2014 and underwent Procedure(s): UNICOMPARTMENTAL LEFT KNEE.  Patient tolerated the procedure well and was later transferred to the recovery room and then to the orthopaedic floor for postoperative care.  They were given PO and IV analgesics for pain control following their surgery.  They were given 24 hours of postoperative antibiotics of  Anti-infectives    Start     Dose/Rate Route Frequency Ordered Stop   05/31/14 1700  ceFAZolin (ANCEF) IVPB 2 g/50 mL premix     2 g 100 mL/hr over 30 Minutes  Intravenous Every 6 hours 05/31/14 1338 05/31/14 2339   05/31/14 0725  ceFAZolin (ANCEF) IVPB 2 g/50 mL premix     2 g 100 mL/hr over 30 Minutes Intravenous On call to O.R. 05/31/14 0725 05/31/14 1044     and started on DVT prophylaxis in the form of Lovenox and Coumadin.   PT and OT were ordered for total joint protocol.  Discharge planning consulted to help with postop disposition and equipment needs.  Patient had a tough night on the evening of surgery with very little sleep.  They started to get up OOB with therapy on day one. Hemovac drain was pulled without difficulty.  Dressing was changed and the incision looked good and no signs of infection or cellulitits.  Patient was seen in rounds by Dr. Wynelle Link and it was felt that she would be ready to go home following therapy.  Discharge home with home health Diet - Cardiac diet Follow up - in 2 weeks Activity - WBAT to left leg Disposition - Home after therapy Condition Upon Discharge - pending, home if does well with therapy D/C Meds - See DC Summary DVT Prophylaxis - Lovenox and Coumadin, She will need HHRN for blood draws for INR monitoring. INR is 1.06 today.  Discharge Instructions    Call MD / Call 911    Complete by:  As directed   If you experience chest pain or shortness of breath, CALL 911 and be transported to the hospital emergency room.  If you develope a fever above 101 F, pus (white drainage) or increased drainage or redness at the wound, or calf pain, call your surgeon's office.     Change dressing    Complete by:  As directed   Change dressing daily with sterile 4 x 4  inch gauze dressing and apply TED hose. Do not submerge the incision under water.     Constipation Prevention    Complete by:  As directed   Drink plenty of fluids.  Prune juice may be helpful.  You may use a stool softener, such as Colace (over the counter) 100 mg twice a day.  Use MiraLax (over the counter) for constipation as needed.     Diet - low sodium  heart healthy    Complete by:  As directed      Discharge instructions    Complete by:  As directed   Pick up stool softner and laxative for home use following surgery while on pain medications. Do not submerge incision under water. Please use good hand washing techniques while changing dressing each day. May shower starting three days after surgery. Please use a clean towel to pat the incision dry following showers. Continue to use ice for pain and swelling after surgery. Do not use any lotions or creams on the incision until instructed by your surgeon.  Postoperative Constipation Protocol  Constipation - defined medically as fewer than three stools per week and severe constipation as less than one stool per week.  One of the most common issues patients have following surgery is constipation.  Even if you have a regular bowel pattern at home, your normal regimen is likely to be disrupted due to multiple reasons following surgery.  Combination of anesthesia, postoperative narcotics, change in appetite and fluid intake all can affect your bowels.  In order to avoid complications following surgery, here are some recommendations in order to help you during your recovery period.  Colace (docusate) - Pick up an over-the-counter form of Colace or another stool softener and take twice a day as long as you are requiring postoperative pain medications.  Take with a full glass of water daily.  If you experience loose stools or diarrhea, hold the colace until you stool forms back up.  If your symptoms do not get better within 1 week or if they get worse, check with your doctor.  Dulcolax (bisacodyl) - Pick up over-the-counter and take as directed by the product packaging as needed to assist with the movement of your bowels.  Take with a full glass of water.  Use this product as needed if not relieved by Colace only.   MiraLax (polyethylene glycol) - Pick up over-the-counter to have on hand.  MiraLax is a  solution that will increase the amount of water in your bowels to assist with bowel movements.  Take as directed and can mix with a glass of water, juice, soda, coffee, or tea.  Take if you go more than two days without a movement. Do not use MiraLax more than once per day. Call your doctor if you are still constipated or irregular after using this medication for 7 days in a row.  If you continue to have problems with postoperative constipation, please contact the office for further assistance and recommendations.  If you experience "the worst abdominal pain ever" or develop nausea or vomiting, please contact the office immediatly for further recommendations for treatment.  Take Coumadin for three weeks for postoperative protocol and then the patient may resume their previous Coumadin home regimen.  The dose may need to be adjusted based upon the INR.  Please follow the INR and titrate Coumadin dose for a therapeutic range between 2.0 and 3.0 INR.  After completing the three weeks of Coumadin, the patient may resume their  previous Coumadin home regimen.  Continue Lovenox injections until the INR is therapeutic at or greater than 2.0.  When INR reaches the therapeutic level of equal to or greater than 2.0, the patient may discontinue the Lovenox injections.     Do not put a pillow under the knee. Place it under the heel.    Complete by:  As directed      Do not sit on low chairs, stoools or toilet seats, as it may be difficult to get up from low surfaces    Complete by:  As directed      Driving restrictions    Complete by:  As directed   No driving until released by the physician.     Increase activity slowly as tolerated    Complete by:  As directed      Lifting restrictions    Complete by:  As directed   No lifting until released by the physician.     Patient may shower    Complete by:  As directed   You may shower without a dressing once there is no drainage.  Do not wash over the wound.  If  drainage remains, do not shower until drainage stops.     TED hose    Complete by:  As directed   Use stockings (TED hose) for 3 weeks on both leg(s).  You may remove them at night for sleeping.     Weight bearing as tolerated    Complete by:  As directed   Laterality:  left  Extremity:  Lower            Medication List    STOP taking these medications        B-12 TR 1000 MCG Tbcr  Generic drug:  Cyanocobalamin     CALTRATE 600+D 600-400 MG-UNIT per tablet  Generic drug:  Calcium Carbonate-Vitamin D     COSAMIN ASU ADVANCED FORMULA PO     diclofenac sodium 1 % Gel  Commonly known as:  VOLTAREN     Flax Seeds Powd     Vitamin D 1000 UNITS capsule      TAKE these medications        acetaminophen 500 MG tablet  Commonly known as:  TYLENOL  Take 1,000 mg by mouth 2 (two) times daily.     aspirin 81 MG EC tablet  Take 81 mg by mouth at bedtime.     carboxymethylcellulose 0.5 % Soln  Commonly known as:  REFRESH PLUS  Place 2 drops into both eyes 2 (two) times daily as needed (dry eyes.).     carvedilol 6.25 MG tablet  Commonly known as:  COREG  Take 1 tablet (6.25 mg total) by mouth 2 (two) times daily with a meal.     cetirizine 10 MG tablet  Commonly known as:  ZYRTEC  Take 10 mg by mouth every morning.     diazepam 5 MG tablet  Commonly known as:  VALIUM  Take 5 mg by mouth every 12 (twelve) hours as needed for anxiety.     docusate sodium 100 MG capsule  Commonly known as:  COLACE  Take 200 mg by mouth every evening.     enoxaparin 40 MG/0.4ML injection  Commonly known as:  LOVENOX  Inject 0.4 mLs (40 mg total) into the skin daily. Continue Lovenox injections until the INR is therapeutic at or greater than 2.0.  When INR reaches the therapeutic level of equal to or greater than 2.0,  the patient may discontinue the Lovenox injections.     escitalopram 5 MG tablet  Commonly known as:  LEXAPRO  Take 5 mg by mouth every morning.     esomeprazole 40 MG  capsule  Commonly known as:  NEXIUM  Take 40 mg by mouth daily as needed (GERD).     furosemide 20 MG tablet  Commonly known as:  LASIX  Take 1 tablet (20 mg total) by mouth daily.     HYDROmorphone 2 MG tablet  Commonly known as:  DILAUDID  Take 1-2 tablets (2-4 mg total) by mouth every 4 (four) hours as needed for moderate pain or severe pain.     isosorbide mononitrate 30 MG 24 hr tablet  Commonly known as:  IMDUR  Take 0.5 tablets (15 mg total) by mouth daily.     levothyroxine 75 MCG tablet  Commonly known as:  SYNTHROID, LEVOTHROID  Take 1 tablet (75 mcg total) by mouth daily before breakfast.     magnesium oxide 400 MG tablet  Commonly known as:  MAG-OX  Take 400 mg by mouth daily after lunch.     meclizine 25 MG tablet  Commonly known as:  ANTIVERT  Take 25 mg by mouth 2 (two) times daily as needed for dizziness.     methocarbamol 500 MG tablet  Commonly known as:  ROBAXIN  Take 1 tablet (500 mg total) by mouth every 6 (six) hours as needed for muscle spasms.     metoprolol tartrate 25 MG tablet  Commonly known as:  LOPRESSOR  Take 12.5 mg by mouth 2 (two) times daily.     NASONEX 50 MCG/ACT nasal spray  Generic drug:  mometasone  Place 2 sprays into the nose at bedtime.     nitroGLYCERIN 0.4 MG SL tablet  Commonly known as:  NITROSTAT  Place 1 tablet (0.4 mg total) under the tongue every 5 (five) minutes as needed for chest pain. May repeat three times     potassium chloride 10 MEQ tablet  Commonly known as:  K-DUR  Take 1 tablet (10 mEq total) by mouth daily.     pravastatin 20 MG tablet  Commonly known as:  PRAVACHOL  Take 20 mg by mouth at bedtime.     ranitidine 150 MG tablet  Commonly known as:  ZANTAC  Take 150 mg by mouth at bedtime.     sodium chloride 0.65 % Soln nasal spray  Commonly known as:  OCEAN  Place 2 sprays into both nostrils as needed for congestion.     spironolactone 25 MG tablet  Commonly known as:  ALDACTONE  Take 25 mg by  mouth every morning.     traMADol 50 MG tablet  Commonly known as:  ULTRAM  Take 1-2 tablets (50-100 mg total) by mouth every 6 (six) hours as needed (mild pain).     warfarin 3 MG tablet  Commonly known as:  COUMADIN  Take 1.5 tablets (4.5 mg total) by mouth at bedtime. Take Coumadin for three weeks for postoperative protocol and then the patient may resume their previous Coumadin home regimen.  The dose may need to be adjusted based upon the INR.  Please follow the INR and titrate Coumadin dose for a therapeutic range between 2.0 and 3.0 INR.  After completing the three weeks of Coumadin, the patient may resume their previous Coumadin home regimen.           Follow-up Information    Follow up with [name], MD.  Schedule an appointment as soon as possible for a visit on 06/15/2014.   Specialty:  Orthopedic Surgery   Why:  Call office at [phone] to setup appointment with Dr. Wynelle Link in two weeks on 06/15/2014.   Contact information:   [address] [place] [address] [phone]       Signed: Arlee Muslim, PA-C Orthopaedic Surgery 06/01/2014, 9:02 AM

## 2014-06-01 NOTE — Progress Notes (Signed)
Subjective: 1 Day Post-Op Procedure(s) (LRB): UNICOMPARTMENTAL LEFT KNEE (Left) Patient reports pain as mild.   Patient seen in rounds with Dr. Wynelle Link. Had a tough night last night with very little rest.  Better this morning.  She got up out of bed yesterday a little with therapy.  Plan home today after therapy sessions.  May need both sessions prior to discharge. Patient is well, but has had some minor complaints of pain in the knee, requiring pain medications Patient is ready to go home later today if she does well with therapy.  Objective: Vital signs in last 24 hours: Temp:  [97.5 F (36.4 C)-98.2 F (36.8 C)] 98.1 F (36.7 C) (04/12 0547) Pulse Rate:  [55-65] 57 (04/12 0547) Resp:  [9-17] 16 (04/12 0547) BP: (98-133)/(51-71) 119/58 mmHg (04/12 0547) SpO2:  [94 %-100 %] 98 % (04/12 0547)  Intake/Output from previous day:  Intake/Output Summary (Last 24 hours) at 06/01/14 0838 Last data filed at 06/01/14 0645  Gross per 24 hour  Intake   3492 ml  Output   2347 ml  Net   1145 ml    Labs:  Recent Labs  06/01/14 0417  HGB 11.0*    Recent Labs  06/01/14 0417  WBC 8.6  RBC 4.27  HCT 35.5*  PLT 238    Recent Labs  06/01/14 0417  NA 138  K 4.0  CL 105  CO2 25  BUN 16  CREATININE 0.73  GLUCOSE 167*  CALCIUM 8.7    Recent Labs  05/31/14 0740 06/01/14 0417  INR 1.07 1.06    EXAM: General - Patient is Alert, Appropriate and Oriented Extremity - Neurovascular intact Sensation intact distally Dorsiflexion/Plantar flexion intact Incision - clean, dry, no drainage, healing Motor Function - intact, moving foot and toes well on exam.   Assessment/Plan: 1 Day Post-Op Procedure(s) (LRB): UNICOMPARTMENTAL LEFT KNEE (Left) Procedure(s) (LRB): UNICOMPARTMENTAL LEFT KNEE (Left) Past Medical History  Diagnosis Date  . Allergic rhinitis   . Breast cancer 1999, 2008    s/p bilat mastectomy  . Pulmonary nodules     no change CT chest 3/10  . GERD  (gastroesophageal reflux disease)   . HTN (hypertension)   . Hyperlipidemia   . NSVT (nonsustained ventricular tachycardia)     a. 05/2013 after MI->amio caused nausea->BB only.  . Hypothyroid   . Ischemic cardiomyopathy     a. 05/2013 Echo: EF 40-45%, midinferior AK.  . Takotsubo cardiomyopathy     a. 2013 with subsequent recovery of LV fxn (followed by MI in 05/2013 with LV dysfxn).  Marland Kitchen CAD (coronary artery disease)     a. H/o nonobs dzs and vasospasm with takotsubo CM;  b. 05/2013 Inf STEMI/Cath: LM nl, LAD nl, LCX nondom 100d (failed PCI), RCA dominant, nl, RPDA 58m (63mm vessel-->Med Rx), EF 35-40%.  . Coronary vasospasm     some chest pain "last chest pressure episode 12'15 -evaluated at [facility], multiple testing-ruleout MI, "thought anxiety"  . 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 Depression   . Anxiety   . Pulmonary emboli     history of dx. 07-05-13 [facility]   Principal Problem:   OA (osteoarthritis) of knee  Estimated body mass index is 26.37 kg/(m^2) as calculated from the following:   Height as of this encounter: 5\' 5"  (1.651 m).   Weight as of this encounter: 71.867 kg (158 lb 7 oz). Up with therapy Discharge home with home  health Diet - Cardiac diet Follow up - in 2 weeks Activity - WBAT to left leg Disposition - Home after therapy Condition Upon Discharge - pending, home if does well with therapy D/C Meds - See DC Summary DVT Prophylaxis - Lovenox and Coumadin, She will need HHRN for blood draws for INR monitoring. INR is 1.06 today.  Arlee Muslim, PA-C Orthopaedic Surgery 06/01/2014, 8:38 AM

## 2014-06-01 NOTE — Progress Notes (Signed)
Physical Therapy Treatment Patient Details Name: [name] MRN: [id number] DOB: October 07, 1940 Today's Date: 06/01/2014    History of Present Illness L uniknee    PT Comments    Patient tolerated ambulation well and there ex. Plans for Dc to day after PT.  Follow Up Recommendations  Home health PT;Supervision/Assistance - 24 hour     Equipment Recommendations  None recommended by PT    Recommendations for Other Services       Precautions / Restrictions Precautions Precautions: Knee Required Braces or Orthoses: Knee Immobilizer - Left Knee Immobilizer - Left: Discontinue once straight leg raise with < 10 degree lag    Mobility  Bed Mobility   Bed Mobility: Sit to Supine       Sit to supine: Min assist   General bed mobility comments: assist for LLE  Transfers                    Ambulation/Gait Ambulation/Gait assistance: Min assist Ambulation Distance (Feet): 60 Feet Assistive device: Rolling walker (2 wheeled) Gait Pattern/deviations: Step-to pattern;Antalgic         Stairs            Wheelchair Mobility    Modified Rankin (Stroke Patients Only)       Balance                                    Cognition Arousal/Alertness: Awake/alert Behavior During Therapy: WFL for tasks assessed/performed Overall Cognitive Status: Within Functional Limits for tasks assessed                      Exercises Total Joint Exercises Ankle Circles/Pumps: AROM;Left;Both;10 reps Quad Sets: AROM;Left;Both;10 reps Towel Squeeze: AROM;Left;10 reps Short Arc QuadSinclair Ship;Left;10 reps Heel Slides: AAROM;Left;10 reps Hip ABduction/ADduction: AAROM;Left;10 reps Straight Leg Raises: AAROM;Left;10 reps Goniometric ROM: 10-50    General Comments        Pertinent Vitals/Pain Pain Score: 3  Pain Descriptors / Indicators: Aching;Tightness Pain Intervention(s): Limited activity within patient's tolerance;Monitored during  session;Premedicated before session;Ice applied    Home Living Family/patient expects to be discharged to:: Private residence Living Arrangements: Spouse/significant other Available Help at Discharge: Family Type of Home: House Home Access: Stairs to enter Entrance Stairs-Rails: None Home Layout: One level Home Equipment: Environmental consultant - 2 wheels      Prior Function Level of Independence: Independent          PT Goals (current goals can now be found in the care plan section) Progress towards PT goals: Progressing toward goals    Frequency  7X/week    PT Plan Current plan remains appropriate    Co-evaluation             End of Session Equipment Utilized During Treatment: Left knee immobilizer Activity Tolerance: Patient limited by pain Patient left: in bed;with call bell/phone within reach     Time: 3557-3220 PT Time Calculation (min) (ACUTE ONLY): 38 min  Charges:  $Gait Training: 8-22 mins $Therapeutic Exercise: 8-22 mins $Self Care/Home Management: 11/02/2022                    G Codes:      Claretha Cooper 06/01/2014, 1:34 PM

## 2014-06-01 NOTE — Progress Notes (Signed)
ANTICOAGULATION CONSULT NOTE - follow up  Pharmacy Consult for warfarin Indication:VTE Prophylaxis  Allergies  Allergen Reactions  . Amiodarone Nausea And Vomiting  . Codeine Nausea And Vomiting  . Hydrocodone Nausea And Vomiting  . Morphine Nausea And Vomiting  . Prednisone     Keeps her awake  . Statins Other (See Comments)    Leg cramps" Lipitor only"  . Procaine Hcl Palpitations    Patient Measurements: Height: 5\' 5"  (165.1 cm) Weight: 158 lb 7 oz (71.867 kg) IBW/kg (Calculated) : 57   Vital Signs: Temp: 98.1 F (36.7 C) (04/12 0547) Temp Source: Oral (04/12 0547) BP: 119/58 mmHg (04/12 0547) Pulse Rate: 57 (04/12 0547)  Labs:  Recent Labs  05/31/14 0740 06/01/14 0417  HGB  --  11.0*  HCT  --  35.5*  PLT  --  238  LABPROT 14.0 13.9  INR 1.07 1.06  CREATININE  --  0.73    Estimated Creatinine Clearance: 62.3 mL/min (by C-G formula based on Cr of 0.73).   Medical History: Past Medical History  Diagnosis Date  . Allergic rhinitis   . Breast cancer 1999, 2008    s/p bilat mastectomy  . Pulmonary nodules     no change CT chest 3/10  . GERD (gastroesophageal reflux disease)   . HTN (hypertension)   . Hyperlipidemia   . NSVT (nonsustained ventricular tachycardia)     a. 05/2013 after MI->amio caused nausea->BB only.  . Hypothyroid   . Ischemic cardiomyopathy     a. 05/2013 Echo: EF 40-45%, midinferior AK.  . Takotsubo cardiomyopathy     a. 2013 with subsequent recovery of LV fxn (followed by MI in 05/2013 with LV dysfxn).  Marland Kitchen CAD (coronary artery disease)     a. H/o nonobs dzs and vasospasm with takotsubo CM;  b. 05/2013 Inf STEMI/Cath: LM nl, LAD nl, LCX nondom 100d (failed PCI), RCA dominant, nl, RPDA 79m (75mm vessel-->Med Rx), EF 35-40%.  . Coronary vasospasm     some chest pain "last chest pressure episode 12'15 -evaluated at [facility], multiple testing-ruleout MI, "thought anxiety"  . 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 Depression   . Anxiety   . Pulmonary emboli     history of dx. 07-05-13 [facility]    Medications:  Scheduled:  . acetaminophen  1,000 mg Oral 4 times per day  . docusate sodium  100 mg Oral BID  . enoxaparin (LOVENOX) injection  30 mg Subcutaneous Q12H  . escitalopram  5 mg Oral q morning - 10a  . esomeprazole  20 mg Oral BH-q7a  . famotidine  20 mg Oral QHS  . fluticasone  2 spray Each Nare QHS  . levothyroxine  75 mcg Oral QAC breakfast  . loratadine  10 mg Oral Daily  . metoprolol tartrate  12.5 mg Oral BID  . spironolactone  25 mg Oral q morning - 10a  . Warfarin - Pharmacist Dosing Inpatient   Does not apply q1800    Assessment: 74 y.o female with hx of DVT/PE, here for left knee uni replacement.  Pharmacy consulted to dose warfarin.  Home dose warfarin 4.5mg  daily, LD 4/5 Enoxaparin bridge, 40mg  daily, LD 4/10  Today 4/12: INR 1.06 No significant drug interaction noted  Goal of Therapy:  INR 2-3   Plan:  Warfarin 4.5mg  x1 @ 1800 Daily INR/PT Follow for signs of bleeding Continue enoxaparin til INR >2, then may d/c  Dolly Rias  RPh 06/01/2014, 10:32 AM Pager [phone]

## 2014-06-01 NOTE — Evaluation (Signed)
Occupational Therapy Evaluation Patient Details Name: [name] MRN: [id number] DOB: 1940-04-17 Today's Date: 06/01/2014    History of Present Illness L uniknee   Clinical Impression   This 74 year old female was admitted for the above surgery.  All education was completed.  No further OT is needed at this time.  Husband came near end of session.  Reviewed education with him.    Follow Up Recommendations  No OT follow up;Supervision/Assistance - 24 hour    Equipment Recommendations  None recommended by OT    Recommendations for Other Services       Precautions / Restrictions Precautions Precautions: Knee Required Braces or Orthoses: Knee Immobilizer - Left Knee Immobilizer - Left: Discontinue once straight leg raise with < 10 degree lag Restrictions Weight Bearing Restrictions: No      Mobility Bed Mobility         Supine to sit: Min assist     General bed mobility comments: assist for LLE  Transfers   Equipment used: Rolling walker (2 wheeled) Transfers: Sit to/from Stand Sit to Stand: Min guard;Min assist         General transfer comment: min A from bed; min guard from commode with bar. Cues for safety    Balance                                            ADL Overall ADL's : Needs assistance/impaired     Grooming: Oral care;Supervision/safety;Standing   Upper Body Bathing: Set up;Sitting   Lower Body Bathing: Minimal assistance;Sit to/from stand   Upper Body Dressing : Set up;Sitting   Lower Body Dressing: Minimal assistance;Sit to/from stand   Toilet Transfer: Minimal assistance;Ambulation;Comfort height toilet;Grab bars   Toileting- Clothing Manipulation and Hygiene: Min guard;Sit to/from stand         General ADL Comments: discussed tub readiness.  Pt will sponge bathe initially.  She has an angled grab bar by her comfort height commode:  can perform transfer with min A.  Husband will assist as needed with ADLs.   Cues for walker safety when ambulating to bathroom--walker distance, turns, and sidestepping     Vision     Perception     Praxis      Pertinent Vitals/Pain Pain Score: 3  (L knee) Pain Descriptors / Indicators: Aching Pain Intervention(s): Limited activity within patient's tolerance;Monitored during session;Premedicated before session;Repositioned;Ice applied     Hand Dominance     Extremity/Trunk Assessment Upper Extremity Assessment Upper Extremity Assessment: Overall WFL for tasks assessed           Communication Communication Communication: No difficulties   Cognition Arousal/Alertness: Awake/alert Behavior During Therapy: WFL for tasks assessed/performed Overall Cognitive Status: Within Functional Limits for tasks assessed                     General Comments       Exercises       Shoulder Instructions      Home Living Family/patient expects to be discharged to:: Private residence Living Arrangements: Spouse/significant other                 Bathroom Shower/Tub: Tub/shower unit Shower/tub characteristics: Curtain Biochemist, clinical: Handicapped height                Prior Functioning/Environment Level of Independence: Independent  OT Diagnosis: Generalized weakness;Acute pain   OT Problem List:     OT Treatment/Interventions:      OT Goals(Current goals can be found in the care plan section) Acute Rehab OT Goals Patient Stated Goal: to go home  OT Frequency:     Barriers to D/C:            Co-evaluation              End of Session    Activity Tolerance: Patient tolerated treatment well Patient left: in chair;with call bell/phone within reach;with family/visitor present   Time: 6606-3016 OT Time Calculation (min): 35 min Charges:  OT General Charges $OT Visit: 1 Procedure OT Evaluation $Initial OT Evaluation Tier I: 1 Procedure OT Treatments $Self Care/Home Management : 8-22 mins G-Codes:     Tuwanda Vokes 15-Jun-2014, 9:01 AM   Lesle Chris, OTR/L [phone] June 15, 2014

## 2014-06-01 NOTE — Care Management Note (Signed)
    Page 1 of 1   06/01/2014     1:49:29 PM CARE MANAGEMENT NOTE 06/01/2014  Patient:  [name], [name]   Account Number:  [id number]  Date Initiated:  06/01/2014  Documentation initiated by:  [facility]  Subjective/Objective Assessment:   adm: Medial compartment osteoarthritis, Left knee     Action/Plan:   discharge planning   Anticipated DC Date:  06/01/2014   Anticipated DC Plan:  [place]  CM consult      [facility] Choice  HOME HEALTH   Choice offered to / List presented to:  C-1 Patient        [place] arranged  HH-1 RN  HH-2 PT      Status of service:  Completed, signed off Medicare Important Message given?   (If response is "NO", the following Medicare IM given date fields will be blank) Date Medicare IM given:   Medicare IM given by:   Date Additional Medicare IM given:   Additional Medicare IM given by:    Discharge Disposition:  [place]  Per UR Regulation:    If discussed at Long Length of Stay Meetings, dates discussed:    Comments:  06/01/14 08:30 CM met with pt in room to offer choice of home health agency. Pt choses [facility] to render HHPT/RN (for INR).  Address and contact information verified with Pt.  No DME is needed. Referral emailed to [place], with request for Sonia Side as therapist (per pt request).  No other Cm needs were communicated.  Tempie Hoist, BSN, [place].

## 2014-06-01 NOTE — Discharge Instructions (Addendum)
Dr. Gaynelle Arabian Total Joint Specialist [facility] - Closed [address] [place], [place] [address] [phone]  UNI KNEE REPLACEMENT POSTOPERATIVE DIRECTIONS   Knee Rehabilitation, Guidelines Following Surgery  Results after knee surgery are often greatly improved when you follow the exercise, range of motion and muscle strengthening exercises prescribed by your doctor. Safety measures are also important to protect the knee from further injury. Any time any of these exercises cause you to have increased pain or swelling in your knee joint, decrease the amount until you are comfortable again and slowly increase them. If you have problems or questions, call your caregiver or physical therapist for advice.   HOME CARE INSTRUCTIONS  Remove items at home which could result in a fall. This includes throw rugs or furniture in walking pathways.   ICE to the affected knee every three hours for 30 minutes at a time and then as needed for pain and swelling.  Continue to use ice on the knee for pain and swelling from surgery. You may notice swelling that will progress down to the foot and ankle.  This is normal after surgery.  Elevate the leg when you are not up walking on it.    Continue to use the breathing machine which will help keep your temperature down.  It is common for your temperature to cycle up and down following surgery, especially at night when you are not up moving around and exerting yourself.  The breathing machine keeps your lungs expanded and your temperature down.  Do not place pillow under knee, focus on keeping the knee straight while resting  DIET You may resume your previous home diet once your are discharged from the hospital.  DRESSING / WOUND CARE / SHOWERING You may change your dressing 3-5 days after surgery.  Then change the dressing every day with sterile gauze.  Please use good hand washing techniques before changing the dressing.  Do not use any  lotions or creams on the incision until instructed by your surgeon. You may start showering once you are discharged home but do not submerge the incision under water. Just pat the incision dry and apply a dry gauze dressing on daily. Change the surgical dressing daily and reapply a dry dressing each time.  ACTIVITY Walk with your walker as instructed. Use walker as long as suggested by your caregivers. Avoid periods of inactivity such as sitting longer than an hour when not asleep. This helps prevent blood clots.  You may resume a sexual relationship in one month or when given the OK by your doctor.  You may return to work once you are cleared by your doctor.  Do not drive a car for 6 weeks or until released by you surgeon.  Do not drive while taking narcotics.  WEIGHT BEARING Weight bearing as tolerated with assist device (walker, cane, etc) as directed, use it as long as suggested by your surgeon or therapist, typically at least 4-6 weeks.  POSTOPERATIVE CONSTIPATION PROTOCOL Constipation - defined medically as fewer than three stools per week and severe constipation as less than one stool per week.  One of the most common issues patients have following surgery is constipation.  Even if you have a regular bowel pattern at home, your normal regimen is likely to be disrupted due to multiple reasons following surgery.  Combination of anesthesia, postoperative narcotics, change in appetite and fluid intake all can affect your bowels.  In order to avoid complications following surgery, here are some  recommendations in order to help you during your recovery period.  Colace (docusate) - Pick up an over-the-counter form of Colace or another stool softener and take twice a day as long as you are requiring postoperative pain medications.  Take with a full glass of water daily.  If you experience loose stools or diarrhea, hold the colace until you stool forms back up.  If your symptoms do not get better  within 1 week or if they get worse, check with your doctor.  Dulcolax (bisacodyl) - Pick up over-the-counter and take as directed by the product packaging as needed to assist with the movement of your bowels.  Take with a full glass of water.  Use this product as needed if not relieved by Colace only.   MiraLax (polyethylene glycol) - Pick up over-the-counter to have on hand.  MiraLax is a solution that will increase the amount of water in your bowels to assist with bowel movements.  Take as directed and can mix with a glass of water, juice, soda, coffee, or tea.  Take if you go more than two days without a movement. Do not use MiraLax more than once per day. Call your doctor if you are still constipated or irregular after using this medication for 7 days in a row.  If you continue to have problems with postoperative constipation, please contact the office for further assistance and recommendations.  If you experience "the worst abdominal pain ever" or develop nausea or vomiting, please contact the office immediatly for further recommendations for treatment.  ITCHING  If you experience itching with your medications, try taking only a single pain pill, or even half a pain pill at a time.  You can also use Benadryl over the counter for itching or also to help with sleep.   TED HOSE STOCKINGS Wear the elastic stockings on both legs for three weeks following surgery during the day but you may remove then at night for sleeping.  MEDICATIONS See your medication summary on the After Visit Summary that the nursing staff will review with you prior to discharge.  You may have some home medications which will be placed on hold until you complete the course of blood thinner medication.  It is important for you to complete the blood thinner medication as prescribed by your surgeon.  Continue your approved medications as instructed at time of discharge.  PRECAUTIONS If you experience chest pain or shortness  of breath - call 911 immediately for transfer to the hospital emergency department.  If you develop a fever greater that 101 F, purulent drainage from wound, increased redness or drainage from wound, foul odor from the wound/dressing, or calf pain - CONTACT YOUR SURGEON.                                                   FOLLOW-UP APPOINTMENTS Make sure you keep all of your appointments after your operation with your surgeon and caregivers. You should call the office at the above phone number and make an appointment for approximately two weeks after the date of your surgery or on the date instructed by your surgeon outlined in the "After Visit Summary".  RANGE OF MOTION AND STRENGTHENING EXERCISES  Rehabilitation of the knee is important following a knee injury or an operation. After just a few days of immobilization, the muscles  of the thigh which control the knee become weakened and shrink (atrophy). Knee exercises are designed to build up the tone and strength of the thigh muscles and to improve knee motion. Often times heat used for twenty to thirty minutes before working out will loosen up your tissues and help with improving the range of motion but do not use heat for the first two weeks following surgery. These exercises can be done on a training (exercise) mat, on the floor, on a table or on a bed. Use what ever works the best and is most comfortable for you Knee exercises include:  Leg Lifts - While your knee is still immobilized in a splint or cast, you can do straight leg raises. Lift the leg to 60 degrees, hold for 3 sec, and slowly lower the leg. Repeat 10-20 times 2-3 times daily. Perform this exercise against resistance later as your knee gets better.  Quad and Hamstring Sets - Tighten up the muscle on the front of the thigh (Quad) and hold for 5-10 sec. Repeat this 10-20 times hourly. Hamstring sets are done by pushing the foot backward against an object and holding for 5-10 sec. Repeat as  with quad sets.   Leg Slides: Lying on your back, slowly slide your foot toward your buttocks, bending your knee up off the floor (only go as far as is comfortable). Then slowly slide your foot back down until your leg is flat on the floor again.  Angel Wings: Lying on your back spread your legs to the side as far apart as you can without causing discomfort.  A rehabilitation program following serious knee injuries can speed recovery and prevent re-injury in the future due to weakened muscles. Contact your doctor or a physical therapist for more information on knee rehabilitation.   IF YOU ARE TRANSFERRED TO A SKILLED REHAB FACILITY If the patient is transferred to a skilled rehab facility following release from the hospital, a list of the current medications will be sent to the facility for the patient to continue.  When discharged from the skilled rehab facility, please have the facility set up the patient's [place] prior to being released. Also, the skilled facility will be responsible for providing the patient with their medications at time of release from the facility to include their pain medication, the muscle relaxants, and their blood thinner medication. If the patient is still at the rehab facility at time of the two week follow up appointment, the skilled rehab facility will also need to assist the patient in arranging follow up appointment in our office and any transportation needs.  MAKE SURE YOU:  Understand these instructions.  Get help right away if you are not doing well or get worse.    Pick up stool softner and laxative for home use following surgery while on pain medications. Do not submerge incision under water. Please use good hand washing techniques while changing dressing each day. May shower starting three days after surgery on Thursday 06/03/2014 Please use a clean towel to pat the incision dry following showers. Continue to use ice for pain and  swelling after surgery. Do not use any lotions or creams on the incision until instructed by your surgeon.  Take Coumadin for three weeks for postoperative protocol and then the patient may resume their previous Coumadin home regimen.  The dose may need to be adjusted based upon the INR.  Please follow the INR and titrate Coumadin dose for a therapeutic range  between 2.0 and 3.0 INR.  After completing the three weeks of Coumadin, the patient may resume their previous Coumadin home regimen.  Continue Lovenox injections until the INR is therapeutic at or greater than 2.0.  When INR reaches the therapeutic level of equal to or greater than 2.0, the patient may discontinue the Lovenox injections.

## 2014-06-01 NOTE — Progress Notes (Signed)
   06/01/14 0800  OT Time Calculation  OT Start Time (ACUTE ONLY) 0813  OT Stop Time (ACUTE ONLY) 0848  OT Time Calculation (min) 35 min  OT G-codes **NOT FOR INPATIENT CLASS**  Functional Assessment Tool Used clinical observation and judgment  Functional Limitation Self care  Self Care Current Status (V5643) CJ  Self Care Goal Status (P2951) CJ  Self Care Discharge Status (O8416) CJ  OT General Charges  $OT Visit 1 Procedure  OT Evaluation  $Initial OT Evaluation Tier I 1 Procedure  OT Treatments  $Self Care/Home Management  8-22 mins  Lesle Chris, OTR/L [phone] 06/01/2014

## 2014-06-01 NOTE — Progress Notes (Signed)
Physical Therapy Treatment Patient Details Name: [name] MRN: [id number] DOB: 11/01/1940 Today's Date: 06/01/2014    History of Present Illness L uniknee    PT Comments    Patient is  Improving and ready for Dc.  Follow Up Recommendations  Home health PT;Supervision/Assistance - 24 hour     Equipment Recommendations  None recommended by PT    Recommendations for Other Services       Precautions / Restrictions Precautions Precautions: Knee Required Braces or Orthoses: Knee Immobilizer - Left Knee Immobilizer - Left: Discontinue once straight leg raise with < 10 degree lag    Mobility  Bed Mobility   Bed Mobility: Supine to Sit     Supine to sit: Min guard Sit to supine: Min assist   General bed mobility comments: assist for LLE  Transfers Overall transfer level: Needs assistance Equipment used: Rolling walker (2 wheeled) Transfers: Sit to/from Stand Sit to Stand: Min guard         General transfer comment: cues for  ahnd and L leg position from bed and from [facility]  Ambulation/Gait Ambulation/Gait assistance: Min guard Ambulation Distance (Feet): 80 Feet Assistive device: Rolling walker (2 wheeled) Gait Pattern/deviations: Step-to pattern;Antalgic     General Gait Details: cues for sequence,   Stairs Stairs: Yes Stairs assistance: Min assist Stair Management: No rails;Forwards;With walker Number of Stairs: 1 General stair comments: cues for technique  Wheelchair Mobility    Modified Rankin (Stroke Patients Only)       Balance                                    Cognition Arousal/Alertness: Awake/alert Behavior During Therapy: WFL for tasks assessed/performed Overall Cognitive Status: Within Functional Limits for tasks assessed                      Exercises Total Joint Exercises Ankle Circles/Pumps: AROM;Left;Both;10 reps Quad Sets: AROM;Left;Both;10 reps Towel Squeeze: AROM;Left;10 reps Short Arc QuadSinclair Ship;Left;10 reps Heel Slides: AAROM;Left;10 reps Hip ABduction/ADduction: AAROM;Left;10 reps Straight Leg Raises: AAROM;Left;10 reps Goniometric ROM: 10-50    General Comments        Pertinent Vitals/Pain Pain Score: 3  Pain Location: L knee Pain Descriptors / Indicators: Aching;Tightness Pain Intervention(s): Limited activity within patient's tolerance;Monitored during session;Premedicated before session;Ice applied    Home Living Family/patient expects to be discharged to:: Private residence Living Arrangements: Spouse/significant other Available Help at Discharge: Family Type of Home: House Home Access: Stairs to enter Entrance Stairs-Rails: None Home Layout: One level Home Equipment: Environmental consultant - 2 wheels      Prior Function Level of Independence: Independent          PT Goals (current goals can now be found in the care plan section) Progress towards PT goals: Progressing toward goals    Frequency  7X/week    PT Plan Current plan remains appropriate    Co-evaluation             End of Session Equipment Utilized During Treatment: Left knee immobilizer Activity Tolerance: Patient tolerated treatment well Patient left: in chair;with call bell/phone within reach     Time: 1335-1419 PT Time Calculation (min) (ACUTE ONLY): 44 min  Charges:  $Gait Training: 8-22 mins $Therapeutic Exercise: 8-22 mins $Self Care/Home Management: 8-22                    G  Codes:      Claretha Cooper 06/01/2014, 4:02 PM

## 2014-06-02 NOTE — Progress Notes (Signed)
   05/31/14 1912  PT G-Codes **NOT FOR INPATIENT CLASS**  Functional Assessment Tool Used (clinical judgement)  Functional Limitation Mobility: Walking and moving around  Mobility: Walking and Moving Around Current Status (M0768) CJ  Mobility: Walking and Moving Around Goal Status [phone]) CI  Tresa Endo PT [phone]

## 2014-06-12 NOTE — Consult Note (Signed)
General Aspect 74 y/o female with a h/o CAD s/p recent inferolateral STEMI (med rx) and newly dx ICM, who was admitted yesterday 2/2 dyspnea.   Present Illness 74 y/o female with a prior h/o Takotsubo CM (with subsequent normalization of LV fxn), coronary vasospasm, HTN, HL, hypothyroidism, GERD, breast CA (s/p bilat mastectomies), pulmonary nodules, and allergic rhinitis.  In early April, she was admitted to [facility] with acute inferolateral STEMI.  Cath showed a total occlusion of the distal LCX with moderate dzs in a small (105m) distal RPDA.  The LCX was felt to be the culprit and attempts were made @ PCI, however were unsuccessful and ultimately the case was aborted and medical rx instituted.  EF was 40-45% by echo. Post-cath, she had significant NSVT and was placed on amio.  This was later d/c'd 2/2 ongoing nausea and BB was titrated.  She was not placed on ACEI 2/2 relative hypotn.  She was seen in f/u on 4/20 and reported intermittent dizziness with low'ish BP's and so coreg was reduced to 6.25 bid.  OTW, she had been doing reasonably well, walking 15-18 mins twice daily w/o c/p or significant dyspnea, but occasionaly will have to slow her pace or briefly rest. Her wt has been steady @ 148 lbs. She watches her sodium intake closely but admits to obtaining many meals from local establishments.  About 2 wks ago, she began to note a nagging cough, usually in the morning w/o significant sputum.  She denies fevers/chills.  Yesterday, she had reached into the washer to pull out clothes and after doing so, felt markedly dyspneic w/o chest pain.  This persisted throughout the day and she also noted 2 pillow orthopnea whle lying in bed.  This prompted her to present to the ED.    There, BNP was elevated @ 2508. Trop was nl.  Labs otw unrevealing.  CXR w/o edema. She was placed on O2 via [place] and given lasix 232mIV in the ED with some improvement in Ss.  We have been asked to eval.  She says that overall she is  feeling better but that she was still mildly dyspneic when she just got up to use the toilet.  She denies any recent dizziness/syncope, edema, palpitations, c/p, increasing abd girth, or early satiety.   Physical Exam:  GEN well developed, pleasant, nad.   HEENT pink conjunctivae, moist oral mucosa, normal.   NECK supple  JVP ~ 10cm.  No bruits.   RESP normal resp effort  clear BS   CARD Regular rate and rhythm  Normal, S1, S2  No murmur   ABD denies tenderness  soft  normal BS   LYMPH negative neck   EXTR negative edema   SKIN normal to palpation   NEURO follows commands, motor/sensory function intact, grossly intact, nonfocal.   PSYCH alert   Review of Systems:  Subjective/Chief Complaint Cough, SOB with exertion   General: No Complaints   Skin: No Complaints   ENT: No Complaints   Eyes: No Complaints   Neck: No Complaints   Respiratory: Frequent cough  Short of breath  minimal clear sputum.   Cardiovascular: Dyspnea  Orthopnea   Gastrointestinal: No Complaints   Genitourinary: No Complaints   Vascular: No Complaints   Musculoskeletal: No Complaints   Neurologic: No Complaints   Hematologic: No Complaints   Endocrine: No Complaints   Psychiatric: No Complaints   Review of Systems: All other systems were reviewed and found to be negative  Medications/Allergies Reviewed Medications/Allergies reviewed   Family & Social History:  Family and Social History:  Family History Coronary Artery Disease  2 brothers s/p CABG. Another brother w/ leukemia.  Mother died of CVA.   Social History negative tobacco, negative ETOH, negative Illicit drugs   Place of Living Home  Lives in [place] with her husband.  She is a retired Education officer, museum.       H/O Pulmonary Nodules:    GERD:    Allergic Rhinitis:    NSVT: a. 05/2013 in setting of MI, did not tolerate amiodarone (nausea), on coreg.   Ischemic Cardiomyopathy/Chronic Systolic CHF: a. 10/3265  Echo: EF 40-45%, mid-inferior Akinesis.   Hyperlipidemia:    Relative hypotension w/ h/o HTN:    H/O Takotsubo Cardiomyopathy:    Coronary Vasospasm:    CAD: a. 05/2013 InfLat STEMI/Cath: LM nl, LAD nl, LCX nondominant, 100d (unsuccessful PCI), RCA dominant, nl, RPDA 84m(266mvessel-->Med Rx), EF 35-40%.   Breast Cancer: a. S/P bilateral mastectomies.   MI - Myocardial Infarct:    Hypothyroidism:    Cancer, Breast:    R breast masectomy:    Mastectomy, left:   Home Medications:  Medication Instructions Status  furosemide 20 mg oral tablet 1 tab(s) orally once a day Active  Nasonex 50 microgram(s) nasal once a day (at bedtime) Active  isosorbide mononitrate 30 mg oral tablet, extended release 0.5 tab(s) orally once a day (at bedtime) Active  Aspir 81 81 mg oral delayed release tablet 1 tab(s) orally once a day Active  Caltrate 600 + D 600 mg-800 intl units oral tablet 1 tab(s) orally 2 times a day Active  NexIUM 40 mg oral delayed release capsule 1 cap(s) orally once a day Active  Levo-T 75 microgram(s) orally once a day Active  carvedilol 12.5 mg oral tablet 0.5 tab(s) orally 2 times a day Active  fexofenadine 180 mg oral tablet 1 tab(s) orally once a day Active  B-12 1000 mcg oral tablet 1 tab(s) orally once a day Active  ranitidine 150 mg oral tablet 1 tab(s) orally once a day Active  magnesium hydroxide 400 mg oral tablet, chewable  orally once a day Active  Vitamin D3 1000 intl units oral capsule 1 cap(s) orally once a day Active  docusate sodium sodium 100 mg oral capsule 2 tab(s) orally once a day (at bedtime) Active  potassium chloride 10 mEq oral capsule, extended release  orally once a day Active  pravastatin 20 mg oral tablet 1 tab(s) orally once a day (at bedtime) Active   Lab Results:  Routine Chem:  15-May-15 06:14   B[facility]ri[facility]tic Peptide [facility]8 (Result(s) reported on 03 Jul 2013 at 06:46AM.)  Glucose, Serum  113  BUN 10  Creatinine (comp) 0.86   Sodium, Serum 141  Potassium, Serum 3.8  Chloride, Serum  109  CO2, Serum 25  Calcium (Total), Serum 9.2  Anion Gap 7  Osmolality (calc) 281  eGFR (African American) >60  eGFR (Non-African American) >60 (eGFR values <6044min/1.73 m2 may be an indication of chronic kidney disease (CKD). Calculated eGFR is useful in patients with stable renal function. The eGFR calculation will not be reliable in acutely ill patients when serum creatinine is changing rapidly. It is not useful in  patients on dialysis. The eGFR calculation may not be applicable to patients at the low and high extremes of body sizes, pregnant women, and vegetarians.)  Cardiac:  15-May-15 06:14   Troponin I < 0.02 (0.00-0.05 0.05 ng/mL  or less: NEGATIVE  Repeat testing in 3-6 hrs  if clinically indicated. >0.05 ng/mL: POTENTIAL  MYOCARDIAL INJURY. Repeat  testing in 3-6 hrs if  clinically indicated. NOTE: An increase or decrease  of 30% or more on serial  testing suggests a  clinically important change)    10:33   CPK-MB, Serum  < 0.5 (Result(s) reported on 03 Jul 2013 at 11:12AM.)    14:00   Troponin I < 0.02 (0.00-0.05 0.05 ng/mL or less: NEGATIVE  Repeat testing in 3-6 hrs  if clinically indicated. >0.05 ng/mL: POTENTIAL  MYOCARDIAL INJURY. Repeat  testing in 3-6 hrs if  clinically indicated. NOTE: An increase or decrease  of 30% or more on serial  testing suggests a  clinically important change)  CPK-MB, Serum  < 0.5 (Result(s) reported on 03 Jul 2013 at 02:42PM.)  Routine Hem:  15-May-15 06:14   WBC (CBC) 6.6  RBC (CBC) 4.57  Hemoglobin (CBC)  11.5  Hematocrit (CBC) 36.0  Platelet Count (CBC) 210 (Result(s) reported on 03 Jul 2013 at 06:30AM.)  MCV  79  MCH  25.0  MCHC  31.8  RDW  15.6   EKG:  EKG Interp. by me   Interpretation EKG shows RSR, 79, inf infarct, poor r prog, lat TWI - unchanged since last admission @ [facility].   Radiology Results: XRay:    15-May-15 05:59, Chest Portable  Single View  Chest Portable Single View   REASON FOR EXAM:    shob  COMMENTS:       PROCEDURE: DXR - DXR PORTABLE CHEST SINGLE VIEW  - Jul 03 2013  5:59AM     CLINICAL DATA:  Shortness of breath.  Previous heart attacks.    EXAM:  PORTABLE CHEST - 1 VIEW    COMPARISON:  None.    FINDINGS:  Mild hyperinflation. Normal heart size and pulmonary vascularity. No  focal airspace disease or consolidation in the lungs. Visualized  bones appear intact. Surgical clips in the axilla.     IMPRESSION:  Probable emphysematous changes. No evidence of active pulmonary  disease.      Electronically Signed    By: Lucienne Capers M.D.    On: 07/03/2013 06:04         Verified By: Neale Burly, M.D.,    Morphine: N/V, Other  Novocain: Other  Codeine: Unknown  Vicodin: Unknown  Prednisone: Insomnia  amiodarone: N/V/Diarrhea  Procaine Hydrochloride: N/V/Diarrhea  Vital Signs/Nurse's Notes: **Vital Signs.:   15-May-15 09:04  Vital Signs Type Admission  Temperature Temperature (F) 98.4  Celsius 36.8  Temperature Source oral  Pulse Pulse 71  Respirations Respirations 18  Systolic BP Systolic BP 974  Diastolic BP (mmHg) Diastolic BP (mmHg) 80  Mean BP 93  Pulse Ox % Pulse Ox % 98  Pulse Ox Activity Level  At rest  Oxygen Delivery Room Air/ 21 %  *Intake and Output.:   Shift 15-May-15 15:00  Grand Totals Intake:  480 Output:      Net:  480 24 Hr.:  480  Oral Intake      In:  480  Length of Stay Totals Intake:  480 Output:      Net:  480    Impression 1.  Acute on chronic systolic CHF/ICM:   Pt reports that weight has been stable @ 148 lbs @ home and she has been compliant with meds and careful with her sodium intake @ home.  She developed acute dyspnea yesterday and was felt to have  mild volume overload in the ED with elevated BNP. CXR does not show edema.  At this point, her volume looks pretty good and she has had some symtpomatic improvement.   ---Cont low dose IV  lasix today with a plan to switch her to PO tomorrow.  She likely does not have much volume to come off.   ---Cont BB but reduce to 6.25 bid, as this is what she's been taking @ home in the setting of borderline hypotn and dizziness.  No acei/arb 2/2 relative hypotn.  2.  CAD:   s/p inferolateral STEMI 05/2013.  She has a known occlusion of the distal LCX and severe dzs in a small, distal PDA (37m vessel). She did not have c/p yesterday, but certainly must question possible role of ischemia in causing her Ss.  Trop nl up to this point. ECG non-acute.  Cont asa, bb, nitrate, and statin.  If BP stable, try and titrate nitrate further.   ---[facility] but there is certainly an indication given recent ACS.    3.  Relative Hypotn:   Stable.  Reduce coreg to 6.227mbid as this is what she was on @ home.  4.  HL:   On pravastatin. F/U lipids/lft's.  5.  NSVT:   Follow on tele. Cont bb . 6.  Hypothyroidism:   Synthroid dose reduced last month when TSH was 0.271.  F/U TSH.   Electronic Signatures: BeRogelia MireNP)  (Signed 15-May-15 12:32)  Authored: General Aspect/Present Illness, History and Physical Exam, Review of System, Family & Social History, Past Medical History, Home Medications, Labs, EKG , Radiology, Allergies, Vital Signs/Nurse's Notes, Impression/Plan GoIda RogueMD)  (Signed 15-May-15 15:48)  Authored: General Aspect/Present Illness, History and Physical Exam, Review of System, Family & Social History, Past Medical History, Home Medications, Labs, EKG , Impression/Plan  Co-Signer: General Aspect/Present Illness, History and Physical Exam, Review of System, Family & Social History, Past Medical History, Home Medications, Labs, EKG , Radiology, Allergies, Vital Signs/Nurse's Notes, Impression/Plan   Last Updated: 15-May-15 15:48 by GoIda RogueMD)

## 2014-06-12 NOTE — H&P (Signed)
PATIENT NAME:  [name], [name] MR#:  [id number] DATE OF BIRTH:  October 06, 1940  DATE OF ADMISSION:  07/03/2013  REFERRING PHYSICIAN: Gretchen Short. Beather Arbour, MD  PRIMARY CARE PHYSICIAN: Rusty Aus, MD  PRIMARY CARDIOLOGIST: Minna Merritts, MD  CHIEF COMPLAINT: Shortness of breath.   HISTORY OF PRESENT ILLNESS: This is a 74 year old female with known past medical history of coronary artery disease, hypertension and hyperlipidemia. The patient was recently discharged from [facility] status post ST-elevated MI, where she had cardiac catheterization done, where she had no stent put as the vessel was small and intervention would have been difficult. The patient developed nonsustained ventricular tachycardia during her hospitalization at [facility], where she was put on amiodarone drip then, which resolved with no recurrence after that. The patient has been discharged home. Reports she has been compliant with her medication. Reports she has been weighing herself on a daily basis, where there is no increase in her weight. As well, she reports she has been taking 20 mg of Lasix since her discharge. The patient reports she has been feeling short of breath for the last few days, but reports it has been more significant over the last 24 hours. Reports yesterday she slept on 2 pillows, and this is what prompted her to come to the ED. As well, upon further questioning, the patient reports she has been feeling occasional, very minimal chest tightness upon exertion as well, but not accompanying the last episode as well. The patient's troponin was negative. The patient's EKG showed flipped T wave in the lateral leads.   PAST MEDICAL HISTORY:  1. Hypothyroidism.  2. History of breast cancer.  3. Restless leg syndrome.   4. Hypertension.  5. Ischemic cardiomyopathy.  6. Hyperlipidemia.  7. GERD.   PAST SURGICAL HISTORY:  1. Goiter, partial thyroidectomy.  2. T and A. 3. Lumpectomy.   4. Modified radical left  mastectomy.  5. Right mastectomy.   ALLERGIES:  1. HYDROCODONE.  2. MORPHINE. 3. NOVOCAINE.  4. CODEINE.   SOCIAL HISTORY: Married. In the past, worked in [employer]. No smoking. No alcohol use.   FAMILY HISTORY: No family history of coronary artery disease at young age.   HOME MEDICATIONS:  1. Tylenol as needed.  2. Aspirin 81 mg daily. 3. B12 1000 mcg daily.  4. Calcium with vitamin D 1 tablet 2 times a day.  5. Coreg 12.5 mg oral 2 times a day.  6. Diazepam 5 mg oral every 12 hours as needed for anxiety.  7. Docusate sodium 100 mg oral 2 capsules at bedtime.  8. Omeprazole 40 mg oral daily.  9. Allegra 180 mg oral daily.  10. Flaxseed 2 tablespoons daily.  11. Lasix 20 mg daily.  12. Imdur 30 mg daily.  13. Levothyroxine 75 mcg daily.  14. Magnesium oxide 400 mg oral daily.  15. Meclizine 25 mg daily.  16. SL nitro 0.4 mg sublingual as needed.  17. Potassium 10 mEq oral daily.  18. Ranitidine 150 mg oral every evening.  19. Pravastatin 20 mg at bedtime.  20. Trazodone 50 mg at bedtime.  21. Vitamin D 1000 units 1 tablet daily.   REVIEW OF SYSTEMS:  CONSTITUTIONAL: Denies fever, chills, fatigue, weakness, weight gain, weight loss.  EYES: Denies blurry vision, double vision, inflammation, glaucoma.  ENT: Denies tinnitus, ear pain, hearing loss, epistaxis or discharge.  RESPIRATORY: Denies cough, wheezing, hemoptysis. Reports shortness of breath.  CARDIOVASCULAR: Reports occasional chest tightness on exertion, but nothing recent. Denies  any edema, palpitations, syncope. Reports mild orthopnea.  GASTROINTESTINAL: Denies nausea, vomiting, diarrhea, abdominal pain, hematemesis.  GENITOURINARY: Denies dysuria, hematuria or renal colic.  ENDOCRINE: Denies polyuria, polydipsia, heat or cold intolerance.  HEMATOLOGY: Denies anemia, easy bruising, bleeding diathesis. INTEGUMENTARY: Denies acne, rash or skin lesion.  MUSCULOSKELETAL: Denies any gout, swelling, arthritis,  cramps.  NEUROLOGIC: Denies CVA, TIA, tremors, ataxia, dementia.  PSYCHIATRIC: Denies anxiety, insomnia or depression.   PHYSICAL EXAMINATION:  VITAL SIGNS: Temperature 98.3, pulse 77, respiratory rate 18, blood pressure 131/66, saturating 97% on room air.  GENERAL: Well-nourished female who looks comfortable in bed, in no apparent distress.  HEENT: Head atraumatic, normocephalic. Pupils equally reactive to light. Pink conjunctivae. Anicteric sclerae. Moist oral mucosa.  NECK: Supple. No thyromegaly. No JVD.  CHEST: Good air entry bilaterally. No wheezing, rales or rhonchi, but has scattered crackles at the bases.  CARDIOVASCULAR: S1, S2 heard. No rubs, murmurs or gallops.  ABDOMEN: Soft, nontender, nondistended. Bowel sounds present.  EXTREMITIES: Mild pitting edema around the ankle, but no clubbing, no cyanosis. Pedal and radial pulses +2 bilaterally.  PSYCHIATRIC: Appropriate affect. Awake, alert x3. Intact judgment and insight.  NEUROLOGIC: Cranial nerves grossly intact. Motor 5 out of 5. No focal deficits.  MUSCULOSKELETAL: No joint effusion or erythema.   PERTINENT LABORATORY DATA: Glucose 113, BNP 2508, BUN 10, creatinine 0.86, sodium 141, potassium 3.8, chloride 109, CO2 25. Troponin less than 0.02. White blood cells 6.6, hemoglobin 11.5, hematocrit 36, platelets 210.   IMAGING: Chest x-ray: Probable emphysematous changes. No evidence of active pulmonary disease.   ASSESSMENT AND PLAN:  1. Shortness of breath, orthopnea. This is most likely due to congestive heart failure, acute systolic, appears to be mild. The patient will be started on IV Lasix 20 mg b.i.d. She is already on Coreg, aspirin. Will discuss with cardiology if need to start her on ACE inhibitor right now. Will check her daily weight, strict ins and outs.  2. Chest pain, occasional, nothing recent, but the patient was given aspirin in the ED.  Will cycle her cardiac enzymes. Will follow her on telemetry.  3.  Hypertension. Blood pressure is acceptable. Continue with home medication.  4. Hyperlipidemia. Continue with statin.  5. Gastroesophageal reflux disease. Continue with PPI.  6. Hypothyroidism. Continue with Synthroid.  7. Deep vein thrombosis prophylaxis. Subcutaneous heparin.   CODE STATUS: Full code.   TOTAL TIME SPENT ON ADMISSION AND PATIENT CARE: 50 minutes.   ____________________________ Albertine Patricia, MD dse:lb D: 07/03/2013 07:40:35 ET T: 07/03/2013 07:57:38 ET JOB#: [id number]  cc: Albertine Patricia, MD, <Dictator> Torri Langston Graciela Husbands MD ELECTRONICALLY SIGNED 07/04/2013 4:01

## 2014-06-12 NOTE — Consult Note (Signed)
General Aspect 74 y/o female with a h/o CAD s/p recent inferolateral STEMI (med rx) and newly dx ICM, who was admitted with acute onset of acute on chronic dyspnea. Cardiology was consulted for PE.   Present Illness 73 y/o female with a prior h/o Takotsubo CM (with subsequent normalization of LV fxn), coronary vasospasm, HTN, HL, hypothyroidism, GERD, breast CA (s/p bilat mastectomies), pulmonary nodules, and allergic rhinitis.  In early April, with acute inferolateral STEMI.  Cath showed a total occlusion of the distal LCX with moderate dzs in a small (58m) distal RPDA.  The LCX was felt to be the culprit and attempts were made @ PCI, however were unsuccessful and ultimately the case was aborted and medical rx instituted.  EF was 40-45% by echo. Post-cath, she had significant NSVT and was placed on amio.  This was later d/c'd 2/2 ongoing nausea and BB was titrated.    Prior to admission several days ago, she had been doing reasonably well, walking 15-18 mins twice daily w/o c/p or significant dyspnea. She presented to the hospital several days ago for worsening cough and fatigue, mild acute SOB felt secondary to reactive airway disease and diastolic CHF. IMproved sx with diuresis and pulmonary rx.  Acute onset of worsening SOB last night getting out of bed, moderate in intensity, worse than before. Tightness in her chest with breathing. She presented to the ER, found to have PE and left leg DVT. Started on heparin.   Physical Exam:  GEN well developed, pleasant, nad.   HEENT pink conjunctivae, moist oral mucosa, normal.   NECK supple   RESP normal resp effort  crackles  on the left, scant   CARD Regular rate and rhythm  Normal, S1, S2  No murmur   ABD denies tenderness  soft  normal BS   LYMPH negative neck   EXTR negative edema   SKIN normal to palpation   NEURO follows commands, motor/sensory function intact, grossly intact, nonfocal.   PSYCH alert   Review of Systems:   Subjective/Chief Complaint Cough, SOB with exertion   General: No Complaints   Skin: No Complaints   ENT: No Complaints   Eyes: No Complaints   Neck: No Complaints   Respiratory: Frequent cough  Short of breath   Cardiovascular: Dyspnea  Orthopnea   Gastrointestinal: No Complaints   Genitourinary: No Complaints   Vascular: No Complaints   Musculoskeletal: No Complaints   Neurologic: No Complaints   Hematologic: No Complaints   Endocrine: No Complaints   Psychiatric: No Complaints   Review of Systems: All other systems were reviewed and found to be negative   Medications/Allergies Reviewed Medications/Allergies reviewed   Family & Social History:  Family and Social History:  Family History Coronary Artery Disease  2 brothers s/p CABG. Another brother w/ leukemia.  Mother died of CVA..Marland Kitchenon further discussion, mother with DVTs, brother with DVT   Social History negative tobacco, negative ETOH, negative Illicit drugs   Place of Living Home  [place]  She is a retired sEducation officer, museum   Home Medications: Medication Instructions Status  clopidogrel 75 mg oral tablet 1 tab(s) orally once a day Active  levothyroxine 75 mcg (0.075 mg) oral tablet 1 tab(s) orally once a day Active  Caltrate 600 + D 600 mg-800 intl units oral tablet 1 tab(s) orally 2 times a day Active  NexIUM 40 mg oral delayed release capsule 1 cap(s) orally once a day Active  fexofenadine 180 mg  oral tablet 1 tab(s) orally once a day Active  B-12 1000 mcg oral tablet 1 tab(s) orally once a day Active  ranitidine 150 mg oral tablet 1 tab(s) orally once a day Active  magnesium hydroxide 400 mg oral tablet, chewable  orally once a day Active  Vitamin D3 1000 intl units oral capsule 1 cap(s) orally once a day Active  docusate sodium sodium 100 mg oral capsule 2 tab(s) orally once a day (at bedtime) Active  potassium chloride 10 mEq oral capsule, extended release  orally once a  day Active  pravastatin 20 mg oral tablet 1 tab(s) orally once a day (at bedtime) Active  furosemide 20 mg oral tablet 1 tab(s) orally once a day Active  Nasonex 50 microgram(s) nasal once a day (at bedtime) Active  isosorbide mononitrate 30 mg oral tablet, extended release 0.5 tab(s) orally once a day (at bedtime) Active   Lab Results: LabObservation:  17-May-15 09:47   OBSERVATION Reason for Test Positive Known Pulmonary Embolus  Lab:  17-May-15 06:50   pH (ABG)  7.52  PCO2  30  PO2  67  FiO2 32  Base Excess 2.4  HCO3 24.5  O2 Saturation 95.5  Specimen Site (ABG) LT RADIAL  Specimen Type (ABG) ARTERIAL  Patient Temp (ABG) 37.0 (Result(s) reported on 05 Jul 2013 at 06:59AM.)  Routine Chem:  17-May-15 06:10   Result Comment TROPONIN - RESULTS VERIFIED BY REPEAT TESTING.  - C/RACHEL HAYDEN/0642/07-05-13/RWW  - READ-BACK PROCESS PERFORMED.  Result(s) reported on 05 Jul 2013 at 06:49AM.  B-Type Natriuretic Peptide [facility])  [phone] (Result(s) reported on 05 Jul 2013 at 07:07AM.)  Glucose, Serum  131  BUN  19  Creatinine (comp) 1.06  Sodium, Serum  135  Potassium, Serum 3.7  Chloride, Serum 104  CO2, Serum 25  Calcium (Total), Serum 9.7  Anion Gap  6  Osmolality (calc) 274  eGFR (African American) >60  eGFR (Non-African American)  52 (eGFR values <29m/min/1.73 m2 may be an indication of chronic kidney disease (CKD). Calculated eGFR is useful in patients with stable renal function. The eGFR calculation will not be reliable in acutely ill patients when serum creatinine is changing rapidly. It is not useful in  patients on dialysis. The eGFR calculation may not be applicable to patients at the low and high extremes of body sizes, pregnant women, and vegetarians.)    10:17   Result Comment TROPONIN - RESULTS VERIFIED BY REPEAT TESTING.  - RESULT CALLED PREVIOUSLY 07/05/13  - AT [id number]  Result(s) reported on 05 Jul 2013 at 11:17AM.  Cardiac:  17-May-15 06:10    Troponin I  0.49 (0.00-0.05 0.05 ng/mL or less: NEGATIVE  Repeat testing in 3-6 hrs  if clinically indicated. >0.05 ng/mL: POTENTIAL  MYOCARDIAL INJURY. Repeat  testing in 3-6 hrs if  clinically indicated. NOTE: An increase or decrease  of 30% or more on serial  testing suggests a  clinically important change)  CPK-MB, Serum 1.6 (Result(s) reported on 05 Jul 2013 at 07:34AM.)    10:17   Troponin I  0.75 (0.00-0.05 0.05 ng/mL or less: NEGATIVE  Repeat testing in 3-6 hrs  if clinically indicated. >0.05 ng/mL: POTENTIAL  MYOCARDIAL INJURY. Repeat  testing in 3-6 hrs if  clinically indicated. NOTE: An increase or decrease  of 30% or more on serial  testing suggests a  clinically important change)  CPK-MB, Serum 2.4 (Result(s) reported on 05 Jul 2013 at 11:05AM.)  Routine Coag:  17-May-15 06:52   Prothrombin  13.2  INR 1.0 (INR reference interval applies to patients on anticoagulant therapy. A single INR therapeutic range for coumarins is not optimal for all indications; however, the suggested range for most indications is 2.0 - 3.0. Exceptions to the INR Reference Range may include: Prosthetic heart valves, acute myocardial infarction, prevention of myocardial infarction, and combinations of aspirin and anticoagulant. The need for a higher or lower target INR must be assessed individually. Reference: The Pharmacology and Management of the Vitamin K  antagonists: the seventh ACCP Conference on Antithrombotic and Thrombolytic Therapy. NGEXB.2841 Sept:126 (3suppl): [id number]. A HCT value >55% may artifactually increase the PT.  In one study,  the increase was an average of 25%. Reference:  "Effect on Routine and Special Coagulation Testing Values of Citrate Anticoagulant Adjustment in Patients with High HCT Values." American Journal of Clinical Pathology 2006;126:400-405.)  D-Dimer, Quantitative > 7500 (INTERPRETATION <> Exclusion of Venous Thromboembolism (VTE) - OUTPATIENT  ONLY       (Emergency Department or [place])             0-499 ng/ml (FEU)  : With a low to intermediate pretest                                  probability for VTE this test result                                  excludes the diagnosis of VTE.             > 499 ng/ml (FEU)  : VTE not excluded; additional work up                                  for VTE is required. <> Testing on Inpatients and Evaluation of Disseminated Intravascular        Coagulation (DIC)             Reference Range:  0-499 ng/ml (FEU))  Activated PTT (APTT) 27.5 (A HCT value >55% may artifactually increase the APTT. In one study, the increase was an average of 19%. Reference: "Effect on Routine and Special Coagulation Testing Values of Citrate Anticoagulant Adjustment in Patients with High HCT Values." American Journal of Clinical Pathology 2006;126:400-405.)  Routine Hem:  17-May-15 06:10   WBC (CBC)  12.3  RBC (CBC) 4.97  Hemoglobin (CBC) 12.3  Hematocrit (CBC) 39.5  Platelet Count (CBC) 242 (Result(s) reported on 05 Jul 2013 at [facility].)  MCV  79  MCH  24.7  MCHC  31.0  RDW  15.4   EKG:  EKG Interp. by me   Interpretation EKG shows RSR, 95, inf infarct, poor r prog, lat TWI - unchanged since last admission @ [facility].   Radiology Results: XRay:    17-May-15 07:07, Chest Portable Single View  Chest Portable Single View   REASON FOR EXAM:    SOB  COMMENTS:       PROCEDURE: DXR - DXR PORTABLE CHEST SINGLE VIEW  - Jul 05 2013  7:07AM     CLINICAL DATA:  Shortness of breath and cough    EXAM:  PORTABLE CHEST - 1 VIEW    COMPARISON:  Jul 03, 2013    FINDINGS:  There is underlying emphysematous change. There is no edema or  consolidation. Heart is upper  normal in size. The pulmonary  vascularity is within normal limits. There are surgical clips in  both axillary regions. No bone lesions are identified.     IMPRESSION:  Underlying emphysematous change.  No edema or  consolidation.      Electronically Signed    By: Lowella Grip M.D.    On: 07/05/2013 07:09         Verified By: Leafy Kindle. WOODRUFF, M.D.,  [place]:    17-May-15 09:47, [place] Color Flow Doppler Low Extrem Bilat (Legs)  [place] Color Flow Doppler Low Extrem Bilat (Legs)   REASON FOR EXAM:    Positive Known Pulmonary Embolus  COMMENTS:       PROCEDURE: [place]  - US DOPPLER LOW EXTR BILATERAL  - Jul 05 2013  9:47AM     CLINICAL DATA:  Pulmonary emboli    EXAM:  BILATERAL LOWER EXTREMITY VENOUS DUPLEX ULTRASOUND    TECHNIQUE:  Gray-scale sonography with graded compression, as well as color  Doppler and duplex ultrasound were performed to evaluate the lower  extremity deep venous systems from the level of the common femoral  vein and including the common femoral, femoral,profunda femoral,  popliteal and calf veins including the posterior tibial, peroneal  and gastrocnemius veins when visible. The superficial great  saphenous vein was also interrogated. Spectral Doppler was utilized  to evaluate flow at rest and with distal augmentation maneuvers in  the common femoral, femoral and popliteal veins.    COMPARISON:  None.    FINDINGS:  On the left side, there is focal thrombus in the mid to distal left  popliteal vein which is incompletely obstructing. There is  diminished Doppler signal in this area as well as diminished  compressibility and augmentation in this area of thrombus focally in  the left popliteal vein. No other thrombus is seen on the left in  the deep or visualized superficial venous structures. Flow is  spontaneous and phasic throughout the left deep venous system except  for the area of thrombus in the left popliteal vein. Other areas of  the left deep venous system show normal compression and  augmentation. There is no left lower extremity deep venous  incompetence.    On the right side, flow is spontaneous and phasic in all venous  segments. There is normal  compression and augmentation throughout  the venous structures of the right lower extremity. Venous Doppler  signal is normal in all regions on the right. There is no thrombus  in the deep or visualized superficial venous structures on the  right. There is no right-sided deep venous incompetence.     IMPRESSION:  Incompletely obstructing deep venous thrombosis in the mid to distal  left popliteal vein. No other deep venous thrombi identified on  either side. Study otherwise unremarkable.      Electronically Signed    By: Lowella Grip M.D.    On: 07/05/2013 09:52         Verified By: Leafy Kindle. WOODRUFF, M.D.,    Morphine: N/V, Other  Novocain: Other  Codeine: Unknown  Vicodin: Unknown  Prednisone: Insomnia  amiodarone: N/V/Diarrhea  Procaine Hydrochloride: N/V/Diarrhea   Impression 1.  Acute on chronic respiratory distress: acute PE last night unable to exclude small PE in the past as cough and SOB (mild) started weeks ago.  She does report cough back several months family hx of DVT (mother and brother) Patient has been very sedentary recently --on heprain bridge with warfarin  2) Elevated troponin:  from right heart stretch, secondary to PE --Medical management of PE with anticoagulation  3) Acute on chronic systolic and diastolic  CHF/ICM:  Could give lasix 20 mg po daily, extra lasix 20 mg IV as needed  4).  CAD:   s/p inferolateral STEMI 05/2013.  She has a known occlusion of the distal LCX and severe dzs in a small, distal PDA (76m vessel).  on heparin, warfarin, asa would hold plavix   5.  Relative Hypotn:    coreg to 6.257mbid  6.  HL:   On pravastatin. F/U lipids/lft's.  7.  NSVT:   Follow on tele. Cont bb . 8.  Hypothyroidism:   Synthroid dose reduced last month when TSH was 0.271.  F/U TSH.   Electronic Signatures: GoIda RogueMD)  (Signed 17-May-15 11:55)  Authored: General Aspect/Present Illness, History and Physical Exam, Review of  System, Family & Social History, Home Medications, Labs, EKG , Radiology, Allergies, Impression/Plan   Last Updated: 17-May-15 11:55 by GoIda RogueMD)

## 2014-06-12 NOTE — H&P (Signed)
PATIENT NAME:  WING, GFELLER MR#:  [id number] DATE OF BIRTH:  03/16/1940  DATE OF ADMISSION:  07/05/2013  A 74 year old female presents with submassive pulmonary emboli, bilateral. She was hospitalized two days ago with some dyspnea on exertion and cough. She was not hypoxic. A chest x-ray was clear. She had some expiratory wheezing and was diagnosed with reactive airway disease. She went home and began having more shortness of breath and came into the Emergency Department early this morning showing bilateral pulmonary emboli, right more than left and a small nodular densities. She had a room air pulse oximetry of 82% at that time. She was given a heparin bolus and will be going to heparin nomogram. She has not had any recent surgeries or any predisposition toward blood clot. She has not had any swelling of her legs or deep vein thrombosis or phlebitis-type symptoms. She has not been incapacitated or in bed for any period of time, and she has no family history of thromboembolic disease. She has a little bit of tightness in her chest this morning, but otherwise has no pleuritic chest pain. Bowels have been stable. No significant abdominal pain. Her weight is stable. She will be admitted for further evaluation and treatment.   PAST MEDICAL HISTORY: Medical illnesses:  1. Coronary artery disease.  2. Hypothyroidism.  3. History of breast cancer.  4. Restless leg syndrome.  5. Ischemic cardiomyopathy, ejection fraction of 40%  with recent hospitalization for myocardial infarction.  6. Hyperlipidemia.  7. GERD.   PAST SURGICAL HISTORY: Goiter, lumpectomy,  modified radical left mastectomy, right mastectomy.   ALLERGIES: MORPHINE, NOVOCAIN, CODEINE, HYDROCODONE.   MEDICATIONS: Plavix 75 mg daily. B12 1000 mcg daily, calcium vitamin D b.i.d. Coreg 6.25 mg b.i.d. Valium p.r.n. Nexium 40 mg every a.m., ranitidine 150 mg at bedtime. Allegra 180 mg daily. Lasix 20 mg daily. Imdur 30 mg daily. Synthroid 75  mcg daily, magnesium oxide 400 mg daily. Potassium chloride 10 mEq daily. Pravastatin 20 mg at bedtime trazodone 50 mg at bedtime. Vitamin D daily.   SOCIAL HISTORY: Married. No smoking or alcohol.   FAMILY HISTORY: Noncontributory.   REVIEW OF SYSTEMS: Otherwise negative.   PHYSICAL EXAMINATION: VITAL SIGNS: Blood pressure 135/75, pulse 90 and regular.  HEENT: Pupils are equal and reactive.  NECK: No thyromegaly or bruits.  LUNGS: Clear to auscultation and percussion.  HEART: Slightly tachycardiac. No murmur.  ABDOMEN: Some mild diffuse tenderness. No rebound.  EXTREMITIES: No edema. No erythema. Good peripheral pulses.  NEUROLOGIC: Grossly nonfocal.   LABS: White count 12,000. CT of the chest shows bilateral pulmonary emboli, right greater than left.   ASSESSMENT AND PLAN: 1. Pulmonary embolism - no predisposing history or family history. We will do abdominopelvic CT scan to evaluate for a source, nothing in her legs to indicate that. We will scan her legs as well by ultrasound to be sure, place her on IV heparin nomogram with conversion to Coumadin. The patient request Coumadin over Eliquis or Xarelto. 2. Cardiac enzymes - first troponin 5.9. We will get cardiology involved, possibly from right heart strain. We will follow closely.  3. Ischemic cardiomyopathy, ejection fraction of 40%. Follow closely for volume overload.    ____________________________ Rusty Aus, MD mfm:sg D: 07/05/2013 08:40:21 ET T: 07/05/2013 09:15:06 ET JOB#: [id number]  cc: Rusty Aus, MD, <Dictator> MARK Roselee Culver MD ELECTRONICALLY SIGNED 07/05/2013 10:39

## 2014-06-12 NOTE — Discharge Summary (Signed)
PATIENT NAME:  [name], [name] MR#:  [id number] DATE OF BIRTH:  01/05/1941  DATE OF ADMISSION:  07/05/2013 DATE OF DISCHARGE: 07/10/2013   DISCHARGE DIAGNOSES:  1.  Submassive bilateral pulmonary emboli with hypoxia.  2,  Ischemic cardiomyopathy, ejection fraction 40% with chronic systolic congestive heart failure.  3.  Restless leg syndrome.  4.  Hypothyroidism.  5.  Coronary artery disease.  6.  Hyperlipidemia.  7.  Gastroesophageal reflux disease.  8.  Left leg deep venous thrombosis.  DISCHARGE MEDICATIONS: Coumadin 3 mg at bedtime, Caltrate D b.i.d., Nexium 40 mg every a.m., Allegra 180 mg daily, ranitidine 150 mg at bedtime, magnesium 400 mg daily, vitamin D3 1000 units daily, potassium chloride 10 mEq daily, Pravastatin 20 mg at bedtime, furosemide 20 mg daily, Nasonex 1 spray at bedtime, Imdur 15 mg daily, Synthroid 75 mcg daily and aspirin 81 mg daily.   REASON FOR ADMISSION: A 74 year old female, who presents with hypoxia, chest pain and bilateral pulmonary emboli. Please see H and P for HPI, past medical history and physical exam.     HOSPITAL COURSE: The patient was admitted. Found by CT to have right greater than left submassive pulmonary emboli. She was started on IV heparin and per patient desire converted to p.o. Coumadin. Her INR was 2.9 on discharge. She received 5 days of IV heparin. Her cough improved. Her hypoxia improved, but she will need 2 L O2 nasal cannula on discharge. She will follow up with Dr. Sabra Heck on Tuesday. There is no clear cause to the clot, although a left leg DVT was found. No malignancy was noted on chest or abdominal CT scans. Her Factor V Leiden is pending. Her d-dimer was greater than 7500. She has been less active lately, but not bedridden per se and perhaps that is what ultimately led to the DVT and PE. She never had any DVT symptoms. Overall stable for discharge. ____________________________ Rusty Aus, MD mfm:aw D: 07/10/2013 07:58:47  ET T: 07/10/2013 09:36:22 ET JOB#: [id number]  cc: Rusty Aus, MD, <Dictator> Chastidy Ranker Roselee Culver MD ELECTRONICALLY SIGNED 07/14/2013 8:30

## 2014-06-12 NOTE — Discharge Summary (Signed)
PATIENT NAME:  [name], [name] MR#:  [id number] DATE OF BIRTH:  1940/05/08  DATE OF ADMISSION:  07/03/2013 DATE OF DISCHARGE:  07/04/2013.  DISCHARGE DIAGNOSES:  1.  Reactive airway disease with acute dyspnea.  2.  Coronary artery disease.  3.  Cardiomyopathy, ejection fraction 45%.  4.  Hypothyroidism.  5.  Gastroesophageal reflux disease.  6.  Hyperlipidemia.   DISCHARGE MEDICATIONS:  Plavix 75 mg daily, Singulair 10 mg daily, Allegra 180 mg daily, Caltrate D daily, Nexium 40 mg every a.m., carvedilol 6.5 mg b.i.d., ranitidine 150 mg at bedtime, magnesium 400 mg daily, Colace 100 mg 2 at bedtime, potassium chloride 10 mEq daily, pravastatin 20 mg at bedtime, furosemide 20 mg daily, Nasonex 2 sprays daily, Imdur 30 mg 1/2 tab a.m., Synthroid 75 mcg daily.  REASON FOR ADMISSION:  A 74 year old female presents with acute dyspnea. Please see H and P for history of present illness, past medical history and physical exam.   HOSPITAL COURSE:  The patient was admitted. Cardiac enzymes normal. She really had no significant volume overload on exam nor by chest x-ray. Dr. Rockey Situ of Cardiology felt like this was likely not CHF. She was given IV Lasix, nonetheless, with very little diuresis. On exam she had some mild expiratory wheezing. Her history is consistent with allergy-induced reactive airway disease. She was given in Solu-Medrol 40 mg IM x 1. Since she cannot take oral prednisone, will go on Singulair plus Allegra with followup with Dr. Sabra Heck as needed. There is no sign for acute coronary syndrome or acute pulmonary distress.  ____________________________ Rusty Aus, MD mfm:jm D: 07/04/2013 08:39:53 ET T: 07/04/2013 11:16:56 ET JOB#: [id number]  cc: Rusty Aus, MD, <Dictator> Rusty Aus MD ELECTRONICALLY SIGNED 07/05/2013 10:38

## 2014-08-16 ENCOUNTER — Other Ambulatory Visit: Payer: Self-pay

## 2014-08-18 ENCOUNTER — Other Ambulatory Visit: Payer: Self-pay | Admitting: Orthopedic Surgery

## 2014-08-18 DIAGNOSIS — M1711 Unilateral primary osteoarthritis, right knee: Secondary | ICD-10-CM

## 2014-10-28 ENCOUNTER — Ambulatory Visit
Admission: RE | Admit: 2014-10-28 | Discharge: 2014-10-28 | Disposition: A | Discharge status: Home / Self Care | Payer: Medicare Other | Source: Ambulatory Visit | Attending: Orthopedic Surgery | Admitting: Orthopedic Surgery

## 2014-10-28 DIAGNOSIS — M1711 Unilateral primary osteoarthritis, right knee: Secondary | ICD-10-CM

## 2014-11-22 NOTE — Progress Notes (Signed)
Need orders for 10-14 surgery in epic

## 2014-11-25 ENCOUNTER — Ambulatory Visit: Payer: Self-pay | Admitting: Orthopedic Surgery

## 2014-11-25 NOTE — Progress Notes (Signed)
Preoperative surgical orders have been place into the Epic hospital system for [name] on 11/25/2014, 9:52 AM  by Mickel Crow for surgery on 12-13-2014.  Preop Uni Knee orders including Experal, IV Tylenol, and IV Decadron as long as there are no contraindications to the above medications. Arlee Muslim, PA-C

## 2014-12-06 NOTE — Patient Instructions (Signed)
[name]  12/06/2014   Your procedure is scheduled on: Monday 12/13/2014  Report to [facility] Main  Entrance take [place]  elevators to 3rd floor to  [place] at  0730 AM.  Call this number if you have problems the morning of surgery [phone]   Remember: ONLY 1 PERSON MAY GO WITH YOU TO SHORT STAY TO GET  READY MORNING OF [place].  Do not eat food or drink liquids :After Midnight.     Take these medicines the morning of surgery with A SIP OF WATER: METOPROLOL, LEXAPRO, LEVOTHYROXINE, ALLEGRA  DO NOT TAKE ANY DIABETIC MEDICATIONS DAY OF YOUR SURGERY                               You may not have any metal on your body including hair pins and              piercings  Do not wear jewelry, make-up, lotions, powders or perfumes, deodorant             Do not wear nail polish.  Do not shave  48 hours prior to surgery.              Men may shave face and neck.   Do not bring valuables to the hospital. [place].  Contacts, dentures or bridgework may not be worn into surgery.  Leave suitcase in the car. After surgery it may be brought to your room.     Patients discharged the day of surgery will not be allowed to drive home.  Name and phone number of your driver:  Special Instructions: N/A              Please read over the following fact sheets you were given: _____________________________________________________________________             [facility] - Preparing for Surgery Before surgery, you can play an important role.  Because skin is not sterile, your skin needs to be as free of germs as possible.  You can reduce the number of germs on your skin by washing with CHG (chlorahexidine gluconate) soap before surgery.  CHG is an antiseptic cleaner which kills germs and bonds with the skin to continue killing germs even after washing. Please DO NOT use if you have an allergy to CHG or  antibacterial soaps.  If your skin becomes reddened/irritated stop using the CHG and inform your nurse when you arrive at Short Stay. Do not shave (including legs and underarms) for at least 48 hours prior to the first CHG shower.  You may shave your face/neck. Please follow these instructions carefully:  1.  Shower with CHG Soap the night before surgery and the  morning of Surgery.  2.  If you choose to wash your hair, wash your hair first as usual with your  normal  shampoo.  3.  After you shampoo, rinse your hair and body thoroughly to remove the  shampoo.                           4.  Use CHG as you would any other liquid soap.  You can apply chg directly  to  the skin and wash                       Gently with a scrungie or clean washcloth.  5.  Apply the CHG Soap to your body ONLY FROM THE NECK DOWN.   Do not use on face/ open                           Wound or open sores. Avoid contact with eyes, ears mouth and genitals (private parts).                       Wash face,  Genitals (private parts) with your normal soap.             6.  Wash thoroughly, paying special attention to the area where your surgery  will be performed.  7.  Thoroughly rinse your body with warm water from the neck down.  8.  DO NOT shower/wash with your normal soap after using and rinsing off  the CHG Soap.                9.  Pat yourself dry with a clean towel.            10.  Wear clean pajamas.            11.  Place clean sheets on your bed the night of your first shower and do not  sleep with pets. Day of Surgery : Do not apply any lotions/deodorants the morning of surgery.  Please wear clean clothes to the hospital/surgery center.  FAILURE TO FOLLOW THESE INSTRUCTIONS MAY RESULT IN THE CANCELLATION OF YOUR SURGERY PATIENT SIGNATURE_________________________________  NURSE SIGNATURE__________________________________  ________________________________________________________________________   Adam Phenix  An incentive spirometer is a tool that can help keep your lungs clear and active. This tool measures how well you are filling your lungs with each breath. Taking long deep breaths may help reverse or decrease the chance of developing breathing (pulmonary) problems (especially infection) following:  A long period of time when you are unable to move or be active. BEFORE THE PROCEDURE   If the spirometer includes an indicator to show your best effort, your nurse or respiratory therapist will set it to a desired goal.  If possible, sit up straight or lean slightly forward. Try not to slouch.  Hold the incentive spirometer in an upright position. INSTRUCTIONS FOR USE   Sit on the edge of your bed if possible, or sit up as far as you can in bed or on a chair.  Hold the incentive spirometer in an upright position.  Breathe out normally.  Place the mouthpiece in your mouth and seal your lips tightly around it.  Breathe in slowly and as deeply as possible, raising the piston or the ball toward the top of the column.  Hold your breath for 3-5 seconds or for as long as possible. Allow the piston or ball to fall to the bottom of the column.  Remove the mouthpiece from your mouth and breathe out normally.  Rest for a few seconds and repeat Steps 1 through 7 at least 10 times every 1-2 hours when you are awake. Take your time and take a few normal breaths between deep breaths.  The spirometer may include an indicator to show your best effort. Use the indicator as a goal to work toward during each repetition.  After each set  of 10 deep breaths, practice coughing to be sure your lungs are clear. If you have an incision (the cut made at the time of surgery), support your incision when coughing by placing a pillow or rolled up towels firmly against it. Once you are able to get out of bed, walk around indoors and cough well. You may stop using the incentive spirometer when instructed by  your caregiver.  RISKS AND COMPLICATIONS  Take your time so you do not get dizzy or light-headed.  If you are in pain, you may need to take or ask for pain medication before doing incentive spirometry. It is harder to take a deep breath if you are having pain. AFTER USE  Rest and breathe slowly and easily.  It can be helpful to keep track of a log of your progress. Your caregiver can provide you with a simple table to help with this. If you are using the spirometer at home, follow these instructions: [place] IF:   You are having difficultly using the spirometer.  You have trouble using the spirometer as often as instructed.  Your pain medication is not giving enough relief while using the spirometer.  You develop fever of 100.5 F (38.1 C) or higher. SEEK IMMEDIATE MEDICAL CARE IF:   You cough up bloody sputum that had not been present before.  You develop fever of 102 F (38.9 C) or greater.  You develop worsening pain at or near the incision site. MAKE SURE YOU:   Understand these instructions.  Will watch your condition.  Will get help right away if you are not doing well or get worse. Document Released: 06/18/2006 Document Revised: 04/30/2011 Document Reviewed: 08/19/2006 ExitCare Patient Information 2014 ExitCare, [place].   ________________________________________________________________________  WHAT IS A BLOOD TRANSFUSION? Blood Transfusion Information  A transfusion is the replacement of blood or some of its parts. Blood is made up of multiple cells which provide different functions.  Red blood cells carry oxygen and are used for blood loss replacement.  White blood cells fight against infection.  Platelets control bleeding.  Plasma helps clot blood.  Other blood products are available for specialized needs, such as hemophilia or other clotting disorders. BEFORE THE TRANSFUSION  Who gives blood for transfusions?   Healthy volunteers who are  fully evaluated to make sure their blood is safe. This is blood bank blood. Transfusion therapy is the safest it has ever been in the practice of medicine. Before blood is taken from a donor, a complete history is taken to make sure that person has no history of diseases nor engages in risky social behavior (examples are intravenous drug use or sexual activity with multiple partners). The donor's travel history is screened to minimize risk of transmitting infections, such as malaria. The donated blood is tested for signs of infectious diseases, such as HIV and hepatitis. The blood is then tested to be sure it is compatible with you in order to minimize the chance of a transfusion reaction. If you or a relative donates blood, this is often done in anticipation of surgery and is not appropriate for emergency situations. It takes many days to process the donated blood. RISKS AND COMPLICATIONS Although transfusion therapy is very safe and saves many lives, the main dangers of transfusion include:   Getting an infectious disease.  Developing a transfusion reaction. This is an allergic reaction to something in the blood you were given. Every precaution is taken to prevent this. The decision to have a  blood transfusion has been considered carefully by your caregiver before blood is given. Blood is not given unless the benefits outweigh the risks. AFTER THE TRANSFUSION  Right after receiving a blood transfusion, you will usually feel much better and more energetic. This is especially true if your red blood cells have gotten low (anemic). The transfusion raises the level of the red blood cells which carry oxygen, and this usually causes an energy increase.  The nurse administering the transfusion will monitor you carefully for complications. HOME CARE INSTRUCTIONS  No special instructions are needed after a transfusion. You may find your energy is better. Speak with your caregiver about any limitations on  activity for underlying diseases you may have. SEEK MEDICAL CARE IF:   Your condition is not improving after your transfusion.  You develop redness or irritation at the intravenous (IV) site. SEEK IMMEDIATE MEDICAL CARE IF:  Any of the following symptoms occur over the next 12 hours:  Shaking chills.  You have a temperature by mouth above 102 F (38.9 C), not controlled by medicine.  Chest, back, or muscle pain.  People around you feel you are not acting correctly or are confused.  Shortness of breath or difficulty breathing.  Dizziness and fainting.  You get a rash or develop hives.  You have a decrease in urine output.  Your urine turns a dark color or changes to pink, red, or brown. Any of the following symptoms occur over the next 10 days:  You have a temperature by mouth above 102 F (38.9 C), not controlled by medicine.  Shortness of breath.  Weakness after normal activity.  The white part of the eye turns yellow (jaundice).  You have a decrease in the amount of urine or are urinating less often.  Your urine turns a dark color or changes to pink, red, or brown. Document Released: 02/03/2000 Document Revised: 04/30/2011 Document Reviewed: 09/22/2007 [facility] Patient Information 2014 [place], [place].  _______________________________________________________________________

## 2014-12-07 ENCOUNTER — Encounter (HOSPITAL_COMMUNITY)
Admission: RE | Admit: 2014-12-07 | Discharge: 2014-12-07 | Disposition: A | Discharge status: Home / Self Care | Payer: Medicare Other | Source: Ambulatory Visit | Attending: Orthopedic Surgery | Admitting: Orthopedic Surgery

## 2014-12-07 ENCOUNTER — Encounter (HOSPITAL_COMMUNITY): Payer: Self-pay

## 2014-12-07 DIAGNOSIS — Z01818 Encounter for other preprocedural examination: Secondary | ICD-10-CM | POA: Diagnosis not present

## 2014-12-07 DIAGNOSIS — Z7901 Long term (current) use of anticoagulants: Secondary | ICD-10-CM | POA: Diagnosis not present

## 2014-12-07 DIAGNOSIS — M179 Osteoarthritis of knee, unspecified: Secondary | ICD-10-CM | POA: Diagnosis not present

## 2014-12-07 HISTORY — DX: Unspecified osteoarthritis, unspecified site: M19.90

## 2014-12-07 HISTORY — DX: Acute myocardial infarction, unspecified: I21.9

## 2014-12-07 HISTORY — DX: Cardiac arrhythmia, unspecified: I49.9

## 2014-12-07 LAB — PROTIME-INR
INR: 2.09 — AB (ref 0.00–1.49)
Prothrombin Time: 23.3 seconds — ABNORMAL HIGH (ref 11.6–15.2)

## 2014-12-07 LAB — COMPREHENSIVE METABOLIC PANEL
ALBUMIN: 4.5 g/dL (ref 3.5–5.0)
ALT: 13 U/L — AB (ref 14–54)
AST: 20 U/L (ref 15–41)
Alkaline Phosphatase: 110 U/L (ref 38–126)
Anion gap: 6 (ref 5–15)
BUN: 26 mg/dL — ABNORMAL HIGH (ref 6–20)
CHLORIDE: 106 mmol/L (ref 101–111)
CO2: 28 mmol/L (ref 22–32)
CREATININE: 1 mg/dL (ref 0.44–1.00)
Calcium: 9.5 mg/dL (ref 8.9–10.3)
GFR calc non Af Amer: 55 mL/min — ABNORMAL LOW (ref >60)
Glucose, Bld: 97 mg/dL (ref 65–99)
Potassium: 4.6 mmol/L (ref 3.5–5.1)
SODIUM: 140 mmol/L (ref 135–145)
Total Bilirubin: 0.6 mg/dL (ref 0.3–1.2)
Total Protein: 7.4 g/dL (ref 6.5–8.1)

## 2014-12-07 LAB — CBC
HCT: 37.8 % (ref 36.0–46.0)
Hemoglobin: 11.7 g/dL — ABNORMAL LOW (ref 12.0–15.0)
MCH: 23.9 pg — AB (ref 26.0–34.0)
MCHC: 31 g/dL (ref 30.0–36.0)
MCV: 77.3 fL — ABNORMAL LOW (ref 78.0–100.0)
PLATELETS: 249 10*3/uL (ref 150–400)
RBC: 4.89 MIL/uL (ref 3.87–5.11)
RDW: 15.4 % (ref 11.5–15.5)
WBC: 5 10*3/uL (ref 4.0–10.5)

## 2014-12-07 LAB — URINALYSIS, ROUTINE W REFLEX MICROSCOPIC
Bilirubin Urine: NEGATIVE
GLUCOSE, UA: NEGATIVE mg/dL
HGB URINE DIPSTICK: NEGATIVE
KETONES UR: NEGATIVE mg/dL
LEUKOCYTES UA: NEGATIVE
Nitrite: NEGATIVE
PROTEIN: NEGATIVE mg/dL
Specific Gravity, Urine: 1.025 (ref 1.005–1.030)
Urobilinogen, UA: 0.2 mg/dL (ref 0.0–1.0)
pH: 6 (ref 5.0–8.0)

## 2014-12-07 LAB — APTT: APTT: 36 s (ref 24–37)

## 2014-12-07 LAB — SURGICAL PCR SCREEN
MRSA, PCR: NEGATIVE
Staphylococcus aureus: NEGATIVE

## 2014-12-08 ENCOUNTER — Other Ambulatory Visit (HOSPITAL_COMMUNITY): Payer: Medicare Other

## 2014-12-08 NOTE — Progress Notes (Signed)
11/30/2014-Last office visit from Dr. Venetia Maxon -Cardiovascular Disease at [place] on chart. 01/22/2013-Myocardial Perfusion Report from [place] on chart. 06/28/2012-Echo-Doppler Report from [place] on chart.

## 2014-12-12 ENCOUNTER — Ambulatory Visit: Payer: Self-pay | Admitting: Orthopedic Surgery

## 2014-12-12 NOTE — H&P (Signed)
[name] DOB: 14-May-1940 Married / Language: English / Race: White Female Date of Admission:  12/13/2014 CC:  Right Knee Pain History of Present Illness The patient is a 74 year old female who comes in for a preoperative History and Physical. The patient is scheduled for a right unicompartmental knee replacement to be performed by Dr. Dione Plover. Aluisio, MD at [facility] on 12-13-2014. The patient is a 74 year old female who comes in today 11 weeks out from left unicompartmental knee arthroplasty. The patient states that she is doing well at this time. The pain is under excellent control at this time and describe their pain as mild. They are currently on Tylenol and Ultram for their pain. The patient is currently doing outpatient physical therapy (Pt completed last PT appt on Friday.). The patient feels that they are progressing well at this time. She is really pleased with how her left knee is doing. If anything, her right knee in the only thing that is really bothering her. She has documented medial compartment bone-on-bone arthritis to the right knee and has failed the injections. She is ready to proceed with the unicompartmental replacement on the right now. They have been treated conservatively in the past for the above stated problem and despite conservative measures, they continue to have progressive pain and severe functional limitations and dysfunction. They have failed non-operative management including home exercise, medications, and injections. It is felt that they would benefit from undergoing uni joint replacement. Risks and benefits of the procedure have been discussed with the patient and they elect to proceed with surgery. There are no active contraindications to surgery such as ongoing infection or rapidly progressive neurological disease.  Problem List/Past Medical Right knee pain (M25.561) Postoperative nausea (R11.0) Primary osteoarthritis of first carpometacarpal  joint of right hand (M18.11) Localized primary osteoarthritis of left lower leg (M17.12) Gastroesophageal Reflux Disease Osteoporosis Osteoarthritis Urinary Tract Infection Vertigo Anemia Tinnitus Asthma mild, slight Pneumonia Once Coronary Artery Disease/Heart Disease Hiatal Hernia Hypercholesterolemia Anxiety Disorder Diverticulitis Of Colon Once Myocardial infarction 3/07, 3/13, 4/15 Hypothyroidism Breast Cancer Avoid Left Arm, Right Arm is better Deep vein thrombosis Pulmonary Embolism Menopause Breast disease  Allergies Codeine/Codeine Derivatives wants to pass out Morphine Derivatives halluciantions and nausea Dilaudid *ANALGESICS - OPIOID* Nausea. Amiodarone HCl *ANTIARRHYTHMICS* Nausea, Vomiting. PredniSONE *CORTICOSTEROIDS* can't sleep Novocain *LOCAL ANESTHETICS-Parenteral* palpitations  Family History Cancer brother Congestive Heart Failure First Degree Relatives. mother Heart Disease mother, father and brother Hypertension father and brother Osteoarthritis First Degree Relatives. mother and brother Cerebrovascular Accident mother Heart disease in female family member before age 56 Diabetes Mellitus brother Bleeding disorder brother  Social History Advance Directives Living Will, Healthcare POA Exercise Exercises weekly; does other and gym / weights Children 0 Alcohol use never consumed alcohol Number of flights of stairs before winded 2-3 Illicit drug use no Living situation live with spouse Current work status retired Engineer, agricultural (Currently) no Pain Contract no Tobacco / smoke exposure no Marital status married Copy of Drug/Alcohol Rehab (Previously) no Tobacco use Never smoker. never smoker   Medication History Coumadin (5MG  Tablet, Oral) Active. Metoprolol Tartrate (25MG  Tablet, Oral) Active. Aspirin EC (81MG  Tablet DR, Oral) Active. Levothyroxine Sodium (75MCG Tablet, Oral)  Active. Ranitidine HCl (150MG  Tablet, Oral) Active. Diazepam (5MG  Tablet, Oral) Active. (prn) NexIUM (40MG  Packet, Oral as needed) Active. Nasonex (50MCG/ACT Suspension, Nasal) Active. Nitrostat (0.4MG  Tab Sublingual, Sublingual) Active. (prn) Meclizine HCl (25MG  Tablet, Oral) Active. (prn) Docusate Sodium (100MG  Capsule, Oral) Active. Pravastatin Sodium (  20MG  Tablet, Oral) Active. Magnesium Hydroxide (Oral) Specific dose unknown - Active. Allergra Active. Spironolactone Active.  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 Skin Not Present- Eczema, Hives, Itching, Lesions and Rash. HEENT Not Present- Dentures, Double Vision, Headache, Hearing Loss, Tinnitus and Visual Loss. Respiratory Not Present- Allergies, Chronic Cough, Coughing up blood, Shortness of breath at rest and Shortness of breath with exertion. Cardiovascular Not Present- Chest Pain, Difficulty Breathing Lying Down, Murmur, Palpitations, Racing/skipping heartbeats and Swelling. Gastrointestinal Not Present- Abdominal Pain, Bloody Stool, Constipation, Diarrhea, Difficulty Swallowing, Heartburn, Jaundice, Loss of appetitie, Nausea and Vomiting. Female Genitourinary Not Present- Blood in Urine, Discharge, Flank Pain, Incontinence, Painful Urination, Urgency, Urinary frequency, Urinary Retention, Urinating at Night and Weak urinary stream. Musculoskeletal Present- Joint Pain. Not Present- Back Pain, Joint Swelling, Morning Stiffness, Muscle Pain, Muscle Weakness and  Spasms. Neurological Not Present- Blackout spells, Difficulty with balance, Dizziness, Paralysis, Tremor and Weakness. Psychiatric Not Present- Insomnia.   Vitals  Weight: 152 lb Height: 65in Body Surface Area: 1.76 m Body Mass Index: 25.29 kg/m  BP: 98/56 (Sitting, Right Arm, Standard)   Physical Exam General Mental Status -Alert, cooperative and good historian. General Appearance-pleasant, Not in acute distress. Orientation-Oriented X3. Build & Nutrition-Well nourished and Well developed.  Head and Neck Head-normocephalic, atraumatic . Neck Global Assessment - supple, no bruit auscultated on the right, no bruit auscultated on the left.  Eye Vision-Wears corrective lenses. Pupil - Bilateral-Regular and Round. Motion - Bilateral-EOMI.  Chest and Lung Exam Auscultation Breath sounds - clear at anterior chest wall and clear at posterior chest wall. Adventitious sounds - No Adventitious sounds.  Cardiovascular Auscultation Rhythm - Regular rate and rhythm. Heart Sounds - S1 WNL and S2 WNL. Murmurs & Other Heart Sounds - Auscultation of the heart reveals - No Murmurs.  Abdomen Palpation/Percussion Tenderness - Abdomen is non-tender to palpation. Rigidity (guarding) - Abdomen is soft. Auscultation Auscultation of the abdomen reveals - Bowel sounds normal.  Female Genitourinary Note: Not done, not pertinent to present illness   Musculoskeletal Note: On exam, she is alert and oriented, no apparent distress. Her left knee looks fantastic. There is no swelling. Range 0 to 125 with no tenderness or instability. Her right knee shows no effusion, range about 5 to 125. Moderate crepitus on range of motion with some tenderness medially. There is no lateral tenderness. There is no instability noted.  RADIOGRAPHS We reviewed her radiographs. On the right one, she does have bone-on-bone in the medial compartment.   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 Alycia Rossetti  Topical TXA - Heart Disease, Breast Cancer, DVT, PE  Anesthesia Issues: None  Comments: Patient takes Coumadin which will be stopped 5 days prior to surgery and she will be placed on Lovenonx bridge preoperatively.  Signed electronically by Joelene Millin, III PA-C

## 2014-12-13 ENCOUNTER — Encounter (HOSPITAL_COMMUNITY): Payer: Self-pay | Admitting: *Deleted

## 2014-12-13 ENCOUNTER — Ambulatory Visit (HOSPITAL_COMMUNITY): Payer: Medicare Other | Admitting: Anesthesiology

## 2014-12-13 ENCOUNTER — Encounter (HOSPITAL_COMMUNITY)
Admission: RE | Disposition: A | Discharge status: Home / Self Care | Payer: Self-pay | Source: Ambulatory Visit | Attending: Orthopedic Surgery

## 2014-12-13 ENCOUNTER — Observation Stay (HOSPITAL_COMMUNITY)
Admission: RE | Admit: 2014-12-13 | Discharge: 2014-12-14 | Disposition: A | Discharge status: Home / Self Care | Payer: Medicare Other | Source: Ambulatory Visit | Attending: Orthopedic Surgery | Admitting: Orthopedic Surgery

## 2014-12-13 DIAGNOSIS — K219 Gastro-esophageal reflux disease without esophagitis: Secondary | ICD-10-CM | POA: Diagnosis not present

## 2014-12-13 DIAGNOSIS — I472 Ventricular tachycardia: Secondary | ICD-10-CM | POA: Diagnosis not present

## 2014-12-13 DIAGNOSIS — J45909 Unspecified asthma, uncomplicated: Secondary | ICD-10-CM | POA: Insufficient documentation

## 2014-12-13 DIAGNOSIS — I2510 Atherosclerotic heart disease of native coronary artery without angina pectoris: Secondary | ICD-10-CM | POA: Insufficient documentation

## 2014-12-13 DIAGNOSIS — I5181 Takotsubo syndrome: Secondary | ICD-10-CM | POA: Diagnosis not present

## 2014-12-13 DIAGNOSIS — E039 Hypothyroidism, unspecified: Secondary | ICD-10-CM | POA: Diagnosis not present

## 2014-12-13 DIAGNOSIS — Z885 Allergy status to narcotic agent status: Secondary | ICD-10-CM | POA: Insufficient documentation

## 2014-12-13 DIAGNOSIS — M25761 Osteophyte, right knee: Secondary | ICD-10-CM | POA: Diagnosis not present

## 2014-12-13 DIAGNOSIS — F329 Major depressive disorder, single episode, unspecified: Secondary | ICD-10-CM | POA: Insufficient documentation

## 2014-12-13 DIAGNOSIS — M1711 Unilateral primary osteoarthritis, right knee: Secondary | ICD-10-CM | POA: Diagnosis not present

## 2014-12-13 DIAGNOSIS — F419 Anxiety disorder, unspecified: Secondary | ICD-10-CM | POA: Diagnosis not present

## 2014-12-13 DIAGNOSIS — I255 Ischemic cardiomyopathy: Secondary | ICD-10-CM | POA: Diagnosis not present

## 2014-12-13 DIAGNOSIS — Z888 Allergy status to other drugs, medicaments and biological substances status: Secondary | ICD-10-CM | POA: Diagnosis not present

## 2014-12-13 DIAGNOSIS — Z9013 Acquired absence of bilateral breasts and nipples: Secondary | ICD-10-CM | POA: Diagnosis not present

## 2014-12-13 DIAGNOSIS — Z853 Personal history of malignant neoplasm of breast: Secondary | ICD-10-CM | POA: Insufficient documentation

## 2014-12-13 DIAGNOSIS — M179 Osteoarthritis of knee, unspecified: Secondary | ICD-10-CM | POA: Diagnosis present

## 2014-12-13 DIAGNOSIS — Z86718 Personal history of other venous thrombosis and embolism: Secondary | ICD-10-CM | POA: Insufficient documentation

## 2014-12-13 DIAGNOSIS — E785 Hyperlipidemia, unspecified: Secondary | ICD-10-CM | POA: Insufficient documentation

## 2014-12-13 DIAGNOSIS — I10 Essential (primary) hypertension: Secondary | ICD-10-CM | POA: Diagnosis not present

## 2014-12-13 DIAGNOSIS — I252 Old myocardial infarction: Secondary | ICD-10-CM | POA: Diagnosis not present

## 2014-12-13 DIAGNOSIS — M1710 Unilateral primary osteoarthritis, unspecified knee: Secondary | ICD-10-CM | POA: Diagnosis present

## 2014-12-13 DIAGNOSIS — Z86711 Personal history of pulmonary embolism: Secondary | ICD-10-CM | POA: Insufficient documentation

## 2014-12-13 DIAGNOSIS — R918 Other nonspecific abnormal finding of lung field: Secondary | ICD-10-CM | POA: Diagnosis not present

## 2014-12-13 HISTORY — PX: PARTIAL KNEE ARTHROPLASTY: SHX2174

## 2014-12-13 LAB — TYPE AND SCREEN
ABO/RH(D): B POS
Antibody Screen: NEGATIVE

## 2014-12-13 LAB — PROTIME-INR
INR: 1.08 (ref 0.00–1.49)
Prothrombin Time: 14.2 seconds (ref 11.6–15.2)

## 2014-12-13 SURGERY — ARTHROPLASTY, KNEE, UNICOMPARTMENTAL
Anesthesia: Spinal | Site: Knee | Laterality: Right

## 2014-12-13 MED ORDER — ACETAMINOPHEN 10 MG/ML IV SOLN
INTRAVENOUS | Status: AC
  Filled 2014-12-13: qty 100

## 2014-12-13 MED ORDER — FENTANYL CITRATE (PF) 250 MCG/5ML IJ SOLN
INTRAMUSCULAR | Status: AC
  Filled 2014-12-13: qty 25

## 2014-12-13 MED ORDER — TRANEXAMIC ACID 1000 MG/10ML IV SOLN
2000.0000 mg | INTRAVENOUS | Status: DC | PRN
  Administered 2014-12-13: 2000 mg via TOPICAL

## 2014-12-13 MED ORDER — ONDANSETRON HCL 4 MG/2ML IJ SOLN
INTRAMUSCULAR | Status: AC
  Filled 2014-12-13: qty 2

## 2014-12-13 MED ORDER — SODIUM CHLORIDE 0.9 % IJ SOLN
INTRAMUSCULAR | Status: AC
  Filled 2014-12-13: qty 50

## 2014-12-13 MED ORDER — FENTANYL CITRATE (PF) 100 MCG/2ML IJ SOLN
25.0000 ug | INTRAMUSCULAR | Status: DC | PRN
  Administered 2014-12-13: 25 ug via INTRAVENOUS

## 2014-12-13 MED ORDER — METOPROLOL TARTRATE 12.5 MG HALF TABLET
12.5000 mg | ORAL_TABLET | Freq: Two times a day (BID) | ORAL | Status: DC
  Administered 2014-12-13 – 2014-12-14 (×2): 12.5 mg via ORAL
  Filled 2014-12-13 (×3): qty 1

## 2014-12-13 MED ORDER — FLEET ENEMA 7-19 GM/118ML RE ENEM: 1.0000 | ENEMA | Freq: Once | RECTAL | Status: DC | PRN

## 2014-12-13 MED ORDER — MIDAZOLAM HCL 2 MG/2ML IJ SOLN
INTRAMUSCULAR | Status: AC
  Filled 2014-12-13: qty 4

## 2014-12-13 MED ORDER — MIDAZOLAM HCL 5 MG/5ML IJ SOLN
INTRAMUSCULAR | Status: DC | PRN
  Administered 2014-12-13 (×2): 1 mg via INTRAVENOUS

## 2014-12-13 MED ORDER — PROPOFOL 500 MG/50ML IV EMUL
INTRAVENOUS | Status: DC | PRN
  Administered 2014-12-13: 50 ug/kg/min via INTRAVENOUS

## 2014-12-13 MED ORDER — PANTOPRAZOLE SODIUM 40 MG PO TBEC: 80.0000 mg | DELAYED_RELEASE_TABLET | Freq: Every day | ORAL | Status: DC | PRN

## 2014-12-13 MED ORDER — BUPIVACAINE LIPOSOME 1.3 % IJ SUSP
20.0000 mL | Freq: Once | INTRAMUSCULAR | Status: DC
  Filled 2014-12-13: qty 20

## 2014-12-13 MED ORDER — PROPOFOL 10 MG/ML IV BOLUS
INTRAVENOUS | Status: AC
  Filled 2014-12-13: qty 20

## 2014-12-13 MED ORDER — BUPIVACAINE HCL (PF) 0.75 % IJ SOLN
INTRAMUSCULAR | Status: DC | PRN
  Administered 2014-12-13: 15 mg via INTRATHECAL

## 2014-12-13 MED ORDER — LEVOTHYROXINE SODIUM 75 MCG PO TABS
75.0000 ug | ORAL_TABLET | Freq: Every day | ORAL | Status: DC
  Administered 2014-12-14: 75 ug via ORAL
  Filled 2014-12-13 (×2): qty 1

## 2014-12-13 MED ORDER — CARBOXYMETHYLCELLULOSE SODIUM 0.5 % OP SOLN: 2.0000 [drp] | Freq: Two times a day (BID) | OPHTHALMIC | Status: DC | PRN

## 2014-12-13 MED ORDER — ESCITALOPRAM OXALATE 5 MG PO TABS
5.0000 mg | ORAL_TABLET | Freq: Every morning | ORAL | Status: DC
  Administered 2014-12-14: 5 mg via ORAL
  Filled 2014-12-13: qty 1

## 2014-12-13 MED ORDER — ACETAMINOPHEN 10 MG/ML IV SOLN
1000.0000 mg | Freq: Once | INTRAVENOUS | Status: AC
  Administered 2014-12-13: 1000 mg via INTRAVENOUS

## 2014-12-13 MED ORDER — FENTANYL CITRATE (PF) 100 MCG/2ML IJ SOLN
INTRAMUSCULAR | Status: DC | PRN
  Administered 2014-12-13 (×2): 50 ug via INTRAVENOUS

## 2014-12-13 MED ORDER — PRAVASTATIN SODIUM 40 MG PO TABS
40.0000 mg | ORAL_TABLET | Freq: Every day | ORAL | Status: DC
  Administered 2014-12-13 – 2014-12-14 (×2): 40 mg via ORAL
  Filled 2014-12-13 (×2): qty 1

## 2014-12-13 MED ORDER — POLYVINYL ALCOHOL 1.4 % OP SOLN
1.0000 [drp] | OPHTHALMIC | Status: DC | PRN
  Filled 2014-12-13: qty 15

## 2014-12-13 MED ORDER — FENTANYL CITRATE (PF) 100 MCG/2ML IJ SOLN
INTRAMUSCULAR | Status: AC
  Filled 2014-12-13: qty 2

## 2014-12-13 MED ORDER — ENOXAPARIN SODIUM 40 MG/0.4ML ~~LOC~~ SOLN
40.0000 mg | SUBCUTANEOUS | Status: DC
  Administered 2014-12-14: 40 mg via SUBCUTANEOUS
  Filled 2014-12-13 (×2): qty 0.4

## 2014-12-13 MED ORDER — LIDOCAINE HCL (CARDIAC) 20 MG/ML IV SOLN
INTRAVENOUS | Status: AC
  Filled 2014-12-13: qty 5

## 2014-12-13 MED ORDER — DOCUSATE SODIUM 100 MG PO CAPS: 200.0000 mg | ORAL_CAPSULE | Freq: Every evening | ORAL | Status: DC

## 2014-12-13 MED ORDER — BUPIVACAINE HCL (PF) 0.25 % IJ SOLN
INTRAMUSCULAR | Status: AC
  Filled 2014-12-13: qty 30

## 2014-12-13 MED ORDER — MAGNESIUM OXIDE 400 (241.3 MG) MG PO TABS
400.0000 mg | ORAL_TABLET | Freq: Every day | ORAL | Status: DC
  Filled 2014-12-13: qty 1

## 2014-12-13 MED ORDER — WARFARIN - PHARMACIST DOSING INPATIENT: Freq: Every day | Status: DC

## 2014-12-13 MED ORDER — ACETAMINOPHEN 325 MG PO TABS: 650.0000 mg | ORAL_TABLET | Freq: Four times a day (QID) | ORAL | Status: DC | PRN

## 2014-12-13 MED ORDER — ACETAMINOPHEN 650 MG RE SUPP: 650.0000 mg | Freq: Four times a day (QID) | RECTAL | Status: DC | PRN

## 2014-12-13 MED ORDER — DEXAMETHASONE SODIUM PHOSPHATE 10 MG/ML IJ SOLN: 10.0000 mg | Freq: Once | INTRAMUSCULAR | Status: DC

## 2014-12-13 MED ORDER — ONDANSETRON HCL 4 MG/2ML IJ SOLN
4.0000 mg | Freq: Four times a day (QID) | INTRAMUSCULAR | Status: DC | PRN
  Administered 2014-12-13 – 2014-12-14 (×2): 4 mg via INTRAVENOUS
  Filled 2014-12-13 (×2): qty 2

## 2014-12-13 MED ORDER — SODIUM CHLORIDE 0.9 % IV SOLN
INTRAVENOUS | Status: DC
  Administered 2014-12-13: 13:00:00 via INTRAVENOUS

## 2014-12-13 MED ORDER — DIAZEPAM 5 MG PO TABS: 5.0000 mg | ORAL_TABLET | Freq: Two times a day (BID) | ORAL | Status: DC | PRN

## 2014-12-13 MED ORDER — HYDROMORPHONE HCL 1 MG/ML IJ SOLN
0.5000 mg | INTRAMUSCULAR | Status: DC | PRN
  Administered 2014-12-13 – 2014-12-14 (×2): 0.5 mg via INTRAVENOUS
  Filled 2014-12-13 (×2): qty 1

## 2014-12-13 MED ORDER — METHOCARBAMOL 500 MG PO TABS
500.0000 mg | ORAL_TABLET | Freq: Four times a day (QID) | ORAL | Status: DC | PRN
  Administered 2014-12-13: 500 mg via ORAL
  Filled 2014-12-13: qty 1

## 2014-12-13 MED ORDER — PHENOL 1.4 % MT LIQD: 1.0000 | OROMUCOSAL | Status: DC | PRN

## 2014-12-13 MED ORDER — MENTHOL 3 MG MT LOZG: 1.0000 | LOZENGE | OROMUCOSAL | Status: DC | PRN

## 2014-12-13 MED ORDER — METOCLOPRAMIDE HCL 5 MG/ML IJ SOLN: 5.0000 mg | Freq: Three times a day (TID) | INTRAMUSCULAR | Status: DC | PRN

## 2014-12-13 MED ORDER — CEFAZOLIN SODIUM-DEXTROSE 2-3 GM-% IV SOLR
2.0000 g | INTRAVENOUS | Status: AC
  Administered 2014-12-13: 2 g via INTRAVENOUS

## 2014-12-13 MED ORDER — NITROGLYCERIN 0.4 MG SL SUBL: 0.4000 mg | SUBLINGUAL_TABLET | SUBLINGUAL | Status: DC | PRN

## 2014-12-13 MED ORDER — SALINE SPRAY 0.65 % NA SOLN: 2.0000 | NASAL | Status: DC | PRN

## 2014-12-13 MED ORDER — WARFARIN SODIUM 6 MG PO TABS
7.0000 mg | ORAL_TABLET | Freq: Once | ORAL | Status: AC
Start: 2014-12-13 — End: 2014-12-13
  Administered 2014-12-13: 7 mg via ORAL
  Filled 2014-12-13: qty 1

## 2014-12-13 MED ORDER — BUPIVACAINE HCL (PF) 0.25 % IJ SOLN
INTRAMUSCULAR | Status: DC | PRN
  Administered 2014-12-13: 20 mL

## 2014-12-13 MED ORDER — SODIUM CHLORIDE 0.9 % IV SOLN: INTRAVENOUS | Status: DC

## 2014-12-13 MED ORDER — BISACODYL 10 MG RE SUPP: 10.0000 mg | Freq: Every day | RECTAL | Status: DC | PRN

## 2014-12-13 MED ORDER — SPIRONOLACTONE 25 MG PO TABS
25.0000 mg | ORAL_TABLET | Freq: Every morning | ORAL | Status: DC
  Administered 2014-12-14: 25 mg via ORAL
  Filled 2014-12-13: qty 1

## 2014-12-13 MED ORDER — LACTATED RINGERS IV SOLN
INTRAVENOUS | Status: DC
  Administered 2014-12-13: 1000 mL via INTRAVENOUS

## 2014-12-13 MED ORDER — ONDANSETRON HCL 4 MG PO TABS: 4.0000 mg | ORAL_TABLET | Freq: Four times a day (QID) | ORAL | Status: DC | PRN

## 2014-12-13 MED ORDER — POLYETHYLENE GLYCOL 3350 17 G PO PACK: 17.0000 g | PACK | Freq: Every day | ORAL | Status: DC | PRN

## 2014-12-13 MED ORDER — TRANEXAMIC ACID 1000 MG/10ML IV SOLN
2000.0000 mg | Freq: Once | INTRAVENOUS | Status: DC
  Filled 2014-12-13: qty 20

## 2014-12-13 MED ORDER — SODIUM CHLORIDE 0.9 % IR SOLN
Status: DC | PRN
  Administered 2014-12-13: 1000 mL

## 2014-12-13 MED ORDER — LORATADINE 10 MG PO TABS
10.0000 mg | ORAL_TABLET | Freq: Every day | ORAL | Status: DC
  Administered 2014-12-14: 10 mg via ORAL
  Filled 2014-12-13: qty 1

## 2014-12-13 MED ORDER — DIPHENHYDRAMINE HCL 12.5 MG/5ML PO ELIX: 12.5000 mg | ORAL_SOLUTION | ORAL | Status: DC | PRN

## 2014-12-13 MED ORDER — DOCUSATE SODIUM 100 MG PO CAPS
100.0000 mg | ORAL_CAPSULE | Freq: Two times a day (BID) | ORAL | Status: DC
  Administered 2014-12-13 – 2014-12-14 (×2): 100 mg via ORAL

## 2014-12-13 MED ORDER — ONDANSETRON HCL 4 MG/2ML IJ SOLN
INTRAMUSCULAR | Status: DC | PRN
  Administered 2014-12-13: 4 mg via INTRAVENOUS

## 2014-12-13 MED ORDER — BUPIVACAINE LIPOSOME 1.3 % IJ SUSP
INTRAMUSCULAR | Status: DC | PRN
  Administered 2014-12-13: 20 mL

## 2014-12-13 MED ORDER — METHOCARBAMOL 1000 MG/10ML IJ SOLN
500.0000 mg | Freq: Four times a day (QID) | INTRAVENOUS | Status: DC | PRN
  Administered 2014-12-13: 500 mg via INTRAVENOUS
  Filled 2014-12-13 (×2): qty 5

## 2014-12-13 MED ORDER — METOCLOPRAMIDE HCL 10 MG PO TABS: 5.0000 mg | ORAL_TABLET | Freq: Three times a day (TID) | ORAL | Status: DC | PRN

## 2014-12-13 MED ORDER — SODIUM CHLORIDE 0.9 % IJ SOLN
INTRAMUSCULAR | Status: DC | PRN
  Administered 2014-12-13: 30 mL

## 2014-12-13 MED ORDER — ACETAMINOPHEN 500 MG PO TABS
1000.0000 mg | ORAL_TABLET | Freq: Four times a day (QID) | ORAL | Status: AC
  Administered 2014-12-13 – 2014-12-14 (×4): 1000 mg via ORAL
  Filled 2014-12-13 (×4): qty 2

## 2014-12-13 MED ORDER — CEFAZOLIN SODIUM-DEXTROSE 2-3 GM-% IV SOLR
INTRAVENOUS | Status: AC
  Filled 2014-12-13: qty 50

## 2014-12-13 MED ORDER — TRAMADOL HCL 50 MG PO TABS
50.0000 mg | ORAL_TABLET | Freq: Four times a day (QID) | ORAL | Status: DC | PRN
  Administered 2014-12-13 – 2014-12-14 (×4): 100 mg via ORAL
  Filled 2014-12-13 (×4): qty 2

## 2014-12-13 MED ORDER — CHLORHEXIDINE GLUCONATE 4 % EX LIQD: 60.0000 mL | Freq: Once | CUTANEOUS | Status: DC

## 2014-12-13 MED ORDER — CEFAZOLIN SODIUM-DEXTROSE 2-3 GM-% IV SOLR
2.0000 g | Freq: Four times a day (QID) | INTRAVENOUS | Status: AC
  Administered 2014-12-13 (×2): 2 g via INTRAVENOUS
  Filled 2014-12-13 (×2): qty 50

## 2014-12-13 MED ORDER — MECLIZINE HCL 25 MG PO TABS
25.0000 mg | ORAL_TABLET | Freq: Two times a day (BID) | ORAL | Status: DC | PRN
  Filled 2014-12-13: qty 1

## 2014-12-13 MED ORDER — FAMOTIDINE 20 MG PO TABS
20.0000 mg | ORAL_TABLET | Freq: Every day | ORAL | Status: DC
  Administered 2014-12-13: 20 mg via ORAL
  Filled 2014-12-13 (×2): qty 1

## 2014-12-13 SURGICAL SUPPLY — 58 items
BAG DECANTER FOR FLEXI CONT (MISCELLANEOUS) ×3 IMPLANT
BAG SPEC THK2 15X12 ZIP CLS (MISCELLANEOUS) ×1
BAG ZIPLOCK 12X15 (MISCELLANEOUS) ×2 IMPLANT
BANDAGE ELASTIC 6 VELCRO ST LF (GAUZE/BANDAGES/DRESSINGS) ×3 IMPLANT
BANDAGE ESMARK 6X9 LF (GAUZE/BANDAGES/DRESSINGS) ×1 IMPLANT
BLADE SAW RECIPROCATING 77.5 (BLADE) ×3 IMPLANT
BLADE SAW SGTL 13.0X1.19X90.0M (BLADE) ×3 IMPLANT
BNDG CMPR 9X6 STRL LF SNTH (GAUZE/BANDAGES/DRESSINGS) ×1
BNDG ESMARK 6X9 LF (GAUZE/BANDAGES/DRESSINGS) ×3
BOWL SMART MIX CTS (DISPOSABLE) ×3 IMPLANT
CAPT KNEE PARTIAL 2 ×3 IMPLANT
CEMENT HV SMART SET (Cement) ×3 IMPLANT
CLOSURE WOUND 1/2 X4 (GAUZE/BANDAGES/DRESSINGS) ×1
CUFF TOURN SGL QUICK 34 (TOURNIQUET CUFF) ×3
CUFF TRNQT CYL 34X4X40X1 (TOURNIQUET CUFF) ×1 IMPLANT
DRAPE EXTREMITY T 121X128X90 (DRAPE) ×3 IMPLANT
DRAPE POUCH INSTRU U-SHP 10X18 (DRAPES) ×3 IMPLANT
DRSG ADAPTIC 3X8 NADH LF (GAUZE/BANDAGES/DRESSINGS) ×3 IMPLANT
DRSG PAD ABDOMINAL 8X10 ST (GAUZE/BANDAGES/DRESSINGS) ×1 IMPLANT
DURAPREP 26ML APPLICATOR (WOUND CARE) ×3 IMPLANT
ELECT REM PT RETURN 9FT ADLT (ELECTROSURGICAL) ×3
ELECTRODE REM PT RTRN 9FT ADLT (ELECTROSURGICAL) ×1 IMPLANT
EVACUATOR 1/8 PVC DRAIN (DRAIN) ×3 IMPLANT
FACESHIELD WRAPAROUND (MASK) ×15 IMPLANT
FACESHIELD WRAPAROUND OR TEAM (MASK) ×5 IMPLANT
GAUZE SPONGE 4X4 12PLY STRL (GAUZE/BANDAGES/DRESSINGS) ×3 IMPLANT
GLOVE BIO SURGEON STRL SZ7.5 (GLOVE) ×3 IMPLANT
GLOVE BIO SURGEON STRL SZ8 (GLOVE) ×3 IMPLANT
GLOVE BIOGEL PI IND STRL 8 (GLOVE) ×2 IMPLANT
GLOVE BIOGEL PI INDICATOR 8 (GLOVE) ×4
GOWN STRL REUS W/TWL LRG LVL3 (GOWN DISPOSABLE) ×3 IMPLANT
GOWN STRL REUS W/TWL XL LVL3 (GOWN DISPOSABLE) ×3 IMPLANT
HANDPIECE INTERPULSE COAX TIP (DISPOSABLE) ×3
IMMOBILIZER KNEE 20 (SOFTGOODS) ×3
IMMOBILIZER KNEE 20 THIGH 36 (SOFTGOODS) ×1 IMPLANT
KIT BASIN OR (CUSTOM PROCEDURE TRAY) ×3 IMPLANT
KIT IMPL STRL TIB IPOLY IUNI IMPLANT
MANIFOLD NEPTUNE II (INSTRUMENTS) ×3 IMPLANT
NDL SAFETY ECLIPSE 18X1.5 (NEEDLE) ×2 IMPLANT
NEEDLE HYPO 18GX1.5 SHARP (NEEDLE) ×6
PACK TOTAL JOINT (CUSTOM PROCEDURE TRAY) ×3 IMPLANT
PAD ABD 8X10 STRL (GAUZE/BANDAGES/DRESSINGS) ×2 IMPLANT
PADDING CAST COTTON 6X4 STRL (CAST SUPPLIES) ×4 IMPLANT
POSITIONER SURGICAL ARM (MISCELLANEOUS) ×3 IMPLANT
SET HNDPC FAN SPRY TIP SCT (DISPOSABLE) ×1 IMPLANT
STRIP CLOSURE SKIN 1/2X4 (GAUZE/BANDAGES/DRESSINGS) ×3 IMPLANT
SUCTION FRAZIER 12FR DISP (SUCTIONS) ×3 IMPLANT
SUT MNCRL AB 4-0 PS2 18 (SUTURE) ×3 IMPLANT
SUT VIC AB 2-0 CT1 27 (SUTURE) ×6
SUT VIC AB 2-0 CT1 TAPERPNT 27 (SUTURE) ×2 IMPLANT
SUT VLOC 180 0 24IN GS25 (SUTURE) ×3 IMPLANT
SYR 20CC LL (SYRINGE) ×3 IMPLANT
SYR 50ML LL SCALE MARK (SYRINGE) ×3 IMPLANT
TOWEL OR 17X26 10 PK STRL BLUE (TOWEL DISPOSABLE) ×3 IMPLANT
TOWEL OR NON WOVEN STRL DISP B (DISPOSABLE) ×3 IMPLANT
TRAY FOLEY W/METER SILVER 14FR (SET/KITS/TRAYS/PACK) ×1 IMPLANT
TRAY FOLEY W/METER SILVER 16FR (SET/KITS/TRAYS/PACK) ×1 IMPLANT
WRAP KNEE MAXI GEL POST OP (GAUZE/BANDAGES/DRESSINGS) ×2 IMPLANT

## 2014-12-13 NOTE — Anesthesia Procedure Notes (Addendum)
Spinal Patient location during procedure: OR Start time: 12/13/2014 10:01 AM End time: 12/13/2014 10:06 AM Staffing Anesthesiologist: Rod Mae Performed by: anesthesiologist  Preanesthetic Checklist Completed: patient identified, site marked, surgical consent, pre-op evaluation, timeout performed, IV checked, risks and benefits discussed and monitors and equipment checked Spinal Block Patient position: sitting Prep: Betadine Patient monitoring: heart rate, continuous pulse ox and blood pressure Location: L3-4 Injection technique: single-shot Needle Needle type: Spinocan  Needle gauge: 22 G Needle length: 9 cm Assessment Sensory level: T6 Additional Notes Expiration date of kit checked and confirmed. Patient tolerated procedure well, without complications.    Procedure Name: MAC Date/Time: 12/13/2014 10:11 AM Performed by: Kyung Rudd Pre-anesthesia Checklist: Patient identified, Emergency Drugs available, Suction available, Patient being monitored and Timeout performed Patient Re-evaluated:Patient Re-evaluated prior to inductionOxygen Delivery Method: Simple face mask Intubation Type: IV induction Placement Confirmation: positive ETCO2

## 2014-12-13 NOTE — Evaluation (Signed)
Physical Therapy Evaluation Patient Details Name: [name] MRN: [id number] DOB: [birth year] Today's Date: 12/13/2014   History of Present Illness  R UKR  Clinical Impression  Patient tolerated well except for incontinence related to  Remaining decreased sensation  Post spinal. Patient will benefit from PT to address problems listed in the note below.    Follow Up Recommendations Home health PT;Supervision/Assistance - 24 hour    Equipment Recommendations  Rolling walker with 5" wheels (patient requests a RW with larger wheels than hers)    Recommendations for Other Services       Precautions / Restrictions Precautions Precautions: Knee Precaution Comments: incontinent of urine due to  spinal Required Braces or Orthoses: Knee Immobilizer - Right Knee Immobilizer - Right: Discontinue once straight leg raise with < 10 degree lag      Mobility  Bed Mobility Overal bed mobility: Needs Assistance Bed Mobility: Supine to Sit;Sit to Supine     Supine to sit: Min assist Sit to supine: Min assist   General bed mobility comments: able to manage R leg  Transfers Overall transfer level: Needs assistance Equipment used: Rolling walker (2 wheeled) Transfers: Sit to/from [facility] Sit to Stand: Mod assist;+2 physical assistance;+2 safety/equipment Stand pivot transfers: Mod assist;+2 physical assistance;+2 safety/equipment       General transfer comment: patient incontinent of urine upon sitting up to the edge of the bed and during the transfer to the [facility]  Ambulation/Gait                Stairs            Wheelchair Mobility    Modified Rankin (Stroke Patients Only)       Balance Overall balance assessment: Needs assistance Sitting-balance support: Feet supported;No upper extremity supported Sitting balance-Leahy Scale: Fair     Standing balance support: During functional activity;Bilateral upper extremity supported Standing  balance-Leahy Scale: Fair                               Pertinent Vitals/Pain Pain Assessment: 0-10 Pain Score: 4  Pain Location: R knee Pain Descriptors / Indicators: Aching Pain Intervention(s): Limited activity within patient's tolerance;Monitored during session;Repositioned;Premedicated before session;Ice applied    Home Living Family/patient expects to be discharged to:: Private residence Living Arrangements: Spouse/significant other Available Help at Discharge: Family Type of Home: House Home Access: Stairs to enter Entrance Stairs-Rails: None Technical brewer of Steps: 1 Home Layout: One level Home Equipment: Environmental consultant - 2 wheels      Prior Function Level of Independence: Independent               Hand Dominance        Extremity/Trunk Assessment   Upper Extremity Assessment: Overall WFL for tasks assessed           Lower Extremity Assessment: RLE deficits/detail RLE Deficits / Details: able to SLR, knee flexion 50       Communication   Communication: No difficulties  Cognition Arousal/Alertness: Awake/alert Behavior During Therapy: WFL for tasks assessed/performed Overall Cognitive Status: Within Functional Limits for tasks assessed                      General Comments      Exercises        Assessment/Plan    PT Assessment Patient needs continued PT services  PT Diagnosis Difficulty walking;Acute pain   PT Problem List Decreased  strength;Decreased range of motion;Decreased activity tolerance;Decreased mobility;Impaired sensation;Decreased knowledge of precautions;Decreased safety awareness;Decreased knowledge of use of DME;Pain  PT Treatment Interventions DME instruction;Gait training;Stair training;Functional mobility training;Therapeutic activities;Therapeutic exercise;Patient/family education   PT Goals (Current goals can be found in the Care Plan section) Acute Rehab PT Goals Patient Stated Goal: to get  control of my urine PT Goal Formulation: With patient/family Time For Goal Achievement: 12/27/14 Potential to Achieve Goals: Good    Frequency 7X/week   Barriers to discharge        Co-evaluation               End of Session Equipment Utilized During Treatment:  (took KI off due to incontinence) Activity Tolerance: Patient tolerated treatment well Patient left: in bed;with call bell/phone within reach;with bed alarm set;with family/visitor present Nurse Communication: Mobility status    Functional Assessment Tool Used: clinical observation Functional Limitation: Mobility: Walking and moving around Mobility: Walking and Moving Around Current Status [phone]): At least 40 percent but less than 60 percent impaired, limited or restricted Mobility: Walking and Moving Around Goal Status [phone]): At least 1 percent but less than 20 percent impaired, limited or restricted    Time: 9311-2162 PT Time Calculation (min) (ACUTE ONLY): 29 min   Charges:   PT Evaluation $Initial PT Evaluation Tier I: 1 Procedure PT Treatments $Therapeutic Activity: 8-22 mins   PT G Codes:   PT G-Codes **NOT FOR INPATIENT CLASS** Functional Assessment Tool Used: clinical observation Functional Limitation: Mobility: Walking and moving around Mobility: Walking and Moving Around Current Status (O4695): At least 40 percent but less than 60 percent impaired, limited or restricted Mobility: Walking and Moving Around Goal Status [phone]): At least 1 percent but less than 20 percent impaired, limited or restricted    Claretha Cooper 12/13/2014, 5:23 PM

## 2014-12-13 NOTE — Progress Notes (Signed)
ANTICOAGULATION CONSULT NOTE - Initial Consult  Pharmacy Consult for warfarin Indication: VTE prophylaxis  Allergies  Allergen Reactions  . Hydromorphone Nausea Only  . Amiodarone Nausea And Vomiting  . Codeine Nausea And Vomiting  . Hydrocodone Nausea And Vomiting  . Morphine Nausea And Vomiting  . Prednisone     Keeps her awake  . Statins Other (See Comments)    Leg cramps" Lipitor only"  . Procaine Hcl Palpitations    Able to handle lidocaine for IV start without any complications    Patient Measurements: Height: 5\' 6"  (167.6 cm) Weight: 158 lb (71.668 kg) IBW/kg (Calculated) : 59.3 Heparin Dosing Weight:   Vital Signs: Temp: 97.7 F (36.5 C) (10/24 1232) BP: 140/76 mmHg (10/24 1232) Pulse Rate: 57 (10/24 1232)  Labs:  Recent Labs  12/13/14 0740  LABPROT 14.2  INR 1.08    Estimated Creatinine Clearance: 50.9 mL/min (by C-G formula based on Cr of 1).   Medical History: Past Medical History  Diagnosis Date  . Allergic rhinitis   . Breast cancer ([place]) 1999, 2008    s/p bilat mastectomy  . Pulmonary nodules     no change CT chest 3/10  . GERD (gastroesophageal reflux disease)   . HTN (hypertension)   . Hyperlipidemia   . NSVT (nonsustained ventricular tachycardia) ([place])     a. 05/2013 after MI->amio caused nausea->BB only.  . Hypothyroid   . Ischemic cardiomyopathy     a. 05/2013 Echo: EF 40-45%, midinferior AK.  . Takotsubo cardiomyopathy     a. 2013 with subsequent recovery of LV fxn (followed by MI in 05/2013 with LV dysfxn).  [place] Kitchen CAD (coronary artery disease)     a. H/o nonobs dzs and vasospasm with takotsubo CM;  b. 05/2013 Inf STEMI/Cath: LM nl, LAD nl, LCX nondom 100d (failed PCI), RCA dominant, nl, RPDA 103m (41mm vessel-->Med Rx), EF 35-40%.  . Coronary vasospasm ([place])     some chest pain "last chest pressure episode 12'15 -evaluated at [facility], multiple testing-ruleout MI, "thought anxiety"  . DVT of popliteal vein ([facility])     left knee- caused bilateral  Pulmonary emboli" Coumadin"  . Depression   . Anxiety   . Pulmonary emboli ([place])     history of dx. 07-05-13 [facility]  . Myocardial infarction ([place]) 05/2013  . Arthritis   . Dysrhythmia     NSVT    Medications:  Scheduled:  . acetaminophen  1,000 mg Oral 4 times per day  .  ceFAZolin (ANCEF) IV  2 g Intravenous Q6H  . docusate sodium  100 mg Oral BID  . [START ON 12/14/2014] enoxaparin  40 mg Subcutaneous Q24H  . [START ON 12/14/2014] escitalopram  5 mg Oral q morning - 10a  . famotidine  20 mg Oral QHS  . [START ON 12/14/2014] levothyroxine  75 mcg Oral QAC breakfast  . [START ON 12/14/2014] loratadine  10 mg Oral Daily  . [START ON 12/14/2014] magnesium oxide  400 mg Oral QPC lunch  . metoprolol tartrate  12.5 mg Oral BID  . pravastatin  40 mg Oral Daily  . [START ON 12/14/2014] spironolactone  25 mg Oral q morning - 10a  . warfarin  7 mg Oral ONCE-1800  . Warfarin - Pharmacist Dosing Inpatient   Does not apply q1800    Assessment: Pt is a 74 yo female s/p right knee medial unicompartmental arthroplasty. Pt is on warfarin PTA for bilateral PE. Pt's home regimen has been warfarin 4.5 mg PO daily. Pt  has been therapeutic on this regimen as outpt. Medication has been held since 10/19 and pt has been bridged with enoxaparin 40 mg [place] daily. Baseline INR is 1.08. Pt has also been ordered enoxaparin 40 mg [place] daily starting on 10/25.   Goal of Therapy:  INR 2-3 Monitor platelets by anticoagulation protocol: Yes   Plan:  Will start warfarin at 1.5x home dose, therefore warfarin 7 mg PO x 1  Daily INR Daily CBC  Royetta Asal, PharmD, BCPS Pager [phone]  12/13/2014,12:56 PM

## 2014-12-13 NOTE — H&P (View-Only) (Signed)
[name] DOB: Jan 07, 1941 Married / Language: English / Race: White Female Date of Admission:  12/13/2014 CC:  Right Knee Pain History of Present Illness The patient is a 74 year old female who comes in for a preoperative History and Physical. The patient is scheduled for a right unicompartmental knee replacement to be performed by Dr. Dione Plover. Aluisio, MD at [facility] on 12-13-2014. The patient is a 74 year old female who comes in today 11 weeks out from left unicompartmental knee arthroplasty. The patient states that she is doing well at this time. The pain is under excellent control at this time and describe their pain as mild. They are currently on Tylenol and Ultram for their pain. The patient is currently doing outpatient physical therapy (Pt completed last PT appt on Friday.). The patient feels that they are progressing well at this time. She is really pleased with how her left knee is doing. If anything, her right knee in the only thing that is really bothering her. She has documented medial compartment bone-on-bone arthritis to the right knee and has failed the injections. She is ready to proceed with the unicompartmental replacement on the right now. They have been treated conservatively in the past for the above stated problem and despite conservative measures, they continue to have progressive pain and severe functional limitations and dysfunction. They have failed non-operative management including home exercise, medications, and injections. It is felt that they would benefit from undergoing uni joint replacement. Risks and benefits of the procedure have been discussed with the patient and they elect to proceed with surgery. There are no active contraindications to surgery such as ongoing infection or rapidly progressive neurological disease.  Problem List/Past Medical Right knee pain (M25.561) Postoperative nausea (R11.0) Primary osteoarthritis of first carpometacarpal  joint of right hand (M18.11) Localized primary osteoarthritis of left lower leg (M17.12) Gastroesophageal Reflux Disease Osteoporosis Osteoarthritis Urinary Tract Infection Vertigo Anemia Tinnitus Asthma mild, slight Pneumonia Once Coronary Artery Disease/Heart Disease Hiatal Hernia Hypercholesterolemia Anxiety Disorder Diverticulitis Of Colon Once Myocardial infarction 3/07, 3/13, 4/15 Hypothyroidism Breast Cancer Avoid Left Arm, Right Arm is better Deep vein thrombosis Pulmonary Embolism Menopause Breast disease  Allergies Codeine/Codeine Derivatives wants to pass out Morphine Derivatives halluciantions and nausea Dilaudid *ANALGESICS - OPIOID* Nausea. Amiodarone HCl *ANTIARRHYTHMICS* Nausea, Vomiting. PredniSONE *CORTICOSTEROIDS* can't sleep Novocain *LOCAL ANESTHETICS-Parenteral* palpitations  Family History Cancer brother Congestive Heart Failure First Degree Relatives. mother Heart Disease mother, father and brother Hypertension father and brother Osteoarthritis First Degree Relatives. mother and brother Cerebrovascular Accident mother Heart disease in female family member before age 17 Diabetes Mellitus brother Bleeding disorder brother  Social History Advance Directives Living Will, Healthcare POA Exercise Exercises weekly; does other and gym / weights Children 0 Alcohol use never consumed alcohol Number of flights of stairs before winded 2-3 Illicit drug use no Living situation live with spouse Current work status retired Engineer, agricultural (Currently) no Pain Contract no Tobacco / smoke exposure no Marital status married Copy of Drug/Alcohol Rehab (Previously) no Tobacco use Never smoker. never smoker   Medication History Coumadin (5MG  Tablet, Oral) Active. Metoprolol Tartrate (25MG  Tablet, Oral) Active. Aspirin EC (81MG  Tablet DR, Oral) Active. Levothyroxine Sodium (75MCG Tablet, Oral)  Active. Ranitidine HCl (150MG  Tablet, Oral) Active. Diazepam (5MG  Tablet, Oral) Active. (prn) NexIUM (40MG  Packet, Oral as needed) Active. Nasonex (50MCG/ACT Suspension, Nasal) Active. Nitrostat (0.4MG  Tab Sublingual, Sublingual) Active. (prn) Meclizine HCl (25MG  Tablet, Oral) Active. (prn) Docusate Sodium (100MG  Capsule, Oral) Active. Pravastatin Sodium (  20MG  Tablet, Oral) Active. Magnesium Hydroxide (Oral) Specific dose unknown - Active. Allergra Active. Spironolactone Active.  Past Surgical History Breast cancer (174.9) 1999 left/2008 right Breast Mass; Local Excision bilateral Cataract Surgery bilateral Straighten Nasal Septum Breast Biopsy bilateral, multiple times Tonsillectomy Mastectomy bilateral Thyroidectomy; Subtotal Sinus Surgery S/P arthroscopy of knee (V45.89) Status post unicompartmental knee replacement, left (U82.800)  Review of Systems General Not Present- Chills, Fatigue, Fever, Memory Loss, Night Sweats, Weight Gain and Weight Loss. Skin Not Present- Eczema, Hives, Itching, Lesions and Rash. HEENT Not Present- Dentures, Double Vision, Headache, Hearing Loss, Tinnitus and Visual Loss. Respiratory Not Present- Allergies, Chronic Cough, Coughing up blood, Shortness of breath at rest and Shortness of breath with exertion. Cardiovascular Not Present- Chest Pain, Difficulty Breathing Lying Down, Murmur, Palpitations, Racing/skipping heartbeats and Swelling. Gastrointestinal Not Present- Abdominal Pain, Bloody Stool, Constipation, Diarrhea, Difficulty Swallowing, Heartburn, Jaundice, Loss of appetitie, Nausea and Vomiting. Female Genitourinary Not Present- Blood in Urine, Discharge, Flank Pain, Incontinence, Painful Urination, Urgency, Urinary frequency, Urinary Retention, Urinating at Night and Weak urinary stream. Musculoskeletal Present- Joint Pain. Not Present- Back Pain, Joint Swelling, Morning Stiffness, Muscle Pain, Muscle Weakness and  Spasms. Neurological Not Present- Blackout spells, Difficulty with balance, Dizziness, Paralysis, Tremor and Weakness. Psychiatric Not Present- Insomnia.   Vitals  Weight: 152 lb Height: 65in Body Surface Area: 1.76 m Body Mass Index: 25.29 kg/m  BP: 98/56 (Sitting, Right Arm, Standard)   Physical Exam General Mental Status -Alert, cooperative and good historian. General Appearance-pleasant, Not in acute distress. Orientation-Oriented X3. Build & Nutrition-Well nourished and Well developed.  Head and Neck Head-normocephalic, atraumatic . Neck Global Assessment - supple, no bruit auscultated on the right, no bruit auscultated on the left.  Eye Vision-Wears corrective lenses. Pupil - Bilateral-Regular and Round. Motion - Bilateral-EOMI.  Chest and Lung Exam Auscultation Breath sounds - clear at anterior chest wall and clear at posterior chest wall. Adventitious sounds - No Adventitious sounds.  Cardiovascular Auscultation Rhythm - Regular rate and rhythm. Heart Sounds - S1 WNL and S2 WNL. Murmurs & Other Heart Sounds - Auscultation of the heart reveals - No Murmurs.  Abdomen Palpation/Percussion Tenderness - Abdomen is non-tender to palpation. Rigidity (guarding) - Abdomen is soft. Auscultation Auscultation of the abdomen reveals - Bowel sounds normal.  Female Genitourinary Note: Not done, not pertinent to present illness   Musculoskeletal Note: On exam, she is alert and oriented, no apparent distress. Her left knee looks fantastic. There is no swelling. Range 0 to 125 with no tenderness or instability. Her right knee shows no effusion, range about 5 to 125. Moderate crepitus on range of motion with some tenderness medially. There is no lateral tenderness. There is no instability noted.  RADIOGRAPHS We reviewed her radiographs. On the right one, she does have bone-on-bone in the medial compartment.   Assessment & Plan  Primary  osteoarthritis of right knee (M17.11) Current Plans Started Lovenox 40MG /0.4ML, 1 (one) Solution daily subcutaneous injection for four days (12/09/2014 - 12/12/2014),  Status post unicompartmental knee replacement, left (L49.179)  Note:Surgical Plans: Right Unicompartmental Knee Replacement  Disposition: Home  PCP: Dr. Alycia Rossetti  Topical TXA - Heart Disease, Breast Cancer, DVT, PE  Anesthesia Issues: None  Comments: Patient takes Coumadin which will be stopped 5 days prior to surgery and she will be placed on Lovenonx bridge preoperatively.  Signed electronically by Joelene Millin, III PA-C

## 2014-12-13 NOTE — Anesthesia Postprocedure Evaluation (Signed)
  Anesthesia Post-op Note  Patient: [name]  Procedure(s) Performed: Procedure(s) (LRB): RIGHT KNEE UNICOMPARTMENTAL ARTHROPLASTY (Right)  Patient Location: PACU  Anesthesia Type: Spinal  Level of Consciousness: awake and alert   Airway and Oxygen Therapy: Patient Spontanous Breathing  Post-op Pain: mild  Post-op Assessment: Post-op Vital signs reviewed, Patient's Cardiovascular Status Stable, Respiratory Function Stable, Patent Airway and No signs of Nausea or vomiting  Last Vitals:  Filed Vitals:   12/13/14 1232  BP: 140/76  Pulse: 57  Temp: 36.5 C  Resp:     Post-op Vital Signs: stable   Complications: No apparent anesthesia complications

## 2014-12-13 NOTE — Anesthesia Preprocedure Evaluation (Addendum)
Anesthesia Evaluation  Patient identified by MRN, date of birth, ID band Patient awake    Reviewed: Allergy & Precautions, NPO status , Patient's Chart, lab work & pertinent test results, reviewed documented beta blocker date and time   Airway Mallampati: III  TM Distance: >3 FB Neck ROM: Full    Dental no notable dental hx. (+) Dental Advisory Given, Teeth Intact   Pulmonary shortness of breath, asthma , PE Bilateral PE 5/17   Pulmonary exam normal breath sounds clear to auscultation       Cardiovascular hypertension, Pt. on medications and Pt. on home beta blockers + CAD, + Past MI and + Peripheral Vascular Disease  Normal cardiovascular exam+ dysrhythmias Ventricular Tachycardia  Rhythm:Regular Rate:Normal  Echo 05/2013 Left ventricle: Systolic function was mildly to moderately reduced. The estimated ejection fraction was in the range of 40% to 45%. Akinesis of the midinferior myocardium; consistent with infarction. There was an increased relative contribution of atrial contraction to ventricular filling.      Non-obstructive CAD with vasospasm - MI 4/15 inf. STEMI    Neuro/Psych PSYCHIATRIC DISORDERS Anxiety Depression negative neurological ROS     GI/Hepatic Neg liver ROS, GERD  ,  Endo/Other  negative endocrine ROSHypothyroidism   Renal/GU negative Renal ROS     Musculoskeletal  (+) Arthritis ,   Abdominal   Peds  Hematology negative hematology ROS (+)   Anesthesia Other Findings   Reproductive/Obstetrics negative OB ROS                            Anesthesia Physical Anesthesia Plan  ASA: III  Anesthesia Plan: Spinal   Post-op Pain Management:    Induction:   Airway Management Planned:   Additional Equipment:   Intra-op Plan:   Post-operative Plan:   Informed Consent:   Plan Discussed with: Surgeon  Anesthesia Plan Comments:         Anesthesia Quick  Evaluation

## 2014-12-13 NOTE — Op Note (Signed)
OPERATIVE REPORT  PREOPERATIVE DIAGNOSIS: Medial compartment osteoarthritis, Right knee  POSTOPERATIVE DIAGNOSIS: Medial compartment osteoarthritis, Right knee  PROCEDURE:Right knee medial unicompartmental arthroplasty.   SURGEON: Gaynelle Arabian, MD   ASSISTANT: Arlee Muslim, PA-C  ANESTHESIA:  Spinal.   ESTIMATED BLOOD LOSS: Minimal.   DRAINS: Hemovac x1.   TOURNIQUET TIME: 32 minutes at 300 mmHg.   COMPLICATIONS: None.   CONDITION: Stable to recovery.   BRIEF CLINICAL NOTE:[name] is a 74 y.o. female, who has  significant isolated medial compartment arthritis of the Right knee. She has had nonoperative management including injections. She has had  cortisone and viscous supplements. Unfortunately, the pain persists.  Radiograph showed isolated medial compartment bone-on-bone arthritis  with normal-appearing patellofemoral and lateral compartments. She  presents now for right knee unicompartmental arthroplasty.   PROCEDURE IN DETAIL: After successful administration of  Spinal anesthetic, a tourniquet was placed high on the  Right thigh and left lower extremity prepped and draped in usual sterile fashion. Extremity was wrapped in an Esmarch, knee flexed, and tourniquet inflated to 300 mmHg. A midline incision was made with a 10 blade through subcutaneous  tissue to the extensor mechanism. A fresh blade was used to make a  medial parapatellar arthrotomy. Soft tissue on the proximal medial  tibia subperiosteally elevated to the joint line with a knife and into  the semimembranosus bursa with a Cobb elevator. The patella was  subluxed laterally, and the knee flexed 90 degrees. The ACL was intact.  The marginal osteophytes on the medial femur and tibia were removed with  a rongeur. The medial meniscus was also removed. The femoral cutting  block where the conformis unicompartmental knee system was placed along  the femur. There was excellent fit. I traced  the outline. We then  removed any remaining cartilage within this outline. We then placed the  cutting block again and pinned in position. The posterior femoral cut  was made, it was approximately 5 mm. The lug holes for the femoral  component were then drilled through the cutting block. The cutting  block was subsequently removed. We then utilized the high speed burr to  create a small trough at the superior aspect of the components that of  the inset and would not overhang the cartilage. The trial was placed,  it had excellent fit. The trial was subsequently removed.       The trial was placed again and the B chip was placed. There was  excellent balance throughout full motion. Also with excellent fit on  her tibia. This was removed as was the femoral trial. A curette was  used to remove any remaining cartilage from the tibia. The tibial  cutting block was then placed and there was a perfect fit on the tibial  surface. The appropriate slope was placed and it was pinned in  position. The reciprocating saw was used to make the central cut and  then the oscillating saw used to make the horizontal cut. The bone  fragment was then removed. The tibial trial was placed and had perfect  fit on the tibia. We then drilled the 2 lug holes and did the keel punch.  We then placed tibia trial femur, and a 6 mm trial insert. There was  excellent stability throughout full range of motion and no impingement.  The trial was then removed. We drilled small holes in the distal  femur in order to create more conduits for the cement. The cut bone  surfaces were  thoroughly irrigated with pulsatile lavage while the  cement was mixed on the back table. We then cemented the tibial  component into place, impacted it and removed the extruded cement. The  same was done for the femoral component. Trial 6-mm inserts placed,  knee held in full extension, and all extruded cement removed. While the  cement was hardening,  I injected the extensor mechanism, periosteum of  the femur and subcu tissues, a total of 20 mL of Exparel mixed with 30  mL of saline and then did an additional injection of 20 mL of 0.25%  Marcaine into the same tissues. When the cement had fully hardened,  then the permanent polyethylene was placed in tibial tray. There was  excellent stability throughout full range of motion with no lift off the  component and no evidence of any impingement. Wound was copiously  irrigated with saline solution, and the arthrotomy closed over a Hemovac  drain with a running #1 V-Loc suture. The subcutaneous was closed with  interrupted 2-0 Vicryl and subcuticular running 4-0 Monocryl. The drain  was hooked to suction. Incision cleaned and dried and Steri-Strips and  a bulky sterile dressing applied. The tourniquet was released after a  total time of 32 minutes. This was done after closing the extensor  mechanism. The wound was closed and a bulky sterile dressing was  applied. She was placed into a knee immobilizer, awakened and  transported to recovery room in stable condition.  Please note that a surgical assistant was a medical necessity for this  procedure in order to perform it in a safe and expeditious manner.  Assistance was necessary for retracting vital ligaments, neurovascular  structures, as well as for proper positioning of the limb to allow for  appropriate bone cuts and appropriate placement of the prosthesis.    Dione Plover Shameca Landen, MD

## 2014-12-13 NOTE — Interval H&P Note (Signed)
History and Physical Interval Note:  12/13/2014 9:13 AM  [name]  has presented today for surgery, with the diagnosis of MEDIAL COMPARTMENT OSTEOARTHRITIS RIGHT KNEE  The various methods of treatment have been discussed with the patient and family. After consideration of risks, benefits and other options for treatment, the patient has consented to  Procedure(s): RIGHT KNEE UNICOMPARTMENTAL ARTHROPLASTY (Right) as a surgical intervention .  The patient's history has been reviewed, patient examined, no change in status, stable for surgery.  I have reviewed the patient's chart and labs.  Questions were answered to the patient's satisfaction.     [name]

## 2014-12-13 NOTE — Transfer of Care (Signed)
Immediate Anesthesia Transfer of Care Note  Patient: [name]  Procedure(s) Performed: Procedure(s): RIGHT KNEE UNICOMPARTMENTAL ARTHROPLASTY (Right)  Patient Location: PACU  Anesthesia Type:Spinal  Level of Consciousness: awake, alert  and oriented  Airway & Oxygen Therapy: Patient Spontanous Breathing and Patient connected to face mask oxygen  Post-op Assessment: Report given to RN, Post -op Vital signs reviewed and stable and Patient moving all extremities X 4  Post vital signs: Reviewed and stable  Last Vitals:  Filed Vitals:   12/13/14 0737  BP: 130/71  Pulse: 65  Temp: 36.5 C  Resp: 18    Complications: No apparent anesthesia complications

## 2014-12-14 DIAGNOSIS — M1711 Unilateral primary osteoarthritis, right knee: Secondary | ICD-10-CM | POA: Diagnosis not present

## 2014-12-14 LAB — BASIC METABOLIC PANEL
ANION GAP: 5 (ref 5–15)
BUN: 19 mg/dL (ref 6–20)
CALCIUM: 8.9 mg/dL (ref 8.9–10.3)
CO2: 29 mmol/L (ref 22–32)
Chloride: 104 mmol/L (ref 101–111)
Creatinine, Ser: 0.77 mg/dL (ref 0.44–1.00)
Glucose, Bld: 123 mg/dL — ABNORMAL HIGH (ref 65–99)
POTASSIUM: 4.6 mmol/L (ref 3.5–5.1)
Sodium: 138 mmol/L (ref 135–145)

## 2014-12-14 LAB — PROTIME-INR
INR: 1.1 (ref 0.00–1.49)
Prothrombin Time: 14.4 seconds (ref 11.6–15.2)

## 2014-12-14 LAB — CBC
HEMATOCRIT: 33.5 % — AB (ref 36.0–46.0)
Hemoglobin: 10.4 g/dL — ABNORMAL LOW (ref 12.0–15.0)
MCH: 24.5 pg — ABNORMAL LOW (ref 26.0–34.0)
MCHC: 31 g/dL (ref 30.0–36.0)
MCV: 78.8 fL (ref 78.0–100.0)
PLATELETS: 220 10*3/uL (ref 150–400)
RBC: 4.25 MIL/uL (ref 3.87–5.11)
RDW: 15.7 % — AB (ref 11.5–15.5)
WBC: 7.2 10*3/uL (ref 4.0–10.5)

## 2014-12-14 MED ORDER — HYDROMORPHONE HCL 2 MG PO TABS: 2.0000 mg | ORAL_TABLET | ORAL | Status: AC | PRN

## 2014-12-14 MED ORDER — ENOXAPARIN SODIUM 40 MG/0.4ML ~~LOC~~ SOLN: 40.0000 mg | SUBCUTANEOUS | Status: DC

## 2014-12-14 MED ORDER — ONDANSETRON HCL 4 MG PO TABS: 4.0000 mg | ORAL_TABLET | Freq: Four times a day (QID) | ORAL | Status: DC | PRN

## 2014-12-14 MED ORDER — WARFARIN SODIUM 5 MG PO TABS: 5.0000 mg | ORAL_TABLET | Freq: Every day | ORAL | Status: AC

## 2014-12-14 MED ORDER — WARFARIN SODIUM 7.5 MG PO TABS
7.5000 mg | ORAL_TABLET | Freq: Once | ORAL | Status: DC
  Filled 2014-12-14: qty 1

## 2014-12-14 MED ORDER — TRAMADOL HCL 50 MG PO TABS: 50.0000 mg | ORAL_TABLET | Freq: Four times a day (QID) | ORAL | Status: DC | PRN

## 2014-12-14 MED ORDER — WARFARIN SODIUM 1 MG PO TABS: 1.0000 mg | ORAL_TABLET | Freq: Every day | ORAL | Status: AC

## 2014-12-14 MED ORDER — METHOCARBAMOL 500 MG PO TABS
500.0000 mg | ORAL_TABLET | Freq: Four times a day (QID) | ORAL | Status: DC | PRN
Start: 2014-12-14 — End: 2020-12-06

## 2014-12-14 NOTE — Progress Notes (Signed)
Physical Therapy Treatment Patient Details Name: [name] MRN: [id number] DOB: 05/01/1940 Today's Date: 12/14/2014    History of Present Illness 74 y.o. s/p Rt UKR. Pt has had previous left unicompartmental knee arthroplasty.    PT Comments    POD # 1 pm session.  Pt was able to take a nap and now feels better.  Assisted with amb a greater distance and practiced one step.  Pt ready for D/C to home.   Follow Up Recommendations  Home health PT;Supervision/Assistance - 24 hour     Equipment Recommendations  Rolling walker with 5" wheels    Recommendations for Other Services       Precautions / Restrictions Precautions Precautions: Knee Precaution Comments: instructed on KI use for amb and stairs Required Braces or Orthoses: Knee Immobilizer - Right Restrictions Weight Bearing Restrictions: No RLE Weight Bearing: Weight bearing as tolerated    Mobility  Bed Mobility Overal bed mobility: Needs Assistance       Supine to sit: Min assist     General bed mobility comments: assist with RLE  Transfers Overall transfer level: Needs assistance Equipment used: Rolling walker (2 wheeled) Transfers: Sit to/from Stand Sit to Stand: Min assist         General transfer comment: requires increased time and 25% VC's on proper tech and hand placement.  Pt c/o nausea.  Ambulation/Gait Ambulation/Gait assistance: Min guard;Min assist Ambulation Distance (Feet): 75 Feet Assistive device: Rolling walker (2 wheeled) Gait Pattern/deviations: Step-to pattern;Decreased stance time - right Gait velocity: decreased   General Gait Details: 25% VC's proper walker to self distance.  Increased time c/o feeling "bad".    Stairs Stairs: Yes Stairs assistance: Min assist Stair Management: No rails;Step to pattern;Forwards;With walker Number of Stairs: 1 General stair comments: performed twice with spouse and 50% VC's on proper sequencing.    Wheelchair Mobility    Modified  Rankin (Stroke Patients Only)       Balance                                    Cognition Arousal/Alertness: Awake/alert Behavior During Therapy: WFL for tasks assessed/performed Overall Cognitive Status: Within Functional Limits for tasks assessed                      Exercises      General Comments        Pertinent Vitals/Pain Pain Assessment: 0-10 Pain Score: 5  Pain Location: R knee Pain Descriptors / Indicators: Discomfort;Grimacing Pain Intervention(s): Monitored during session;Premedicated before session;Repositioned;Ice applied    Home Living                      Prior Function            PT Goals (current goals can now be found in the care plan section) Progress towards PT goals: Progressing toward goals    Frequency  7X/week    PT Plan      Co-evaluation             End of Session Equipment Utilized During Treatment: Gait belt;Right knee immobilizer Activity Tolerance: Patient tolerated treatment well Patient left: in chair;with call bell/phone within reach     Time: 9371-6967 PT Time Calculation (min) (ACUTE ONLY): 24 min  Charges:  $Gait Training: 8-22 mins $Therapeutic Activity: 8-22 mins  G Codes:      Rica Koyanagi  PTA [facility]  Acute  Rehab Pager      [phone]

## 2014-12-14 NOTE — Care Management Note (Addendum)
Case Management Note  Patient Details  Name: [name] MRN: [id number] Date of Birth: 1941/02/17  Subjective/Objective:                   RIGHT KNEE UNICOMPARTMENTAL ARTHROPLASTY (Right) Action/Plan:  Discharge planning Expected Discharge Date:  12/14/14               Expected Discharge Plan:  [place]  In-House Referral:     Discharge planning Services  CM Consult  Post Acute Care Choice:    Choice offered to:  Patient  DME Arranged: [facility] DME Agency:  [facility]  HH Arranged:  PT, RN [place] Agency:  [facility]  Status of Service:  Completed, signed off  Medicare Important Message Given:    Date Medicare IM Given:    Medicare IM give by:    Date Additional Medicare IM Given:    Additional Medicare Important Message give by:     If discussed at [place] of Stay Meetings, dates discussed:    Additional Comments: CM met with pt to offer choice of home health agency.  [facility] [place] to render HHPT/RN for INR draw protocol.  Referral emailed to [employer], Tim.  CM called [facility] DME rep, Lecretia to please deliver rolling walker to room prior to discharge today.  No other CM needs were communciated. Dellie Catholic, RN 12/14/2014, 9:53 AM

## 2014-12-14 NOTE — Progress Notes (Signed)
Physical Therapy Treatment Patient Details Name: [name] MRN: [id number] DOB: August 13, 1940 Today's Date: 12/14/2014    History of Present Illness 74 y.o. s/p Rt UKR. Pt has had previous left unicompartmental knee arthroplasty.    PT Comments    POD # 1 am session.  Pt feeling "bad" with c/o nausea.  Assisted with amb in hallway.  Did feel better with activity.  Assisted to bathroom.  Then to recliner.   Pt will need another PT session prior to D/C later today.    Follow Up Recommendations  Home health PT;Supervision/Assistance - 24 hour     Equipment Recommendations  Rolling walker with 5" wheels    Recommendations for Other Services       Precautions / Restrictions Precautions Precautions: Knee Precaution Comments: instructed on KI use for amb and stairs Required Braces or Orthoses: Knee Immobilizer - Right Restrictions Weight Bearing Restrictions: No RLE Weight Bearing: Weight bearing as tolerated    Mobility  Bed Mobility Overal bed mobility: Needs Assistance       Supine to sit: Min assist     General bed mobility comments: assist with RLE  Transfers Overall transfer level: Needs assistance Equipment used: Rolling walker (2 wheeled) Transfers: Sit to/from Stand Sit to Stand: Min assist         General transfer comment: requires increased time and 25% VC's on proper tech and hand placement.  Pt c/o nausea.  Ambulation/Gait Ambulation/Gait assistance: Min guard;Min assist Ambulation Distance (Feet): 45 Feet Assistive device: Rolling walker (2 wheeled) Gait Pattern/deviations: Step-to pattern;Decreased stance time - right Gait velocity: decreased   General Gait Details: 25% VC's proper walker to self distance.  Increased time c/o feeling "bad".    Stairs            Wheelchair Mobility    Modified Rankin (Stroke Patients Only)       Balance                                    Cognition Arousal/Alertness:  Awake/alert Behavior During Therapy: WFL for tasks assessed/performed Overall Cognitive Status: Within Functional Limits for tasks assessed                      Exercises      General Comments        Pertinent Vitals/Pain Pain Assessment: 0-10 Pain Score: 5  Pain Location: R knee Pain Descriptors / Indicators: Discomfort;Grimacing Pain Intervention(s): Monitored during session;Premedicated before session;Repositioned;Ice applied    Home Living                      Prior Function            PT Goals (current goals can now be found in the care plan section) Progress towards PT goals: Progressing toward goals    Frequency  7X/week    PT Plan      Co-evaluation             End of Session Equipment Utilized During Treatment: Gait belt;Right knee immobilizer Activity Tolerance: Patient tolerated treatment well Patient left: in chair;with call bell/phone within reach     Time: 0930-1000 PT Time Calculation (min) (ACUTE ONLY): 30 min  Charges:  $Gait Training: 8-22 mins $Therapeutic Activity: 8-22 mins                    G  Codes:      Rica Koyanagi  PTA [facility]  Acute  Rehab Pager      [phone]

## 2014-12-14 NOTE — Evaluation (Signed)
Occupational Therapy Evaluation Patient Details Name: [name] MRN: [id number] DOB: April 14, 1940 Today's Date: 12/14/2014    History of Present Illness 74 y.o. s/p Rt UKR.      Clinical Impression   Pt s/p above. Education provided in session and pt verbalized understanding. She will have spouse available to assist at home.     Follow Up Recommendations  No OT follow up;Supervision - Intermittent    Equipment Recommendations  None recommended by OT    Recommendations for Other Services       Precautions / Restrictions Precautions Precautions: Knee Required Braces or Orthoses: Knee Immobilizer - Right Restrictions Weight Bearing Restrictions: Yes RLE Weight Bearing: Weight bearing as tolerated      Mobility Bed Mobility Overal bed mobility: Needs Assistance Bed Mobility: Supine to Sit     Supine to sit: Min assist     General bed mobility comments: assist with RLE  Transfers Overall transfer level: Needs assistance Equipment used: Rolling walker (2 wheeled) Transfers: Sit to/from Stand Sit to Stand: Min assist              Balance   No LOB in session.                                            ADL Overall ADL's : Needs assistance/impaired Eating/Feeding: Independent   Grooming: Wash/dry face;Set up;Sitting               Lower Body Dressing: Moderate assistance;Sit to/from stand   Toilet Transfer: Minimal assistance;Ambulation;RW (Min guard-ambulation; Min assist-sit to stand)           Functional mobility during ADLs: Min guard;Rolling walker General ADL Comments: Educated on LB dressing technique. Educated on safety. Discussed tub transfer techniques and options for shower chair. Pt plans to sponge bathe for now. Educated on AE-pt plans to let spouse assist.     Vision     Perception     Praxis      Pertinent Vitals/Pain Pain Assessment: 0-10 Pain Score: 1  Pain Location: Rt knee Pain Intervention(s):  Monitored during session;Repositioned     Hand Dominance     Extremity/Trunk Assessment Upper Extremity Assessment Upper Extremity Assessment: Overall WFL for tasks assessed   Lower Extremity Assessment Lower Extremity Assessment: Defer to PT evaluation       Communication Communication Communication: No difficulties   Cognition Arousal/Alertness: Awake/alert Behavior During Therapy: WFL for tasks assessed/performed Overall Cognitive Status: Within Functional Limits for tasks assessed                     General Comments       Exercises       Shoulder Instructions      Home Living Family/patient expects to be discharged to:: Private residence Living Arrangements: Spouse/significant other Available Help at Discharge: Family Type of Home: House Home Access: Stairs to enter Technical brewer of Steps: 1 Entrance Stairs-Rails: None Home Layout: One level     Bathroom Shower/Tub: Tub/shower unit Shower/tub characteristics: Curtain Biochemist, clinical: Handicapped height     Home Equipment: Environmental consultant - 2 wheels;Bedside commode          Prior Functioning/Environment Level of Independence: Independent             OT Diagnosis: Acute pain   OT Problem List:     OT Treatment/Interventions:  OT Goals(Current goals can be found in the care plan section)    OT Frequency:     Barriers to D/C:            Co-evaluation              End of Session Equipment Utilized During Treatment: Gait belt;Rolling walker (Oxygen placed back on towards end of session) Nurse Communication:  (asked tech to apply ice on pt's knee)  Activity Tolerance: Other (comment) (nausea) Patient left: in chair;with call bell/phone within reach   Time: 0849-0909 OT Time Calculation (min): 20 min Charges:  OT General Charges $OT Visit: 1 Procedure OT Evaluation $Initial OT Evaluation Tier I: 1 Procedure G-Codes: OT G-codes **NOT FOR INPATIENT CLASS** Functional  Assessment Tool Used: clinical judgment Functional Limitation: Self care Self Care Current Status (T2182): At least 20 percent but less than 40 percent impaired, limited or restricted Self Care Goal Status (E8337): At least 20 percent but less than 40 percent impaired, limited or restricted Self Care Discharge Status [phone]): At least 20 percent but less than 40 percent impaired, limited or restricted  Benito Mccreedy OTR/L [phone] 12/14/2014, 9:48 AM

## 2014-12-14 NOTE — Discharge Instructions (Signed)
° °Dr. Frank Aluisio °[facility] Specialist °[facility] °[address] °[place], [place] [address] °[phone] ° °UNI KNEE REPLACEMENT POSTOPERATIVE DIRECTIONS ° ° °Knee Rehabilitation, Guidelines Following Surgery  °Results after knee surgery are often greatly improved when you follow the exercise, range of motion and muscle strengthening exercises prescribed by your doctor. Safety measures are also important to protect the knee from further injury. Any time any of these exercises cause you to have increased pain or swelling in your knee joint, decrease the amount until you are comfortable again and slowly increase them. If you have problems or questions, call your caregiver or physical therapist for advice.  ° °HOME CARE INSTRUCTIONS  °Remove items at home which could result in a fall. This includes throw rugs or furniture in walking pathways.  °· ICE to the affected knee every three hours for 30 minutes at a time and then as needed for pain and swelling.  Continue to use ice on the knee for pain and swelling from surgery. You may notice swelling that will progress down to the foot and ankle.  This is normal after surgery.  Elevate the leg when you are not up walking on it.   °· Continue to use the breathing machine which will help keep your temperature down.  It is common for your temperature to cycle up and down following surgery, especially at night when you are not up moving around and exerting yourself.  The breathing machine keeps your lungs expanded and your temperature down. °· Do not place pillow under knee, focus on keeping the knee straight while resting ° °DIET °You may resume your previous home diet once your are discharged from the hospital. ° °DRESSING / WOUND CARE / SHOWERING °You may shower 3 days after surgery, but keep the wounds dry during showering.  You may use an occlusive plastic wrap (Press'n Seal for example), NO SOAKING/SUBMERGING IN THE BATHTUB.  If the  bandage gets wet, change with a clean dry gauze.  If the incision gets wet, pat the wound dry with a clean towel. °You may start showering once you are discharged home but do not submerge the incision under water. Just pat the incision dry and apply a dry gauze dressing on daily. °Change the surgical dressing daily and reapply a dry dressing each time. ° °ACTIVITY °Walk with your walker as instructed. °Use walker as long as suggested by your caregivers. °Avoid periods of inactivity such as sitting longer than an hour when not asleep. This helps prevent blood clots.  °You may resume a sexual relationship in one month or when given the OK by your doctor.  °You may return to work once you are cleared by your doctor.  °Do not drive a car for 6 weeks or until released by you surgeon.  °Do not drive while taking narcotics. ° °WEIGHT BEARING °Weight bearing as tolerated with assist device (walker, cane, etc) as directed, use it as long as suggested by your surgeon or therapist, typically at least 4-6 weeks. ° °POSTOPERATIVE CONSTIPATION PROTOCOL °Constipation - defined medically as fewer than three stools per week and severe constipation as less than one stool per week. ° °One of the most common issues patients have following surgery is constipation.  Even if you have a regular bowel pattern at home, your normal regimen is likely to be disrupted due to multiple reasons following surgery.  Combination of anesthesia, postoperative narcotics, change in appetite and fluid intake all can affect your bowels.    In order to avoid complications following surgery, here are some recommendations in order to help you during your recovery period.  Colace (docusate) - Pick up an over-the-counter form of Colace or another stool softener and take twice a day as long as you are requiring postoperative pain medications.  Take with a full glass of water daily.  If you experience loose stools or diarrhea, hold the colace until you stool forms  back up.  If your symptoms do not get better within 1 week or if they get worse, check with your doctor.  Dulcolax (bisacodyl) - Pick up over-the-counter and take as directed by the product packaging as needed to assist with the movement of your bowels.  Take with a full glass of water.  Use this product as needed if not relieved by Colace only.   MiraLax (polyethylene glycol) - Pick up over-the-counter to have on hand.  MiraLax is a solution that will increase the amount of water in your bowels to assist with bowel movements.  Take as directed and can mix with a glass of water, juice, soda, coffee, or tea.  Take if you go more than two days without a movement. Do not use MiraLax more than once per day. Call your doctor if you are still constipated or irregular after using this medication for 7 days in a row.  If you continue to have problems with postoperative constipation, please contact the office for further assistance and recommendations.  If you experience "the worst abdominal pain ever" or develop nausea or vomiting, please contact the office immediatly for further recommendations for treatment.  ITCHING  If you experience itching with your medications, try taking only a single pain pill, or even half a pain pill at a time.  You can also use Benadryl over the counter for itching or also to help with sleep.   TED HOSE STOCKINGS Wear the elastic stockings on both legs for three weeks following surgery during the day but you may remove then at night for sleeping.  MEDICATIONS See your medication summary on the After Visit Summary that the nursing staff will review with you prior to discharge.  You may have some home medications which will be placed on hold until you complete the course of blood thinner medication.  It is important for you to complete the blood thinner medication as prescribed by your surgeon.  Continue your approved medications as instructed at time of  discharge.  PRECAUTIONS If you experience chest pain or shortness of breath - call 911 immediately for transfer to the hospital emergency department.  If you develop a fever greater that 101 F, purulent drainage from wound, increased redness or drainage from wound, foul odor from the wound/dressing, or calf pain - CONTACT YOUR SURGEON.                                                   FOLLOW-UP APPOINTMENTS Make sure you keep all of your appointments after your operation with your surgeon and caregivers. You should call the office at the above phone number and make an appointment for approximately two weeks after the date of your surgery or on the date instructed by your surgeon outlined in the "After Visit Summary".  RANGE OF MOTION AND STRENGTHENING EXERCISES  Rehabilitation of the knee is important following a knee injury or an  operation. After just a few days of immobilization, the muscles of the thigh which control the knee become weakened and shrink (atrophy). Knee exercises are designed to build up the tone and strength of the thigh muscles and to improve knee motion. Often times heat used for twenty to thirty minutes before working out will loosen up your tissues and help with improving the range of motion but do not use heat for the first two weeks following surgery. These exercises can be done on a training (exercise) mat, on the floor, on a table or on a bed. Use what ever works the best and is most comfortable for you Knee exercises include:  Leg Lifts - While your knee is still immobilized in a splint or cast, you can do straight leg raises. Lift the leg to 60 degrees, hold for 3 sec, and slowly lower the leg. Repeat 10-20 times 2-3 times daily. Perform this exercise against resistance later as your knee gets better.  Quad and Hamstring Sets - Tighten up the muscle on the front of the thigh (Quad) and hold for 5-10 sec. Repeat this 10-20 times hourly. Hamstring sets are done by pushing the  foot backward against an object and holding for 5-10 sec. Repeat as with quad sets.   Leg Slides: Lying on your back, slowly slide your foot toward your buttocks, bending your knee up off the floor (only go as far as is comfortable). Then slowly slide your foot back down until your leg is flat on the floor again.  Angel Wings: Lying on your back spread your legs to the side as far apart as you can without causing discomfort.  A rehabilitation program following serious knee injuries can speed recovery and prevent re-injury in the future due to weakened muscles. Contact your doctor or a physical therapist for more information on knee rehabilitation.   IF YOU ARE TRANSFERRED TO A SKILLED REHAB FACILITY If the patient is transferred to a skilled rehab facility following release from the hospital, a list of the current medications will be sent to the facility for the patient to continue.  When discharged from the skilled rehab facility, please have the facility set up the patient's [place] prior to being released. Also, the skilled facility will be responsible for providing the patient with their medications at time of release from the facility to include their pain medication, the muscle relaxants, and their blood thinner medication. If the patient is still at the rehab facility at time of the two week follow up appointment, the skilled rehab facility will also need to assist the patient in arranging follow up appointment in our office and any transportation needs.  MAKE SURE YOU:  Understand these instructions.  Get help right away if you are not doing well or get worse.    Pick up stool softner and laxative for home use following surgery while on pain medications. Do not submerge incision under water. Please use good hand washing techniques while changing dressing each day. May shower starting three days after surgery. Please use a clean towel to pat the incision dry following  showers. Continue to use ice for pain and swelling after surgery. Do not use any lotions or creams on the incision until instructed by your surgeon.  Take Coumadin for three weeks for postoperative protocol and then the patient may resume their previous Coumadin home regimen.  The dose may need to be adjusted based upon the INR.  Please follow the INR and  titrate Coumadin dose for a therapeutic range between 2.0 and 3.0 INR.  After completing the three weeks of Coumadin, the patient may resume their previous Coumadin home regimen.  Continue Lovenox injections until the INR is therapeutic at or greater than 2.0.  When INR reaches the therapeutic level of equal to or greater than 2.0, the patient may discontinue the Lovenox injections.

## 2014-12-14 NOTE — Progress Notes (Signed)
ANTICOAGULATION CONSULT NOTE - Initial Consult  Pharmacy Consult for warfarin Indication: VTE prophylaxis  Allergies  Allergen Reactions  . Hydromorphone Nausea Only  . Amiodarone Nausea And Vomiting  . Codeine Nausea And Vomiting  . Hydrocodone Nausea And Vomiting  . Morphine Nausea And Vomiting  . Prednisone     Keeps her awake  . Statins Other (See Comments)    Leg cramps" Lipitor only"  . Procaine Hcl Palpitations    Able to handle lidocaine for IV start without any complications    Patient Measurements: Height: 5\' 6"  (167.6 cm) Weight: 158 lb (71.668 kg) IBW/kg (Calculated) : 59.3 Heparin Dosing Weight:   Vital Signs: Temp: 97.8 F (36.6 C) (10/25 0645) Temp Source: Oral (10/25 0645) BP: 135/79 mmHg (10/25 0645) Pulse Rate: 68 (10/25 0645)  Labs:  Recent Labs  12/13/14 0740 12/14/14 0510  HGB  --  10.4*  HCT  --  33.5*  PLT  --  220  LABPROT 14.2 14.4  INR 1.08 1.10  CREATININE  --  0.77    Estimated Creatinine Clearance: 63.6 mL/min (by C-G formula based on Cr of 0.77).   Medical History: Past Medical History  Diagnosis Date  . Allergic rhinitis   . Breast cancer ([place]) 1999, 2008    s/p bilat mastectomy  . Pulmonary nodules     no change CT chest 3/10  . GERD (gastroesophageal reflux disease)   . HTN (hypertension)   . Hyperlipidemia   . NSVT (nonsustained ventricular tachycardia) ([place])     a. 05/2013 after MI->amio caused nausea->BB only.  . Hypothyroid   . Ischemic cardiomyopathy     a. 05/2013 Echo: EF 40-45%, midinferior AK.  . Takotsubo cardiomyopathy     a. 2013 with subsequent recovery of LV fxn (followed by MI in 05/2013 with LV dysfxn).  Marland Kitchen CAD (coronary artery disease)     a. H/o nonobs dzs and vasospasm with takotsubo CM;  b. 05/2013 Inf STEMI/Cath: LM nl, LAD nl, LCX nondom 100d (failed PCI), RCA dominant, nl, RPDA 53m (40mm vessel-->Med Rx), EF 35-40%.  . Coronary vasospasm ([place])     some chest pain "last chest pressure episode 12'15  -evaluated at [facility], multiple testing-ruleout MI, "thought anxiety"  . DVT of popliteal vein ([facility])     left knee- caused bilateral Pulmonary emboli" Coumadin"  . Depression   . Anxiety   . Pulmonary emboli ([place])     history of dx. 07-05-13 [facility]  . Myocardial infarction ([place]) 05/2013  . Arthritis   . Dysrhythmia     NSVT    Medications:  Scheduled:  . acetaminophen  1,000 mg Oral 4 times per day  . docusate sodium  100 mg Oral BID  . enoxaparin  40 mg Subcutaneous Q24H  . escitalopram  5 mg Oral q morning - 10a  . famotidine  20 mg Oral QHS  . levothyroxine  75 mcg Oral QAC breakfast  . loratadine  10 mg Oral Daily  . magnesium oxide  400 mg Oral QPC lunch  . metoprolol tartrate  12.5 mg Oral BID  . pravastatin  40 mg Oral Daily  . spironolactone  25 mg Oral q morning - 10a  . Warfarin - Pharmacist Dosing Inpatient   Does not apply q1800    Assessment: Pt is a 74 yo female s/p right knee medial unicompartmental arthroplasty. Pt is on warfarin PTA for bilateral PE. Pt's home regimen has been warfarin 4.5 mg PO daily. Pt has been therapeutic on  this regimen as outpt. Medication has been held since 10/19 and pt has been bridged with enoxaparin 40 mg [place] daily. Baseline INR is 1.08. Pt has also been ordered enoxaparin 40 mg [place] daily starting on 10/25.  12/14/14 Hgb 10.4,  Plt 220 INR 1.10 No reported signs of bleeding.   10/24 -- warfarin 7 mg 10/25 -- warfarin 7.5 mg   Goal of Therapy:  INR 2-3 Monitor platelets by anticoagulation protocol: Yes   Plan:  Warfarin 7.5 mg PO x 1  Daily INR Daily CBC  Royetta Asal, PharmD, BCPS Pager [phone]  12/14/2014,10:29 AM

## 2014-12-14 NOTE — Progress Notes (Signed)
Subjective: 1 Day Post-Op Procedure(s) (LRB): RIGHT KNEE UNICOMPARTMENTAL ARTHROPLASTY (Right) Patient reports pain as mild.   Patient seen in rounds with Dr. Wynelle Link. Patient is well, but has had some minor complaints of pain in the knee, requiring pain medications and some intermittent nausea last night.  Taking the pain medication with a nausea pill. Patient is ready to go home later today if doing well.  Objective: Vital signs in last 24 hours: Temp:  [97.2 F (36.2 C)-98.5 F (36.9 C)] 97.8 F (36.6 C) (10/25 0645) Pulse Rate:  [53-68] 68 (10/25 0645) Resp:  [11-16] 16 (10/25 0645) BP: (122-151)/(57-79) 135/79 mmHg (10/25 0645) SpO2:  [94 %-100 %] 98 % (10/25 0645) Weight:  [71.668 kg (158 lb)] 71.668 kg (158 lb) (10/24 0806)  Intake/Output from previous day:  Intake/Output Summary (Last 24 hours) at 12/14/14 0743 Last data filed at 12/14/14 0646  Gross per 24 hour  Intake 3446.25 ml  Output   1555 ml  Net 1891.25 ml    Intake/Output this shift: UOP 825 since around MN  Labs:  Recent Labs  12/14/14 0510  HGB 10.4*    Recent Labs  12/14/14 0510  WBC 7.2  RBC 4.25  HCT 33.5*  PLT 220    Recent Labs  12/14/14 0510  NA 138  K 4.6  CL 104  CO2 29  BUN 19  CREATININE 0.77  GLUCOSE 123*  CALCIUM 8.9    Recent Labs  12/13/14 0740 12/14/14 0510  INR 1.08 1.10    EXAM: General - Patient is Alert, Appropriate and Oriented Extremity - Neurovascular intact Sensation intact distally Dorsiflexion/Plantar flexion intact Incision - clean, dry, no drainage, healing Motor Function - intact, moving foot and toes well on exam.   Assessment/Plan: 1 Day Post-Op Procedure(s) (LRB): RIGHT KNEE UNICOMPARTMENTAL ARTHROPLASTY (Right) Procedure(s) (LRB): RIGHT KNEE UNICOMPARTMENTAL ARTHROPLASTY (Right) Past Medical History  Diagnosis Date  . Allergic rhinitis   . Breast cancer ([place]) 1999, 2008    s/p bilat mastectomy  . Pulmonary nodules     no  change CT chest 3/10  . GERD (gastroesophageal reflux disease)   . HTN (hypertension)   . Hyperlipidemia   . NSVT (nonsustained ventricular tachycardia) ([place])     a. 05/2013 after MI->amio caused nausea->BB only.  . Hypothyroid   . Ischemic cardiomyopathy     a. 05/2013 Echo: EF 40-45%, midinferior [place].  . Takotsubo cardiomyopathy     a. 2013 with subsequent recovery of LV fxn (followed by MI in 05/2013 with LV dysfxn).  Marland Kitchen CAD (coronary artery disease)     a. H/o nonobs dzs and vasospasm with takotsubo CM;  b. 05/2013 Inf STEMI/Cath: LM nl, LAD nl, LCX nondom 100d (failed PCI), RCA dominant, nl, RPDA 46m (12mm vessel-->Med Rx), EF 35-40%.  . Coronary vasospasm ([place])     some chest pain "last chest pressure episode 12'15 -evaluated at [facility], multiple testing-ruleout MI, "thought anxiety"  . DVT of popliteal vein ([facility])     left knee- caused bilateral Pulmonary emboli" Coumadin"  . Depression   . Anxiety   . Pulmonary emboli ([place])     history of dx. 07-05-13 [facility]  . Myocardial infarction ([place]) 05/2013  . Arthritis   . Dysrhythmia     NSVT   Principal Problem:   OA (osteoarthritis) of knee  Estimated body mass index is 25.51 kg/(m^2) as calculated from the following:   Height as of this encounter: 5\' 6"  (1.676 m).   Weight as of this  encounter: 71.668 kg (158 lb). Up with therapy Discharge home with home health Diet - Cardiac diet Follow up - in 2 weeks on Tuesday November 8th Activity - WBAT Disposition - Home Condition Upon Discharge - Good D/C Meds - See DC Summary DVT Prophylaxis - Lovenox and Coumadin  Arlee Muslim, PA-C Orthopaedic Surgery 12/14/2014, 7:43 AM

## 2014-12-14 NOTE — Discharge Summary (Signed)
Physician Discharge Summary   Patient ID: [name] MRN: [id number] DOB/AGE: 73/10/1940 73 y.o.  Admit date: 12/13/2014 Discharge date: 12-14-2014  Primary Diagnosis:  Medial compartment osteoarthritis, Right knee  Admission Diagnoses:  Past Medical History  Diagnosis Date  . Allergic rhinitis   . Breast cancer ([place]) 1999, 2008    s/p bilat mastectomy  . Pulmonary nodules     no change CT chest 3/10  . GERD (gastroesophageal reflux disease)   . HTN (hypertension)   . Hyperlipidemia   . NSVT (nonsustained ventricular tachycardia) ([place])     a. 05/2013 after MI->amio caused nausea->BB only.  . Hypothyroid   . Ischemic cardiomyopathy     a. 05/2013 Echo: EF 40-45%, midinferior AK.  . Takotsubo cardiomyopathy     a. 2013 with subsequent recovery of LV fxn (followed by MI in 05/2013 with LV dysfxn).  Marland Kitchen CAD (coronary artery disease)     a. H/o nonobs dzs and vasospasm with takotsubo CM;  b. 05/2013 Inf STEMI/Cath: LM nl, LAD nl, LCX nondom 100d (failed PCI), RCA dominant, nl, RPDA 23m (75mm vessel-->Med Rx), EF 35-40%.  . Coronary vasospasm ([place])     some chest pain "last chest pressure episode 12'15 -evaluated at [facility], multiple testing-ruleout MI, "thought anxiety"  . DVT of popliteal vein ([facility])     left knee- caused bilateral Pulmonary emboli" Coumadin"  . Depression   . Anxiety   . Pulmonary emboli ([place])     history of dx. 07-05-13 [facility]  . Myocardial infarction ([place]) 05/2013  . Arthritis   . Dysrhythmia     NSVT   Discharge Diagnoses:   Principal Problem:   OA (osteoarthritis) of knee  Estimated body mass index is 25.51 kg/(m^2) as calculated from the following:   Height as of this encounter: $RemoveBeforeD'5\' 6"'[id number]$  (1.676 m).   Weight as of this encounter: 71.668 kg (158 lb).  Procedure:  Procedure(s) (LRB): RIGHT KNEE UNICOMPARTMENTAL ARTHROPLASTY (Right)   Consults: None  HPI: [name] is a 74 y.o. female, who has  significant isolated medial compartment arthritis of the  Right knee. She has had nonoperative management including injections. She has had  cortisone and viscous supplements. Unfortunately, the pain persists.  Radiograph showed isolated medial compartment bone-on-bone arthritis  with normal-appearing patellofemoral and lateral compartments. She  presents now for right knee unicompartmental arthroplasty.   Laboratory Data: Admission on 12/13/2014  Component Date Value Ref Range Status  . Prothrombin Time 12/13/2014 14.2  11.6 - 15.2 seconds Final  . INR 12/13/2014 1.08  0.00 - 1.49 Final  . WBC 12/14/2014 7.2  4.0 - 10.5 K/uL Final  . RBC 12/14/2014 4.25  3.87 - 5.11 MIL/uL Final  . Hemoglobin 12/14/2014 10.4* 12.0 - 15.0 g/dL Final  . HCT 12/14/2014 33.5* 36.0 - 46.0 % Final  . MCV 12/14/2014 78.8  78.0 - 100.0 fL Final  . MCH 12/14/2014 24.5* 26.0 - 34.0 pg Final  . MCHC 12/14/2014 31.0  30.0 - 36.0 g/dL Final  . RDW 12/14/2014 15.7* 11.5 - 15.5 % Final  . Platelets 12/14/2014 220  150 - 400 K/uL Final  . Sodium 12/14/2014 138  135 - 145 mmol/L Final  . Potassium 12/14/2014 4.6  3.5 - 5.1 mmol/L Final  . Chloride 12/14/2014 104  101 - 111 mmol/L Final  . CO2 12/14/2014 29  22 - 32 mmol/L Final  . Glucose, Bld 12/14/2014 123* 65 - 99 mg/dL Final  . BUN 12/14/2014 19  6 - 20  mg/dL Final  . Creatinine, Ser 12/14/2014 0.77  0.44 - 1.00 mg/dL Final  . Calcium 12/14/2014 8.9  8.9 - 10.3 mg/dL Final  . GFR calc non Af Amer 12/14/2014 >60  >60 mL/min Final  . GFR calc Af Amer 12/14/2014 >60  >60 mL/min Final   Comment: (NOTE) The eGFR has been calculated using the CKD EPI equation. This calculation has not been validated in all clinical situations. eGFR's persistently <60 mL/min signify possible Chronic Kidney Disease.   . Anion gap 12/14/2014 5  5 - 15 Final  . Prothrombin Time 12/14/2014 14.4  11.6 - 15.2 seconds Final  . INR 12/14/2014 1.10  0.00 - 1.49 Final  Hospital Outpatient Visit on 12/07/2014  Component Date Value Ref Range  Status  . MRSA, PCR 12/07/2014 NEGATIVE  NEGATIVE Final  . Staphylococcus aureus 12/07/2014 NEGATIVE  NEGATIVE Final   Comment:        The Xpert SA Assay (FDA approved for NASAL specimens in patients over 15 years of age), is one component of a comprehensive surveillance program.  Test performance has been validated by [facility] for patients greater than or equal to 33 year old. It is not intended to diagnose infection nor to guide or monitor treatment.   Marland Kitchen aPTT 12/07/2014 36  24 - 37 seconds Final  . WBC 12/07/2014 5.0  4.0 - 10.5 K/uL Final  . RBC 12/07/2014 4.89  3.87 - 5.11 MIL/uL Final  . Hemoglobin 12/07/2014 11.7* 12.0 - 15.0 g/dL Final  . HCT 12/07/2014 37.8  36.0 - 46.0 % Final  . MCV 12/07/2014 77.3* 78.0 - 100.0 fL Final  . MCH 12/07/2014 23.9* 26.0 - 34.0 pg Final  . MCHC 12/07/2014 31.0  30.0 - 36.0 g/dL Final  . RDW 12/07/2014 15.4  11.5 - 15.5 % Final  . Platelets 12/07/2014 249  150 - 400 K/uL Final  . Sodium 12/07/2014 140  135 - 145 mmol/L Final  . Potassium 12/07/2014 4.6  3.5 - 5.1 mmol/L Final  . Chloride 12/07/2014 106  101 - 111 mmol/L Final  . CO2 12/07/2014 28  22 - 32 mmol/L Final  . Glucose, Bld 12/07/2014 97  65 - 99 mg/dL Final  . BUN 12/07/2014 26* 6 - 20 mg/dL Final  . Creatinine, Ser 12/07/2014 1.00  0.44 - 1.00 mg/dL Final  . Calcium 12/07/2014 9.5  8.9 - 10.3 mg/dL Final  . Total Protein 12/07/2014 7.4  6.5 - 8.1 g/dL Final  . Albumin 12/07/2014 4.5  3.5 - 5.0 g/dL Final  . AST 12/07/2014 20  15 - 41 U/L Final  . ALT 12/07/2014 13* 14 - 54 U/L Final  . Alkaline Phosphatase 12/07/2014 110  38 - 126 U/L Final  . Total Bilirubin 12/07/2014 0.6  0.3 - 1.2 mg/dL Final  . GFR calc non Af Amer 12/07/2014 55* >60 mL/min Final  . GFR calc Af Amer 12/07/2014 >60  >60 mL/min Final   Comment: (NOTE) The eGFR has been calculated using the CKD EPI equation. This calculation has not been validated in all clinical situations. eGFR's persistently <60  mL/min signify possible Chronic Kidney Disease.   . Anion gap 12/07/2014 6  5 - 15 Final  . Prothrombin Time 12/07/2014 23.3* 11.6 - 15.2 seconds Final  . INR 12/07/2014 2.09* 0.00 - 1.49 Final  . ABO/RH(D) 12/07/2014 B POS   Final  . Antibody Screen 12/07/2014 NEG   Final  . Sample Expiration 12/07/2014 12/16/2014   Final  . Color,  Urine 12/07/2014 YELLOW  YELLOW Final  . APPearance 12/07/2014 CLEAR  CLEAR Final  . Specific Gravity, Urine 12/07/2014 1.025  1.005 - 1.030 Final  . pH 12/07/2014 6.0  5.0 - 8.0 Final  . Glucose, UA 12/07/2014 NEGATIVE  NEGATIVE mg/dL Final  . Hgb urine dipstick 12/07/2014 NEGATIVE  NEGATIVE Final  . Bilirubin Urine 12/07/2014 NEGATIVE  NEGATIVE Final  . Ketones, ur 12/07/2014 NEGATIVE  NEGATIVE mg/dL Final  . Protein, ur 12/07/2014 NEGATIVE  NEGATIVE mg/dL Final  . Urobilinogen, UA 12/07/2014 0.2  0.0 - 1.0 mg/dL Final  . Nitrite 12/07/2014 NEGATIVE  NEGATIVE Final  . Leukocytes, UA 12/07/2014 NEGATIVE  NEGATIVE Final   MICROSCOPIC NOT DONE ON URINES WITH NEGATIVE PROTEIN, BLOOD, LEUKOCYTES, NITRITE, OR GLUCOSE <1000 mg/dL.     X-Rays:No results found.  EKG: Orders placed or performed during the hospital encounter of 05/21/14  . EKG 12-Lead  . EKG 12-Lead     Hospital Course: KENNIYA WESTRICH is a 74 y.o. who was admitted to [facility]. They were brought to the operating room on 12/13/2014 and underwent Procedure(s): RIGHT KNEE UNICOMPARTMENTAL ARTHROPLASTY.  Patient tolerated the procedure well and was later transferred to the recovery room and then to the orthopaedic floor for postoperative care.  They were given PO and IV analgesics for pain control following their surgery.  They were given 24 hours of postoperative antibiotics of  Anti-infectives    Start     Dose/Rate Route Frequency Ordered Stop   12/13/14 1600  ceFAZolin (ANCEF) IVPB 2 g/50 mL premix     2 g 100 mL/hr over 30 Minutes Intravenous Every 6 hours 12/13/14 1238 12/13/14  2200   12/13/14 0734  ceFAZolin (ANCEF) IVPB 2 g/50 mL premix     2 g 100 mL/hr over 30 Minutes Intravenous On call to O.R. 12/13/14 0734 12/13/14 1030     and started on DVT prophylaxis in the form of Lovenox and Coumadin.   PT and OT were ordered for total joint protocol.  Discharge planning consulted to help with postop disposition and equipment needs.  Patient had a decnt night on the evening of surgery with the exception of some nausea.  They started to get up OOB with therapy on day one. Hemovac drain was pulled without difficulty. Patient was seen in rounds by Dr. Wynelle Link and it was felt that as long as they did well with therapy that they would be ready to go home later that morning following therapy.  Discharge home with home health Diet - Cardiac diet Follow up - in 2 weeks on Tuesday November 8th Activity - WBAT Disposition - Home Condition Upon Discharge - Good D/C Meds - See DC Summary DVT Prophylaxis - Lovenox and Coumadin  Discharge Instructions    Call MD / Call 911    Complete by:  As directed   If you experience chest pain or shortness of breath, CALL 911 and be transported to the hospital emergency room.  If you develope a fever above 101 F, pus (white drainage) or increased drainage or redness at the wound, or calf pain, call your surgeon's office.     Change dressing    Complete by:  As directed   Change dressing daily with sterile 4 x 4 inch gauze dressing and apply TED hose. Do not submerge the incision under water.     Constipation Prevention    Complete by:  As directed   Drink plenty of fluids.  Prune juice may  be helpful.  You may use a stool softener, such as Colace (over the counter) 100 mg twice a day.  Use MiraLax (over the counter) for constipation as needed.     Diet - low sodium heart healthy    Complete by:  As directed      Discharge instructions    Complete by:  As directed   Pick up stool softner and laxative for home use following surgery while on  pain medications. Do not submerge incision under water. May remove the surgical dressing tomorrow, Wednesday 12/15/2014, and then apply a dry gauze dressing daily. Please use good hand washing techniques while changing dressing each day. May shower starting three days after surgery staring Thursday 12/16/2014. Please use a clean towel to pat the incision dry following showers. Continue to use ice for pain and swelling after surgery. Do not use any lotions or creams on the incision until instructed by your surgeon.  Postoperative Constipation Protocol  Constipation - defined medically as fewer than three stools per week and severe constipation as less than one stool per week.  One of the most common issues patients have following surgery is constipation.  Even if you have a regular bowel pattern at home, your normal regimen is likely to be disrupted due to multiple reasons following surgery.  Combination of anesthesia, postoperative narcotics, change in appetite and fluid intake all can affect your bowels.  In order to avoid complications following surgery, here are some recommendations in order to help you during your recovery period.  Colace (docusate) - Pick up an over-the-counter form of Colace or another stool softener and take twice a day as long as you are requiring postoperative pain medications.  Take with a full glass of water daily.  If you experience loose stools or diarrhea, hold the colace until you stool forms back up.  If your symptoms do not get better within 1 week or if they get worse, check with your doctor.  Dulcolax (bisacodyl) - Pick up over-the-counter and take as directed by the product packaging as needed to assist with the movement of your bowels.  Take with a full glass of water.  Use this product as needed if not relieved by Colace only.   MiraLax (polyethylene glycol) - Pick up over-the-counter to have on hand.  MiraLax is a solution that will increase the amount of  water in your bowels to assist with bowel movements.  Take as directed and can mix with a glass of water, juice, soda, coffee, or tea.  Take if you go more than two days without a movement. Do not use MiraLax more than once per day. Call your doctor if you are still constipated or irregular after using this medication for 7 days in a row.  If you continue to have problems with postoperative constipation, please contact the office for further assistance and recommendations.  If you experience "the worst abdominal pain ever" or develop nausea or vomiting, please contact the office immediatly for further recommendations for treatment.  Take Coumadin for three weeks for postoperative protocol and then the patient may resume their previous Coumadin home regimen.  The dose may need to be adjusted based upon the INR.  Please follow the INR and titrate Coumadin dose for a therapeutic range between 2.0 and 3.0 INR.  After completing the three weeks of Coumadin, the patient may resume their previous Coumadin home regimen.  Continue Lovenox injections until the INR is therapeutic at or greater than 2.0.  When INR reaches the therapeutic level of equal to or greater than 2.0, the patient may discontinue the Lovenox injections.     Do not put a pillow under the knee. Place it under the heel.    Complete by:  As directed      Do not sit on low chairs, stoools or toilet seats, as it may be difficult to get up from low surfaces    Complete by:  As directed      Driving restrictions    Complete by:  As directed   No driving until released by the physician.     Increase activity slowly as tolerated    Complete by:  As directed      Lifting restrictions    Complete by:  As directed   No lifting until released by the physician.     Patient may shower    Complete by:  As directed   You may shower without a dressing once there is no drainage.  Do not wash over the wound.  If drainage remains, do not shower until  drainage stops.     TED hose    Complete by:  As directed   Use stockings (TED hose) for 3 weeks on both leg(s).  You may remove them at night for sleeping.     Weight bearing as tolerated    Complete by:  As directed   Laterality:  right  Extremity:  Lower            Medication List    STOP taking these medications        COSAMIN ASU ADVANCED FORMULA PO     diclofenac sodium 1 % Gel  Commonly known as:  VOLTAREN     potassium chloride 10 MEQ tablet  Commonly known as:  K-DUR      TAKE these medications        acetaminophen 500 MG tablet  Commonly known as:  TYLENOL  Take 1,000 mg by mouth daily after lunch.     aspirin 81 MG EC tablet  Take 81 mg by mouth at bedtime.     carboxymethylcellulose 0.5 % Soln  Commonly known as:  REFRESH PLUS  Place 2 drops into both eyes 2 (two) times daily as needed (dry eyes.).     carvedilol 6.25 MG tablet  Commonly known as:  COREG  Take 1 tablet (6.25 mg total) by mouth 2 (two) times daily with a meal.     cholecalciferol 1000 UNITS tablet  Commonly known as:  VITAMIN D  Take 1,000 Units by mouth daily.     CITRACAL PO  Take 1 tablet by mouth 3 (three) times daily.     diazepam 5 MG tablet  Commonly known as:  VALIUM  Take 5 mg by mouth every 12 (twelve) hours as needed for anxiety.     docusate sodium 100 MG capsule  Commonly known as:  COLACE  Take 200 mg by mouth every evening.     enoxaparin 40 MG/0.4ML injection  Commonly known as:  LOVENOX  Inject 0.4 mLs (40 mg total) into the skin daily. Continue Lovenox injections until the INR is therapeutic at or greater than 2.0.  When INR reaches the therapeutic level of equal to or greater than 2.0, the patient may discontinue the Lovenox injections.     escitalopram 5 MG tablet  Commonly known as:  LEXAPRO  Take 5 mg by mouth every morning.     esomeprazole 40 MG  capsule  Commonly known as:  NEXIUM  Take 40 mg by mouth daily as needed (GERD).     fexofenadine 180  MG tablet  Commonly known as:  ALLEGRA  Take 180 mg by mouth daily.     Flax Seeds Powd  Take 1 scoop by mouth daily.     furosemide 20 MG tablet  Commonly known as:  LASIX  Take 1 tablet (20 mg total) by mouth daily.     HYDROmorphone 2 MG tablet  Commonly known as:  DILAUDID  Take 1-2 tablets (2-4 mg total) by mouth every 4 (four) hours as needed for moderate pain or severe pain.     levothyroxine 75 MCG tablet  Commonly known as:  SYNTHROID, LEVOTHROID  Take 1 tablet (75 mcg total) by mouth daily before breakfast.     magnesium oxide 400 MG tablet  Commonly known as:  MAG-OX  Take 400 mg by mouth daily after lunch.     meclizine 25 MG tablet  Commonly known as:  ANTIVERT  Take 25 mg by mouth 2 (two) times daily as needed for dizziness.     methocarbamol 500 MG tablet  Commonly known as:  ROBAXIN  Take 1 tablet (500 mg total) by mouth every 6 (six) hours as needed for muscle spasms.     metoprolol tartrate 25 MG tablet  Commonly known as:  LOPRESSOR  Take 12.5 mg by mouth 2 (two) times daily.     NASONEX 50 MCG/ACT nasal spray  Generic drug:  mometasone  Place 2 sprays into the nose at bedtime.     nitroGLYCERIN 0.4 MG SL tablet  Commonly known as:  NITROSTAT  Place 1 tablet (0.4 mg total) under the tongue every 5 (five) minutes as needed for chest pain. May repeat three times     ondansetron 4 MG tablet  Commonly known as:  ZOFRAN  Take 1 tablet (4 mg total) by mouth every 6 (six) hours as needed for nausea.     pravastatin 40 MG tablet  Commonly known as:  PRAVACHOL  Take 40 mg by mouth daily.     ranitidine 150 MG tablet  Commonly known as:  ZANTAC  Take 150 mg by mouth at bedtime.     sodium chloride 0.65 % Soln nasal spray  Commonly known as:  OCEAN  Place 2 sprays into both nostrils as needed for congestion.     spironolactone 25 MG tablet  Commonly known as:  ALDACTONE  Take 25 mg by mouth every morning.     traMADol 50 MG tablet  Commonly  known as:  ULTRAM  Take 1-2 tablets (50-100 mg total) by mouth every 6 (six) hours as needed (mild pain).     vitamin B-12 1000 MCG tablet  Commonly known as:  CYANOCOBALAMIN  Take 1,000 mcg by mouth daily.     warfarin 5 MG tablet  Commonly known as:  COUMADIN  Take 1 tablet (5 mg total) by mouth daily. Take Coumadin for three weeks for postoperative protocol and then the patient may resume their previous Coumadin home regimen.  The dose may need to be adjusted based upon the INR.  Please follow the INR and titrate Coumadin dose for a therapeutic range between 2.0 and 3.0 INR.  After completing the three weeks of Coumadin, the patient may resume their previous Coumadin home regimen.     warfarin 1 MG tablet  Commonly known as:  COUMADIN  Take 1 tablet (1 mg total) by mouth  daily. Take Coumadin for three weeks for postoperative protocol and then the patient may resume their previous Coumadin home regimen.  The dose may need to be adjusted based upon the INR.  Please follow the INR and titrate Coumadin dose for a therapeutic range between 2.0 and 3.0 INR.  After completing the three weeks of Coumadin, the patient may resume their previous Coumadin home regimen.           Follow-up Information    Follow up with [name], MD On 12/28/2014.   Specialty:  Orthopedic Surgery   Why:  Call office ASAP at [phone] to setup appointment with Dr. Wynelle Link on Tuesday 12/28/2014.   Contact information:   [address] [place] [address] [phone]       Signed: Arlee Muslim, PA-C Orthopaedic Surgery 12/14/2014, 8:00 AM

## 2014-12-30 ENCOUNTER — Ambulatory Visit: Payer: Medicare Other | Attending: Orthopedic Surgery | Admitting: Physical Therapy

## 2014-12-30 DIAGNOSIS — R262 Difficulty in walking, not elsewhere classified: Secondary | ICD-10-CM | POA: Insufficient documentation

## 2014-12-30 DIAGNOSIS — Z96651 Presence of right artificial knee joint: Secondary | ICD-10-CM | POA: Diagnosis present

## 2014-12-30 NOTE — Patient Instructions (Signed)
All exercises provided were adapted from [email or web address]. Patient was provided a written handout with pictures as described. Any additional cues were manually entered in to handout and copied in to this document.  QUAD SET - KNEE EXTENSION STRETCH - SEATED  While sitting, tighten your top thigh muscle to press the back of your knee downward towards the ground.   You should feel a gentle stretch in the back of your knee.     SIT STAND - BOTH HANDS ASSIST  While seated in a chair, scoot forward towards the edge of the chair. Next, hold on to the arm rest with both hands for support and then raise up to standing.   TANDEM STANCE WITH SUPPORT  Stand in front of a chair, table or counter top for support. Then place the heel of one foot so that it is touching the toes of the other foot. Maintain your balance in this position.

## 2014-12-31 NOTE — Therapy (Signed)
[facility] [address], [place], [address] Phone: [phone]   Fax:  [phone]  Physical Therapy Evaluation  Patient Details  Name: [name] MRN: [id number] Date of Birth: 15-Aug-1940 No Data Recorded  Encounter Date: 12/30/2014      PT End of Session - 12/30/14 1628    Visit Number 1   Number of Visits 13   Date for PT Re-Evaluation 02/17/15   Authorization Type Medicare G code 1   Authorization Time Period 10   PT Start Time 1530   PT Stop Time 1630   PT Time Calculation (min) 60 min   Equipment Utilized During Treatment Gait belt   Activity Tolerance Patient tolerated treatment well   Behavior During Therapy [facility] for tasks assessed/performed      Past Medical History  Diagnosis Date  . Allergic rhinitis   . Breast cancer ([place]) 1999, 2008    s/p bilat mastectomy  . Pulmonary nodules     no change CT chest 3/10  . GERD (gastroesophageal reflux disease)   . HTN (hypertension)   . Hyperlipidemia   . NSVT (nonsustained ventricular tachycardia) ([place])     a. 05/2013 after MI->amio caused nausea->BB only.  . Hypothyroid   . Ischemic cardiomyopathy     a. 05/2013 Echo: EF 40-45%, midinferior AK.  . Takotsubo cardiomyopathy     a. 2013 with subsequent recovery of LV fxn (followed by MI in 05/2013 with LV dysfxn).  Marland Kitchen CAD (coronary artery disease)     a. H/o nonobs dzs and vasospasm with takotsubo CM;  b. 05/2013 Inf STEMI/Cath: LM nl, LAD nl, LCX nondom 100d (failed PCI), RCA dominant, nl, RPDA 5m (61mm vessel-->Med Rx), EF 35-40%.  . Coronary vasospasm ([place])     some chest pain "last chest pressure episode 12'15 -evaluated at [facility], multiple testing-ruleout MI, "thought anxiety"  . DVT of popliteal vein ([facility])     left knee- caused bilateral Pulmonary emboli" Coumadin"  . Depression   . Anxiety   . Pulmonary emboli ([place])     history of dx. 07-05-13 [facility]  . Myocardial infarction ([place]) 05/2013   . Arthritis   . Dysrhythmia     NSVT    Past Surgical History  Procedure Laterality Date  . Bilateral mastectomy  1999, 2008  . Nasal sinus surgery  1972  . Thyroidectomy  1965  . Tonsillectomy  1950s  . Cardiac catheterization  04/2011    [facility] in [place]  . Left heart catheterization with coronary angiogram Bilateral 05/24/2013    Procedure: LEFT HEART CATHETERIZATION WITH CORONARY ANGIOGRAM;  Surgeon: Lorretta Harp, MD;  Location: [facility] CATH LAB;  Service: Cardiovascular;  Laterality: Bilateral;  . Tonsillectomy    . Breast surgery      bilateral mastectomy  . Knee arthroscopy Right   . Partial knee arthroplasty Left 05/31/2014    Procedure: UNICOMPARTMENTAL LEFT KNEE;  Surgeon: Gaynelle Arabian, MD;  Location: [facility] ORS;  Service: Orthopedics;  Laterality: Left;  . Partial knee arthroplasty Right 12/13/2014    Procedure: RIGHT KNEE UNICOMPARTMENTAL ARTHROPLASTY;  Surgeon: Gaynelle Arabian, MD;  Location: [facility] ORS;  Service: Orthopedics;  Laterality: Right;    There were no vitals filed for this visit.  Visit Diagnosis:  Status post total right knee replacement - Plan: PT plan of care cert/re-cert  Difficulty walking - Plan: PT plan of care cert/re-cert      Subjective Assessment - 12/30/14 1537  Subjective Patient underwent uni-compartmental R TKR on 12/13/2014, she previously underwent L unicompartmental replacement in April of this year. She was discharged home with HHPT. She was just recently changed from RW to [facility]. She reports she has been walking to and from the mailbox, but Monday felt sick and has been limited since. She has been very compliant with HEP completing 2x per day.    Limitations Sitting;Walking;Standing;House hold activities   Patient Stated Goals To increase her ambulation tolerance and decrease pain.    Currently in Pain? Yes   Pain Score 3    Pain Location Knee   Pain Orientation Right   Pain Descriptors / Indicators Aching   Pain Type Surgical pain    Pain Onset More than a month ago   Pain Frequency Intermittent   Aggravating Factors  Excessive flexion/extension and weight bearing.    Pain Relieving Factors Laying in bed with knee in relative extension.    Multiple Pain Sites No            [facility] PT Assessment - 12/30/14 1611    Assessment   Medical Diagnosis --  R TKR    Precautions   Precautions Knee   Restrictions   Weight Bearing Restrictions Yes   RLE Weight Bearing Weight bearing as tolerated   Balance Screen   Has the patient fallen in the past 6 months No   Cognition   Overall Cognitive Status Within Functional Limits for tasks assessed   Observation/Other Assessments   Lower Extremity Functional Scale  29   Sit to Stand   Comments --  Requires hands to complete from chair   AROM   Right Knee Extension 8   Right Knee Flexion 90   Strength   Right Hip Flexion 5/5   Left Hip Flexion 5/5   Right Knee Flexion --  5/5 in range available   Right Knee Extension 5/5  in range available   Left Knee Flexion 5/5   Left Knee Extension 5/5   Right Ankle Dorsiflexion 5/5   Right Ankle Plantar Flexion 5/5   Left Ankle Dorsiflexion 5/5   Left Ankle Plantar Flexion 5/5   Ambulation/Gait   Ambulation/Gait Yes   Ambulation/Gait Assistance 6: Modified independent (Device/Increase time)   Assistive device Straight cane   Standardized Balance Assessment   10 Meter Walk 20.5   Berg Balance Test   Sit to Stand Able to stand  independently using hands   Standing Unsupported Able to stand 2 minutes with supervision   Sitting with Back Unsupported but Feet Supported on Floor or Stool Able to sit safely and securely 2 minutes   Stand to Sit Controls descent by using hands   Transfers Able to transfer safely, definite need of hands   Standing Unsupported with Eyes Closed Able to stand 10 seconds with supervision   Standing Ubsupported with Feet Together Able to place feet together independently and stand 1 minute safely    From Standing, Reach Forward with Outstretched Arm Can reach confidently >25 cm (10")   From Standing Position, Pick up Object from Floor Able to pick up shoe, needs supervision   From Standing Position, Turn to Look Behind Over each Shoulder Turn sideways only but maintains balance   Turn 360 Degrees Able to turn 360 degrees safely in 4 seconds or less   Standing Unsupported, Alternately Place Feet on Step/Stool Able to stand independently and safely and complete 8 steps in 20 seconds   Standing Unsupported, One Foot in  Front Loses balance while stepping or standing   Standing on One Leg Tries to lift leg/unable to hold 3 seconds but remains standing independently   Total Score 41   Dynamic Gait Index   Level Surface Mild Impairment   Change in Gait Speed Mild Impairment   Gait with Horizontal Head Turns Mild Impairment   Gait with Vertical Head Turns Mild Impairment   Gait and Pivot Turn Normal   Step Over Obstacle Normal   Step Around Obstacles Normal   Steps Mild Impairment   Total Score 19   Timed Up and Go Test   Normal TUG (seconds) 12.5      Sit to stand training x 10 repetitions cuing for using a pillow underneath her to complete transfer more efficiently secondary to decreased LE strength  Discussed current HEP with patient consisting of heel raises, mini squats, LAQs, heel slides, standing hip abductions, standing hip marching educated patient to continue this as tolerated.  Tandem stance training bilaterally x 5 repetitions, modified to slightly offset foot stance for safety  Adjusted SPC height and observed ambulation x20' improved gait pattern with decreased antalgic pattern noted afterward.                      PT Education - 12/30/14 1627    Education provided Yes   Education Details To use RW in town, cane in the home. Balance exercises and technique.    Person(s) Educated Patient   Methods Explanation;Demonstration;Verbal cues;Handout    Comprehension Verbalized understanding;Returned demonstration             PT Long Term Goals - 12/31/14 0903    PT LONG TERM GOAL #1   Title Patient will report an LEFS score greater than 40 to demonstrate improved tolerance for ADLs and functional activity.   Baseline 29   Time 6   Period Weeks   Status New   PT LONG TERM GOAL #2   Title Patient will demonstrate a BERG balance score of greater than 48/56 to demonstrate increased safety with mobility.   Baseline 41   Time 6   Period Weeks   Status New   PT LONG TERM GOAL #3   Title Patient will demonstrate a DGI score of greater than 24/30 to demonstrate decreased risk of falling with ambulation.    Baseline 19   Time 6   Period Weeks   Status New   PT LONG TERM GOAL #4   Title Patient will demonstrate a TUG score of less than 13 seconds with no AD to demonstrate improved ambulatory balance and safety.    Baseline 12.5 seconds with SPC   Time 6   Period Weeks   Status New               Plan - 12/30/14 1629    Clinical Impression Statement Patient is roughly 2 weeks s/p R unilateral compartmental R knee arthroplasty. She presents with antalgic gait pattern, decreased ROM, and high falls risk currently. Patient is very eager to progress with therapy and completes her HEP from [place] 2-3x per day. Strength appears appropriate for this time frame, ROM 8-90 degrees actively during this session. Patient demonstrates decreased gait speed, balance, and transfer skills and would benefit from additional PT services to maximize her mobility.    Pt will benefit from skilled therapeutic intervention in order to improve on the following deficits Abnormal gait;Decreased activity tolerance;Decreased balance;Decreased strength;Decreased mobility;Decreased knowledge of use of DME;Decreased endurance;Difficulty  walking;Impaired flexibility   Rehab Potential Good   Clinical Impairments Affecting Rehab Potential Very motivated    PT  Frequency 2x / week   PT Duration 6 weeks   PT Treatment/Interventions Moist Heat;Therapeutic exercise;Therapeutic activities;Manual techniques;Balance training;Stair training;Gait training;Neuromuscular re-education   PT Next Visit Plan Manual therapy to increase flexion, extension, LE strengthening and balance activities.    PT Home Exercise Plan See patient instructions   Consulted and Agree with Plan of Care Patient          G-Codes - January 03, 2015 1633    Functional Assessment Tool Used TUG, Berg Balance, 46m walk, clinical judgement    Functional Limitation Mobility: Walking and moving around   Mobility: Walking and Moving Around Current Status [phone]) At least 20 percent but less than 40 percent impaired, limited or restricted   Mobility: Walking and Moving Around Goal Status [phone]) At least 1 percent but less than 20 percent impaired, limited or restricted       Problem List Patient Active Problem List   Diagnosis Date Noted  . OA (osteoarthritis) of knee 05/31/2014  . Hyperlipemia 06/08/2013  . Ischemic cardiomyopathy 05/27/2013  . Hypotension 05/27/2013  . VT (ventricular tachycardia) ([place]) 05/25/2013  . Hypokalemia 05/25/2013  . Hypothyroid   . STEMI (ST elevation myocardial infarction) ([place]) 05/24/2013  . Preoperative clearance 02/27/2012  . Takotsubo cardiomyopathy 05/15/2011  . Coronary artery disease 10/06/2010  . ASTHMA, EXTRINSIC 07/15/2008  . BREAST CANCER 06/07/2008  . HYPERLIPIDEMIA 06/07/2008  . HYPERTENSION 06/07/2008  . MYOCARDIAL INFARCTION 06/07/2008  . ALLERGIC RHINITIS 06/07/2008  . G E R D 06/07/2008  . DYSPNEA 06/07/2008   Kerman Passey, PT, DPT    12/31/2014, 3:19 PM  [facility] [address] [place], [address] Phone: [phone]   Fax:  [phone]  Name: [name] MRN: [id number] Date of Birth: [birth year]

## 2015-01-03 ENCOUNTER — Ambulatory Visit: Payer: Medicare Other | Admitting: Physical Therapy

## 2015-01-03 DIAGNOSIS — Z96651 Presence of right artificial knee joint: Secondary | ICD-10-CM | POA: Diagnosis not present

## 2015-01-03 DIAGNOSIS — R262 Difficulty in walking, not elsewhere classified: Secondary | ICD-10-CM

## 2015-01-03 NOTE — Therapy (Signed)
[facility] [address], [place], [address] Phone: [phone]   Fax:  [phone]  Physical Therapy Treatment  Patient Details  Name: [name] MRN: [id number] Date of Birth: 1940/06/12 No Data Recorded  Encounter Date: 01/03/2015    Past Medical History  Diagnosis Date  . Allergic rhinitis   . Breast cancer ([place]) 1999, 2008    s/p bilat mastectomy  . Pulmonary nodules     no change CT chest 3/10  . GERD (gastroesophageal reflux disease)   . HTN (hypertension)   . Hyperlipidemia   . NSVT (nonsustained ventricular tachycardia) ([place])     a. 05/2013 after MI->amio caused nausea->BB only.  . Hypothyroid   . Ischemic cardiomyopathy     a. 05/2013 Echo: EF 40-45%, midinferior AK.  . Takotsubo cardiomyopathy     a. 2013 with subsequent recovery of LV fxn (followed by MI in 05/2013 with LV dysfxn).  Marland Kitchen CAD (coronary artery disease)     a. H/o nonobs dzs and vasospasm with takotsubo CM;  b. 05/2013 Inf STEMI/Cath: LM nl, LAD nl, LCX nondom 100d (failed PCI), RCA dominant, nl, RPDA 23m (43mm vessel-->Med Rx), EF 35-40%.  . Coronary vasospasm ([place])     some chest pain "last chest pressure episode 12'15 -evaluated at [facility], multiple testing-ruleout MI, "thought anxiety"  . DVT of popliteal vein ([facility])     left knee- caused bilateral Pulmonary emboli" Coumadin"  . Depression   . Anxiety   . Pulmonary emboli ([place])     history of dx. 07-05-13 [facility]  . Myocardial infarction ([place]) 05/2013  . Arthritis   . Dysrhythmia     NSVT    Past Surgical History  Procedure Laterality Date  . Bilateral mastectomy  1999, 2008  . Nasal sinus surgery  1972  . Thyroidectomy  1965  . Tonsillectomy  1950s  . Cardiac catheterization  04/2011    [facility] in [place]  . Left heart catheterization with coronary angiogram Bilateral 05/24/2013    Procedure: LEFT HEART CATHETERIZATION WITH CORONARY ANGIOGRAM;  Surgeon: Lorretta Harp, MD;  Location: [facility] CATH LAB;  Service: Cardiovascular;  Laterality: Bilateral;  . Tonsillectomy    . Breast surgery      bilateral mastectomy  . Knee arthroscopy Right   . Partial knee arthroplasty Left 05/31/2014    Procedure: UNICOMPARTMENTAL LEFT KNEE;  Surgeon: Gaynelle Arabian, MD;  Location: [facility];  Service: Orthopedics;  Laterality: Left;  . Partial knee arthroplasty Right 12/13/2014    Procedure: RIGHT KNEE UNICOMPARTMENTAL ARTHROPLASTY;  Surgeon: Gaynelle Arabian, MD;  Location: [facility]S;  Service: Orthopedics;  Laterality: Right;    There were no vitals filed for this visit.  Visit Diagnosis:  Status post total right knee replacement  Difficulty walking      Subjective Assessment - 01/03/15 1536    Subjective Pt reports he has incr. nausea from her pain medications. Pain is typical.    Limitations Sitting;Walking;Standing;House hold activities   Patient Stated Goals To increase her ambulation tolerance and decrease pain.    Currently in Pain? Yes   Pain Score 3    Pain Location Knee   Pain Orientation Right   Pain Onset More than a month ago        Objective: Inspected incision site, C/D/I  Patellar mobs 3x1 min grade II.  STM performed on VMO with palpable trigger point release, 5 min total.  Sustained knee extension manual stretching 10  min total.  Sustained knee flexion stretching x10 min total.  Seated orange ball rolls focusing on knee flexion 5x10  Educated pt on the importance of stretching over strengthening at this stage of her recovery to address ROM.                           PT Education - 01/03/15 1540    Education provided Yes   Education Details mobilization exercises for knee flexion/extension.              PT Long Term Goals - 12/31/14 0903    PT LONG TERM GOAL #1   Title Patient will report an LEFS score greater than 40 to demonstrate improved tolerance for ADLs and functional activity.   Baseline 29    Time 6   Period Weeks   Status New   PT LONG TERM GOAL #2   Title Patient will demonstrate a BERG balance score of greater than 48/56 to demonstrate increased safety with mobility.   Baseline 41   Time 6   Period Weeks   Status New   PT LONG TERM GOAL #3   Title Patient will demonstrate a DGI score of greater than 24/30 to demonstrate decreased risk of falling with ambulation.    Baseline 19   Time 6   Period Weeks   Status New   PT LONG TERM GOAL #4   Title Patient will demonstrate a TUG score of less than 13 seconds with no AD to demonstrate improved ambulatory balance and safety.    Baseline 12.5 seconds with SPC   Time 6   Period Weeks   Status New               Plan - 01/03/15 1541    Clinical Impression Statement Pt is progressing appropriately, significant antalgia with gait, but pt tolerated manual tx well. Stiffness and pain are typical for post surgical pain.   Pt will benefit from skilled therapeutic intervention in order to improve on the following deficits Abnormal gait;Decreased activity tolerance;Decreased balance;Decreased strength;Decreased mobility;Decreased knowledge of use of DME;Decreased endurance;Difficulty walking;Impaired flexibility   Rehab Potential Good   Clinical Impairments Affecting Rehab Potential Very motivated    PT Frequency 2x / week   PT Duration 6 weeks   PT Treatment/Interventions Moist Heat;Therapeutic exercise;Therapeutic activities;Manual techniques;Balance training;Stair training;Gait training;Neuromuscular re-education   PT Next Visit Plan Manual therapy to increase flexion, extension, LE strengthening and balance activities.         Problem List Patient Active Problem List   Diagnosis Date Noted  . OA (osteoarthritis) of knee 05/31/2014  . Hyperlipemia 06/08/2013  . Ischemic cardiomyopathy 05/27/2013  . Hypotension 05/27/2013  . VT (ventricular tachycardia) ([place]) 05/25/2013  . Hypokalemia 05/25/2013  . Hypothyroid   .  STEMI (ST elevation myocardial infarction) ([place]) 05/24/2013  . Preoperative clearance 02/27/2012  . Takotsubo cardiomyopathy 05/15/2011  . Coronary artery disease 10/06/2010  . ASTHMA, EXTRINSIC 07/15/2008  . BREAST CANCER 06/07/2008  . HYPERLIPIDEMIA 06/07/2008  . HYPERTENSION 06/07/2008  . MYOCARDIAL INFARCTION 06/07/2008  . ALLERGIC RHINITIS 06/07/2008  . G E R D 06/07/2008  . DYSPNEA 06/07/2008    Taron Conrey PT 01/03/2015, 5:05 PM  [facility] [address] [place], [address] Phone: [phone]   Fax:  [phone]  Name: [name] MRN: [id number] Date of Birth: 1940/06/10

## 2015-01-06 ENCOUNTER — Ambulatory Visit: Payer: Medicare Other | Admitting: Physical Therapy

## 2015-01-06 DIAGNOSIS — Z96651 Presence of right artificial knee joint: Secondary | ICD-10-CM | POA: Diagnosis not present

## 2015-01-06 DIAGNOSIS — R262 Difficulty in walking, not elsewhere classified: Secondary | ICD-10-CM

## 2015-01-06 NOTE — Therapy (Signed)
[place] [facility] [address] [place], [address] Phone: [phone]   Fax:  [phone]  Physical Therapy Treatment  Patient Details  Name: [name] MRN: [id number] Date of Birth: 02/02/1941 No Data Recorded  Encounter Date: 01/06/2015      PT End of Session - 01/06/15 1655    Visit Number 2   Number of Visits 13   Date for PT Re-Evaluation 02/17/15   Authorization Type Medicare G code 1   PT Start Time 1600   PT Stop Time [id number]   PT Time Calculation (min) 45 min   Activity Tolerance Patient tolerated treatment well;No increased pain   Behavior During Therapy [facility] for tasks assessed/performed      Past Medical History  Diagnosis Date  . Allergic rhinitis   . Breast cancer ([place]) 1999, 2008    s/p bilat mastectomy  . Pulmonary nodules     no change CT chest 3/10  . GERD (gastroesophageal reflux disease)   . HTN (hypertension)   . Hyperlipidemia   . NSVT (nonsustained ventricular tachycardia) ([place])     a. 05/2013 after MI->amio caused nausea->BB only.  . Hypothyroid   . Ischemic cardiomyopathy     a. 05/2013 Echo: EF 40-45%, midinferior AK.  . Takotsubo cardiomyopathy     a. 2013 with subsequent recovery of LV fxn (followed by MI in 05/2013 with LV dysfxn).  Marland Kitchen CAD (coronary artery disease)     a. H/o nonobs dzs and vasospasm with takotsubo CM;  b. 05/2013 Inf STEMI/Cath: LM nl, LAD nl, LCX nondom 100d (failed PCI), RCA dominant, nl, RPDA 62m (8mm vessel-->Med Rx), EF 35-40%.  . Coronary vasospasm ([place])     some chest pain "last chest pressure episode 12'15 -evaluated at [facility], multiple testing-ruleout MI, "thought anxiety"  . DVT of popliteal vein ([facility])     left knee- caused bilateral Pulmonary emboli" Coumadin"  . Depression   . Anxiety   . Pulmonary emboli ([place])     history of dx. 07-05-13 [facility]  . Myocardial infarction ([place]) 05/2013  . Arthritis   . Dysrhythmia     NSVT    Past Surgical  History  Procedure Laterality Date  . Bilateral mastectomy  1999, 2008  . Nasal sinus surgery  1972  . Thyroidectomy  1965  . Tonsillectomy  1950s  . Cardiac catheterization  04/2011    [facility] in [place]  . Left heart catheterization with coronary angiogram Bilateral 05/24/2013    Procedure: LEFT HEART CATHETERIZATION WITH CORONARY ANGIOGRAM;  Surgeon: Lorretta Harp, MD;  Location: [facility] CATH LAB;  Service: Cardiovascular;  Laterality: Bilateral;  . Tonsillectomy    . Breast surgery      bilateral mastectomy  . Knee arthroscopy Right   . Partial knee arthroplasty Left 05/31/2014    Procedure: UNICOMPARTMENTAL LEFT KNEE;  Surgeon: Gaynelle Arabian, MD;  Location: [facility] ORS;  Service: Orthopedics;  Laterality: Left;  . Partial knee arthroplasty Right 12/13/2014    Procedure: RIGHT KNEE UNICOMPARTMENTAL ARTHROPLASTY;  Surgeon: Gaynelle Arabian, MD;  Location: [facility] ORS;  Service: Orthopedics;  Laterality: Right;    There were no vitals filed for this visit.  Visit Diagnosis:  Status post total right knee replacement  Difficulty walking      Subjective Assessment - 01/06/15 1652    Subjective Patient reports she is feeling stiff, but feeling better than earlier this week.    Limitations Sitting;Walking;Standing;House hold activities   Patient Stated  Goals To increase her ambulation tolerance and decrease pain.    Currently in Pain? Yes   Pain Score 3    Pain Location Knee   Pain Orientation Right   Pain Descriptors / Indicators Aching   Pain Type Surgical pain   Pain Onset More than a month ago   Pain Frequency Intermittent   Multiple Pain Sites No          Objective: Inspected incision site, C/D/I  Patellar mobs 3x1 min grade II.  Sustained knee extension manual stretching 10 min total.  Sustained knee flexion stretching x10 min total.  Performed standing LE exercises to include: marching, squats, heel/toe raises, knee flexion, hip abduction x 15 reps.  Educated patient  on positioning at home for stretch, ice use at home, performing SLR at home with good quad contraction.   Educated pt on the importance of stretching over strengthening at this stage of her recovery to address ROM.              PT Education - 01/06/15 1654    Education provided Yes   Education Details exercises, proper form.    Person(s) Educated Patient   Methods Explanation;Demonstration;Verbal cues   Comprehension Verbalized understanding;Returned demonstration;Verbal cues required             PT Long Term Goals - 12/31/14 0903    PT LONG TERM GOAL #1   Title Patient will report an LEFS score greater than 40 to demonstrate improved tolerance for ADLs and functional activity.   Baseline 29   Time 6   Period Weeks   Status New   PT LONG TERM GOAL #2   Title Patient will demonstrate a BERG balance score of greater than 48/56 to demonstrate increased safety with mobility.   Baseline 41   Time 6   Period Weeks   Status New   PT LONG TERM GOAL #3   Title Patient will demonstrate a DGI score of greater than 24/30 to demonstrate decreased risk of falling with ambulation.    Baseline 19   Time 6   Period Weeks   Status New   PT LONG TERM GOAL #4   Title Patient will demonstrate a TUG score of less than 13 seconds with no AD to demonstrate improved ambulatory balance and safety.    Baseline 12.5 seconds with SPC   Time 6   Period Weeks   Status New               Plan - 01/06/15 1657    Clinical Impression Statement Patient is progressing well with therapy. Patient has stiffness in right knee.    Pt will benefit from skilled therapeutic intervention in order to improve on the following deficits Abnormal gait;Decreased activity tolerance;Decreased balance;Decreased strength;Decreased mobility;Decreased knowledge of use of DME;Decreased endurance;Difficulty walking;Impaired flexibility   Rehab Potential Good   PT Frequency 2x / week   PT Duration 6 weeks    PT Treatment/Interventions Moist Heat;Therapeutic exercise;Therapeutic activities;Manual techniques;Balance training;Stair training;Gait training;Neuromuscular re-education   PT Next Visit Plan Manual therapy to increase flexion, extension, LE strengthening and balance activities.    PT Home Exercise Plan See patient instructions   Consulted and Agree with Plan of Care Patient        Problem List Patient Active Problem List   Diagnosis Date Noted  . OA (osteoarthritis) of knee 05/31/2014  . Hyperlipemia 06/08/2013  . Ischemic cardiomyopathy 05/27/2013  . Hypotension 05/27/2013  . VT (ventricular tachycardia) ([place]) 05/25/2013  .  Hypokalemia 05/25/2013  . Hypothyroid   . STEMI (ST elevation myocardial infarction) ([place]) 05/24/2013  . Preoperative clearance 02/27/2012  . Takotsubo cardiomyopathy 05/15/2011  . Coronary artery disease 10/06/2010  . ASTHMA, EXTRINSIC 07/15/2008  . BREAST CANCER 06/07/2008  . HYPERLIPIDEMIA 06/07/2008  . HYPERTENSION 06/07/2008  . MYOCARDIAL INFARCTION 06/07/2008  . ALLERGIC RHINITIS 06/07/2008  . G E R D 06/07/2008  . DYSPNEA 06/07/2008    Alastor Kneale, PT, MPT, GCS 01/06/2015, 5:01 PM  [facility] [facility] [address], [place], [address] Phone: [phone]   Fax:  [phone]  Name: AXIE KNOCHE MRN: [id number] Date of Birth: Jul 18, 1940

## 2015-01-11 ENCOUNTER — Ambulatory Visit: Payer: Medicare Other | Admitting: Physical Therapy

## 2015-01-11 DIAGNOSIS — Z96651 Presence of right artificial knee joint: Secondary | ICD-10-CM | POA: Diagnosis not present

## 2015-01-11 DIAGNOSIS — R262 Difficulty in walking, not elsewhere classified: Secondary | ICD-10-CM

## 2015-01-11 NOTE — Therapy (Signed)
[facility] [address], [place], [address] Phone: [phone]   Fax:  [phone]  Physical Therapy Treatment  Patient Details  Name: [name] MRN: [id number] Date of Birth: April 03, 1940 No Data Recorded  Encounter Date: 01/11/2015      PT End of Session - 01/11/15 1025    Visit Number 3   Number of Visits 13   Date for PT Re-Evaluation 02/17/15   Authorization Type Medicare G code 1   Authorization - Visit Number 3   PT Start Time 1000   PT Stop Time [id number]   PT Time Calculation (min) 47 min   Activity Tolerance Patient tolerated treatment well;No increased pain   Behavior During Therapy [facility] for tasks assessed/performed      Past Medical History  Diagnosis Date  . Allergic rhinitis   . Breast cancer ([place]) 1999, 2008    s/p bilat mastectomy  . Pulmonary nodules     no change CT chest 3/10  . GERD (gastroesophageal reflux disease)   . HTN (hypertension)   . Hyperlipidemia   . NSVT (nonsustained ventricular tachycardia) ([place])     a. 05/2013 after MI->amio caused nausea->BB only.  . Hypothyroid   . Ischemic cardiomyopathy     a. 05/2013 Echo: EF 40-45%, midinferior AK.  . Takotsubo cardiomyopathy     a. 2013 with subsequent recovery of LV fxn (followed by MI in 05/2013 with LV dysfxn).  Marland Kitchen CAD (coronary artery disease)     a. H/o nonobs dzs and vasospasm with takotsubo CM;  b. 05/2013 Inf STEMI/Cath: LM nl, LAD nl, LCX nondom 100d (failed PCI), RCA dominant, nl, RPDA 64m (58mm vessel-->Med Rx), EF 35-40%.  . Coronary vasospasm ([place])     some chest pain "last chest pressure episode 12'15 -evaluated at [facility], multiple testing-ruleout MI, "thought anxiety"  . DVT of popliteal vein ([facility])     left knee- caused bilateral Pulmonary emboli" Coumadin"  . Depression   . Anxiety   . Pulmonary emboli ([place])     history of dx. 07-05-13 [facility]  . Myocardial infarction ([place]) 05/2013  . Arthritis   . Dysrhythmia      NSVT    Past Surgical History  Procedure Laterality Date  . Bilateral mastectomy  1999, 2008  . Nasal sinus surgery  1972  . Thyroidectomy  1965  . Tonsillectomy  1950s  . Cardiac catheterization  04/2011    [facility] in [place]  . Left heart catheterization with coronary angiogram Bilateral 05/24/2013    Procedure: LEFT HEART CATHETERIZATION WITH CORONARY ANGIOGRAM;  Surgeon: Lorretta Harp, MD;  Location: [facility] CATH LAB;  Service: Cardiovascular;  Laterality: Bilateral;  . Tonsillectomy    . Breast surgery      bilateral mastectomy  . Knee arthroscopy Right   . Partial knee arthroplasty Left 05/31/2014    Procedure: UNICOMPARTMENTAL LEFT KNEE;  Surgeon: Gaynelle Arabian, MD;  [facility]: [facility];  Service: Orthopedics;  Laterality: Left;  . Partial knee arthroplasty Right 12/13/2014    Procedure: RIGHT KNEE UNICOMPARTMENTAL ARTHROPLASTY;  Surgeon: Gaynelle Arabian, MD;  Location: [facility];  Service: Orthopedics;  Laterality: Right;    There were no vitals filed for this visit.  Visit Diagnosis:  Status post total right knee replacement  Difficulty walking      Subjective Assessment - 01/11/15 1000    Subjective Pt reports she is doing better today, decr. pain with walking. She is feeling very weak which  she attributes to being very busy.   Limitations Sitting;Walking;Standing;House hold activities   Patient Stated Goals To increase her ambulation tolerance and decrease pain.    Currently in Pain? No/denies   Pain Score 0-No pain   Pain Onset More than a month ago        Objective: Supine sustained extension stretch, 5x1 min with gentle oscillations at end range. No change in ROM but decr. Pain at end range.  Seated knee flexion stretching with knee distraction 3x1 min. Same with 3x1 min PA. ROM improved from 85 to 95 degr.   Orange ball roll outs 3 min total.  2nd step stretch 5x1 min. For knee flexion - pt does not have stairs at home so can only perform in  clinic.  Deferred nu-step as pt feels this is not helpful.  Partial squats, limited to quarter squats as pt has difficulty with excess use of UE with squatting. With extensive practice able to perform well inconsistently.                         PT Education - 01/11/15 1025    Education provided Yes   Education Details avoiding lateral flexion when performing knee flexion stretch   Person(s) Educated Patient   Methods Explanation;Demonstration   Comprehension Verbalized understanding;Returned demonstration             PT Long Term Goals - 12/31/14 0903    PT LONG TERM GOAL #1   Title Patient will report an LEFS score greater than 40 to demonstrate improved tolerance for ADLs and functional activity.   Baseline 29   Time 6   Period Weeks   Status New   PT LONG TERM GOAL #2   Title Patient will demonstrate a BERG balance score of greater than 48/56 to demonstrate increased safety with mobility.   Baseline 41   Time 6   Period Weeks   Status New   PT LONG TERM GOAL #3   Title Patient will demonstrate a DGI score of greater than 24/30 to demonstrate decreased risk of falling with ambulation.    Baseline 19   Time 6   Period Weeks   Status New   PT LONG TERM GOAL #4   Title Patient will demonstrate a TUG score of less than 13 seconds with no AD to demonstrate improved ambulatory balance and safety.    Baseline 12.5 seconds with SPC   Time 6   Period Weeks   Status New               Plan - 01/11/15 1036    Clinical Impression Statement pt continues to make progress, is limited in ROM, gait, and muscle weakness.   Pt will benefit from skilled therapeutic intervention in order to improve on the following deficits Abnormal gait;Decreased activity tolerance;Decreased balance;Decreased strength;Decreased mobility;Decreased knowledge of use of DME;Decreased endurance;Difficulty walking;Impaired flexibility   Rehab Potential Good   PT Frequency 2x /  week   PT Duration 6 weeks   PT Treatment/Interventions Moist Heat;Therapeutic exercise;Therapeutic activities;Manual techniques;Balance training;Stair training;Gait training;Neuromuscular re-education   PT Next Visit Plan Manual therapy to increase flexion, extension, LE strengthening and balance activities.    PT Home Exercise Plan See patient instructions   Consulted and Agree with Plan of Care Patient        Problem List Patient Active Problem List   Diagnosis Date Noted  . OA (osteoarthritis) of knee 05/31/2014  . Hyperlipemia 06/08/2013  .  Ischemic cardiomyopathy 05/27/2013  . Hypotension 05/27/2013  . VT (ventricular tachycardia) ([place]) 05/25/2013  . Hypokalemia 05/25/2013  . Hypothyroid   . STEMI (ST elevation myocardial infarction) ([place]) 05/24/2013  . Preoperative clearance 02/27/2012  . Takotsubo cardiomyopathy 05/15/2011  . Coronary artery disease 10/06/2010  . ASTHMA, EXTRINSIC 07/15/2008  . BREAST CANCER 06/07/2008  . HYPERLIPIDEMIA 06/07/2008  . HYPERTENSION 06/07/2008  . MYOCARDIAL INFARCTION 06/07/2008  . ALLERGIC RHINITIS 06/07/2008  . G E R D 06/07/2008  . DYSPNEA 06/07/2008    Fisher,Benjamin PT 01/11/2015, 10:50 AM  [facility] [address], [place], [address] Phone: [phone]   Fax:  [phone]  Name: [name] MRN: [id number] Date of Birth: 07-16-1940

## 2015-01-18 ENCOUNTER — Ambulatory Visit: Payer: Medicare Other | Admitting: Physical Therapy

## 2015-01-18 DIAGNOSIS — Z96651 Presence of right artificial knee joint: Secondary | ICD-10-CM

## 2015-01-18 DIAGNOSIS — R262 Difficulty in walking, not elsewhere classified: Secondary | ICD-10-CM

## 2015-01-18 NOTE — Therapy (Signed)
[facility] [address] [place], [address] Phone: [phone]   Fax:  [phone]  Physical Therapy Treatment  Patient Details  Name: [name] MRN: [id number] Date of Birth: June 10, 1940 No Data Recorded  Encounter Date: 01/18/2015      PT End of Session - 01/18/15 1133    Visit Number 4   Number of Visits 13   Date for PT Re-Evaluation 02/17/15   Authorization Type G Code 4   Authorization Time Period 10   PT Start Time [phone]   PT Stop Time 1031   PT Time Calculation (min) 42 min   Equipment Utilized During Treatment Gait belt   Activity Tolerance Patient tolerated treatment well;No increased pain   Behavior During Therapy [facility] for tasks assessed/performed      Past Medical History  Diagnosis Date  . Allergic rhinitis   . Breast cancer ([place]) 1999, 2008    s/p bilat mastectomy  . Pulmonary nodules     no change CT chest 3/10  . GERD (gastroesophageal reflux disease)   . HTN (hypertension)   . Hyperlipidemia   . NSVT (nonsustained ventricular tachycardia) ([place])     a. 05/2013 after MI->amio caused nausea->BB only.  . Hypothyroid   . Ischemic cardiomyopathy     a. 05/2013 Echo: EF 40-45%, midinferior AK.  . Takotsubo cardiomyopathy     a. 2013 with subsequent recovery of LV fxn (followed by MI in 05/2013 with LV dysfxn).  Marland Kitchen CAD (coronary artery disease)     a. H/o nonobs dzs and vasospasm with takotsubo CM;  b. 05/2013 Inf STEMI/Cath: LM nl, LAD nl, LCX nondom 100d (failed PCI), RCA dominant, nl, RPDA 90m (90mm vessel-->Med Rx), EF 35-40%.  . Coronary vasospasm ([place])     some chest pain "last chest pressure episode 12'15 -evaluated at [facility], multiple testing-ruleout MI, "thought anxiety"  . DVT of popliteal vein ([facility])     left knee- caused bilateral Pulmonary emboli" Coumadin"  . Depression   . Anxiety   . Pulmonary emboli ([place])     history of dx. 07-05-13 [facility]  . Myocardial infarction ([place])  05/2013  . Arthritis   . Dysrhythmia     NSVT    Past Surgical History  Procedure Laterality Date  . Bilateral mastectomy  1999, 2008  . Nasal sinus surgery  1972  . Thyroidectomy  1965  . Tonsillectomy  1950s  . Cardiac catheterization  04/2011    [facility] in [place]  . Left heart catheterization with coronary angiogram Bilateral 05/24/2013    Procedure: LEFT HEART CATHETERIZATION WITH CORONARY ANGIOGRAM;  Surgeon: Lorretta Harp, MD;  Location: [facility] CATH LAB;  Service: Cardiovascular;  Laterality: Bilateral;  . Tonsillectomy    . Breast surgery      bilateral mastectomy  . Knee arthroscopy Right   . Partial knee arthroplasty Left 05/31/2014    Procedure: UNICOMPARTMENTAL LEFT KNEE;  Surgeon: Gaynelle Arabian, MD;  Location: [facility];  Service: Orthopedics;  Laterality: Left;  . Partial knee arthroplasty Right 12/13/2014    Procedure: RIGHT KNEE UNICOMPARTMENTAL ARTHROPLASTY;  Surgeon: Gaynelle Arabian, MD;  Location: [facility];  Service: Orthopedics;  Laterality: Right;    There were no vitals filed for this visit.  Visit Diagnosis:  Status post total right knee replacement  Difficulty walking      Subjective Assessment - 01/18/15 0952    Subjective Pt reports no falls and presents with no pain on this  session. She intermittently has pain and uses the RW, though she has weaned off ice and medications.    Limitations Sitting;Walking;Standing;House hold activities   Patient Stated Goals To increase her ambulation tolerance and decrease pain.    Currently in Pain? No/denies     Ambulation with no AD x 20' for 2 bouts cuing for completing heel strike with maximal knee extension  2nd step stretch 2 bouts x 15 repetitions with 5" holds. Began to increase soreness with prolonged repetitions.   TUG - 14.13 seconds with no AD   ROM measured 4-98 degrees   A-P mobilizations at distal tibia in sitting grade IV well tolerated 2 bouts x 45" with rest breaks between   A-P mobilization in  supine to proximal tibia with foot secured (grade IV) 2 bouts x 45" with rest breaks between   Supine P-A mobilizations to calcaneus and counter pressure to proximal tibia for extension mobilization. Grade III-IV  (felt pressure/pain in posterior aspect of knee, minimal increase and was able to tolerate 3 bouts x 30 seconds).   Quarter/half squats x 8 with cuing for wider foot stance, and decreasing weight shifting to LLE (increasing weight bearing through RLE).                             PT Education - 01/18/15 1131    Education provided Yes   Education Details Patient educated regarding her progress thus far with PT and subsequent progression towards ROM and strengthening.    Person(s) Educated Patient   Methods Explanation;Demonstration;Handout   Comprehension Verbalized understanding;Returned demonstration             PT Long Term Goals - 12/31/14 0903    PT LONG TERM GOAL #1   Title Patient will report an LEFS score greater than 40 to demonstrate improved tolerance for ADLs and functional activity.   Baseline 29   Time 6   Period Weeks   Status New   PT LONG TERM GOAL #2   Title Patient will demonstrate a BERG balance score of greater than 48/56 to demonstrate increased safety with mobility.   Baseline 41   Time 6   Period Weeks   Status New   PT LONG TERM GOAL #3   Title Patient will demonstrate a DGI score of greater than 24/30 to demonstrate decreased risk of falling with ambulation.    Baseline 19   Time 6   Period Weeks   Status New   PT LONG TERM GOAL #4   Title Patient will demonstrate a TUG score of less than 13 seconds with no AD to demonstrate improved ambulatory balance and safety.    Baseline 12.5 seconds with SPC   Time 6   Period Weeks   Status New               Plan - 01/18/15 1133    Clinical Impression Statement Patient demonstrates improvement in TUG balance measure, now demonstrating just below WNL w/o AD. She is  able to reciprocally ascend/descend stairs with HHA. Patient continues to demonstrate LE weakness and decreased ROM around her knee, impacting her gait (flexed knee at heel strike).    Pt will benefit from skilled therapeutic intervention in order to improve on the following deficits Abnormal gait;Decreased activity tolerance;Decreased balance;Decreased strength;Decreased mobility;Decreased knowledge of use of DME;Decreased endurance;Difficulty walking;Impaired flexibility   Rehab Potential Good   Clinical Impairments Affecting Rehab Potential Very motivated  PT Frequency 2x / week   PT Duration 6 weeks   PT Treatment/Interventions Moist Heat;Therapeutic exercise;Therapeutic activities;Manual techniques;Balance training;Stair training;Gait training;Neuromuscular re-education   PT Next Visit Plan Manual therapy to increase flexion, extension, LE strengthening and balance activities.    PT Home Exercise Plan See patient instructions   Consulted and Agree with Plan of Care Patient        Problem List Patient Active Problem List   Diagnosis Date Noted  . OA (osteoarthritis) of knee 05/31/2014  . Hyperlipemia 06/08/2013  . Ischemic cardiomyopathy 05/27/2013  . Hypotension 05/27/2013  . VT (ventricular tachycardia) ([place]) 05/25/2013  . Hypokalemia 05/25/2013  . Hypothyroid   . STEMI (ST elevation myocardial infarction) ([place]) 05/24/2013  . Preoperative clearance 02/27/2012  . Takotsubo cardiomyopathy 05/15/2011  . Coronary artery disease 10/06/2010  . ASTHMA, EXTRINSIC 07/15/2008  . BREAST CANCER 06/07/2008  . HYPERLIPIDEMIA 06/07/2008  . HYPERTENSION 06/07/2008  . MYOCARDIAL INFARCTION 06/07/2008  . ALLERGIC RHINITIS 06/07/2008  . G E R D 06/07/2008  . DYSPNEA 06/07/2008    Kerman Passey, PT, DPT    01/18/2015, 11:46 AM  [facility] [address], [place], [address] Phone: [phone]   Fax:   [phone]  Name: [name] MRN: [id number] Date of Birth: May 04, 1940

## 2015-01-21 ENCOUNTER — Ambulatory Visit: Payer: Medicare Other | Attending: Orthopedic Surgery | Admitting: Physical Therapy

## 2015-01-21 DIAGNOSIS — R262 Difficulty in walking, not elsewhere classified: Secondary | ICD-10-CM | POA: Insufficient documentation

## 2015-01-21 DIAGNOSIS — Z96651 Presence of right artificial knee joint: Secondary | ICD-10-CM | POA: Insufficient documentation

## 2015-01-21 NOTE — Therapy (Signed)
[place] [facility] [address], [place], [address] Phone: [phone]   Fax:  [phone]  Physical Therapy Treatment  Patient Details  Name: [name] MRN: [id number] Date of Birth: 1940-08-31 No Data Recorded  Encounter Date: 01/21/2015      PT End of Session - 01/21/15 0955    Visit Number 5   Number of Visits 13   Date for PT Re-Evaluation 02/17/15   Authorization Type G Code 5   Authorization Time Period 10   Authorization - Visit Number 5   PT Start Time [phone]   PT Stop Time 1036   PT Time Calculation (min) 47 min   Equipment Utilized During Treatment Gait belt   Activity Tolerance Patient tolerated treatment well;No increased pain   Behavior During Therapy [facility] for tasks assessed/performed      Past Medical History  Diagnosis Date  . Allergic rhinitis   . Breast cancer ([place]) 1999, 2008    s/p bilat mastectomy  . Pulmonary nodules     no change CT chest 3/10  . GERD (gastroesophageal reflux disease)   . HTN (hypertension)   . Hyperlipidemia   . NSVT (nonsustained ventricular tachycardia) ([place])     a. 05/2013 after MI->amio caused nausea->BB only.  . Hypothyroid   . Ischemic cardiomyopathy     a. 05/2013 Echo: EF 40-45%, midinferior AK.  . Takotsubo cardiomyopathy     a. 2013 with subsequent recovery of LV fxn (followed by MI in 05/2013 with LV dysfxn).  Marland Kitchen CAD (coronary artery disease)     a. H/o nonobs dzs and vasospasm with takotsubo CM;  b. 05/2013 Inf STEMI/Cath: LM nl, LAD nl, LCX nondom 100d (failed PCI), RCA dominant, nl, RPDA 9m (25mm vessel-->Med Rx), EF 35-40%.  . Coronary vasospasm ([place])     some chest pain "last chest pressure episode 12'15 -evaluated at [facility], multiple testing-ruleout MI, "thought anxiety"  . DVT of popliteal vein ([facility])     left knee- caused bilateral Pulmonary emboli" Coumadin"  . Depression   . Anxiety   . Pulmonary emboli ([place])     history of dx. 07-05-13 [facility]   . Myocardial infarction ([place]) 05/2013  . Arthritis   . Dysrhythmia     NSVT    Past Surgical History  Procedure Laterality Date  . Bilateral mastectomy  1999, 2008  . Nasal sinus surgery  1972  . Thyroidectomy  1965  . Tonsillectomy  1950s  . Cardiac catheterization  04/2011    [facility] in [place]  . Left heart catheterization with coronary angiogram Bilateral 05/24/2013    Procedure: LEFT HEART CATHETERIZATION WITH CORONARY ANGIOGRAM;  Surgeon: Lorretta Harp, MD;  Location: [facility] CATH LAB;  Service: Cardiovascular;  Laterality: Bilateral;  . Tonsillectomy    . Breast surgery      bilateral mastectomy  . Knee arthroscopy Right   . Partial knee arthroplasty Left 05/31/2014    Procedure: UNICOMPARTMENTAL LEFT KNEE;  Surgeon: Gaynelle Arabian, MD;  Location: [facility] ORS;  Service: Orthopedics;  Laterality: Left;  . Partial knee arthroplasty Right 12/13/2014    Procedure: RIGHT KNEE UNICOMPARTMENTAL ARTHROPLASTY;  Surgeon: Gaynelle Arabian, MD;  Location: [facility]S;  Service: Orthopedics;  Laterality: Right;    There were no vitals filed for this visit.  Visit Diagnosis:  Difficulty walking  Status post total right knee replacement      Subjective Assessment - 01/21/15 0952    Subjective pt was seen by  MD who was pleased by her progress and would like her to continue to focus on improving ROM.   Limitations Sitting;Walking;Standing;House hold activities   Patient Stated Goals To increase her ambulation tolerance and decrease pain.    Currently in Pain? Yes   Pain Score 2    Pain Location Knee   Pain Orientation Right         Objective: Focus of session on strengthening and scar massage.  Half squats with cuing for WB through RLE, 3x15 with 3 sec. Holds.  Heel raises 3x10 - pt had significant difficulty with this with knees locked out so cued this.  Step ups on 8" step with cuing to avoid ballistic push up and cuing to slow down on eccentric component.  Stair stretch 3x1 min  on R.  Amb in clinic with cuing to maintain more narrow BOS and incr. Speed. x3 min.  Scar massage (approximation massage) x6 min focusing on most superior and inferior portions as these were most palpably thickened.  Sustained extension stretch (PROM stretch) 5 min total.                          PT Education - 01/21/15 0954    Education provided Yes   Education Details Pt educated regarding continued HEP   Person(s) Educated Patient   Methods Explanation;Demonstration   Comprehension Verbalized understanding;Returned demonstration             PT Long Term Goals - 12/31/14 0903    PT LONG TERM GOAL #1   Title Patient will report an LEFS score greater than 40 to demonstrate improved tolerance for ADLs and functional activity.   Baseline 29   Time 6   Period Weeks   Status New   PT LONG TERM GOAL #2   Title Patient will demonstrate a BERG balance score of greater than 48/56 to demonstrate increased safety with mobility.   Baseline 41   Time 6   Period Weeks   Status New   PT LONG TERM GOAL #3   Title Patient will demonstrate a DGI score of greater than 24/30 to demonstrate decreased risk of falling with ambulation.    Baseline 19   Time 6   Period Weeks   Status New   PT LONG TERM GOAL #4   Title Patient will demonstrate a TUG score of less than 13 seconds with no AD to demonstrate improved ambulatory balance and safety.    Baseline 12.5 seconds with SPC   Time 6   Period Weeks   Status New               Plan - 01/21/15 1006    Clinical Impression Statement Pt is making improvement in AROM for knee and is able to perform step ups on 8" step, has some difficulty with controled return and would benefit from continued focus on femoral control. Decr. antalgia with gait.    Pt will benefit from skilled therapeutic intervention in order to improve on the following deficits Abnormal gait;Decreased activity tolerance;Decreased balance;Decreased  strength;Decreased mobility;Decreased knowledge of use of DME;Decreased endurance;Difficulty walking;Impaired flexibility   Rehab Potential Good   Clinical Impairments Affecting Rehab Potential Very motivated    PT Frequency 2x / week   PT Duration 6 weeks   PT Treatment/Interventions Moist Heat;Therapeutic exercise;Therapeutic activities;Manual techniques;Balance training;Stair training;Gait training;Neuromuscular re-education   PT Next Visit Plan Manual therapy to increase flexion, extension, LE strengthening and balance activities.  PT Home Exercise Plan See patient instructions   Consulted and Agree with Plan of Care Patient        Problem List Patient Active Problem List   Diagnosis Date Noted  . OA (osteoarthritis) of knee 05/31/2014  . Hyperlipemia 06/08/2013  . Ischemic cardiomyopathy 05/27/2013  . Hypotension 05/27/2013  . VT (ventricular tachycardia) ([place]) 05/25/2013  . Hypokalemia 05/25/2013  . Hypothyroid   . STEMI (ST elevation myocardial infarction) ([place]) 05/24/2013  . Preoperative clearance 02/27/2012  . Takotsubo cardiomyopathy 05/15/2011  . Coronary artery disease 10/06/2010  . ASTHMA, EXTRINSIC 07/15/2008  . BREAST CANCER 06/07/2008  . HYPERLIPIDEMIA 06/07/2008  . HYPERTENSION 06/07/2008  . MYOCARDIAL INFARCTION 06/07/2008  . ALLERGIC RHINITIS 06/07/2008  . G E R D 06/07/2008  . DYSPNEA 06/07/2008    Fisher,Benjamin PT 01/21/2015, 10:46 AM  [place] [facility] [address], [place], [address] Phone: [phone]   Fax:  [phone]  Name: [name] MRN: [id number] Date of Birth: 24-Feb-1940

## 2015-01-25 ENCOUNTER — Ambulatory Visit: Payer: Medicare Other | Admitting: Physical Therapy

## 2015-01-25 DIAGNOSIS — Z96651 Presence of right artificial knee joint: Secondary | ICD-10-CM

## 2015-01-25 DIAGNOSIS — R262 Difficulty in walking, not elsewhere classified: Secondary | ICD-10-CM | POA: Diagnosis not present

## 2015-01-25 NOTE — Therapy (Signed)
[place] [facility] [address], [place], [address] Phone: [phone]   Fax:  [phone]  Physical Therapy Treatment  Patient Details  Name: [name] MRN: [id number] Date of Birth: [birth year] No Data Recorded  Encounter Date: 01/25/2015      PT End of Session - 01/25/15 1632    Visit Number 6   Number of Visits 13   Date for PT Re-Evaluation 02/17/15   Authorization Type G Code 6   Authorization Time Period 10   Authorization - Visit Number 6   PT Start Time 1115   PT Stop Time 1202   PT Time Calculation (min) 47 min   Equipment Utilized During Treatment Gait belt   Activity Tolerance Patient tolerated treatment well;No increased pain   Behavior During Therapy [facility] for tasks assessed/performed      Past Medical History  Diagnosis Date  . Allergic rhinitis   . Breast cancer ([place]) 1999, 2008    s/p bilat mastectomy  . Pulmonary nodules     no change CT chest 3/10  . GERD (gastroesophageal reflux disease)   . HTN (hypertension)   . Hyperlipidemia   . NSVT (nonsustained ventricular tachycardia) ([place])     a. 05/2013 after MI->amio caused nausea->BB only.  . Hypothyroid   . Ischemic cardiomyopathy     a. 05/2013 Echo: EF 40-45%, midinferior AK.  . Takotsubo cardiomyopathy     a. 2013 with subsequent recovery of LV fxn (followed by MI in 05/2013 with LV dysfxn).  Marland Kitchen CAD (coronary artery disease)     a. H/o nonobs dzs and vasospasm with takotsubo CM;  b. 05/2013 Inf STEMI/Cath: LM nl, LAD nl, LCX nondom 100d (failed PCI), RCA dominant, nl, RPDA 54m (52mm vessel-->Med Rx), EF 35-40%.  . Coronary vasospasm ([place])     some chest pain "last chest pressure episode 12'15 -evaluated at [facility], multiple testing-ruleout MI, "thought anxiety"  . DVT of popliteal vein ([facility])     left knee- caused bilateral Pulmonary emboli" Coumadin"  . Depression   . Anxiety   . Pulmonary emboli ([place])     history of dx. 07-05-13 [facility]   . Myocardial infarction ([place]) 05/2013  . Arthritis   . Dysrhythmia     NSVT    Past Surgical History  Procedure Laterality Date  . Bilateral mastectomy  1999, 2008  . Nasal sinus surgery  1972  . Thyroidectomy  1965  . Tonsillectomy  1950s  . Cardiac catheterization  04/2011    [facility] in [place]  . Left heart catheterization with coronary angiogram Bilateral 05/24/2013    Procedure: LEFT HEART CATHETERIZATION WITH CORONARY ANGIOGRAM;  Surgeon: Lorretta Harp, MD;  Location: [facility] CATH LAB;  Service: Cardiovascular;  Laterality: Bilateral;  . Tonsillectomy    . Breast surgery      bilateral mastectomy  . Knee arthroscopy Right   . Partial knee arthroplasty Left 05/31/2014    Procedure: UNICOMPARTMENTAL LEFT KNEE;  Surgeon: Gaynelle Arabian, MD;  [facility];  Service: Orthopedics;  Laterality: Left;  . Partial knee arthroplasty Right 12/13/2014    Procedure: RIGHT KNEE UNICOMPARTMENTAL ARTHROPLASTY;  Surgeon: Gaynelle Arabian, MD;  [facility];  Service: Orthopedics;  Laterality: Right;    There were no vitals filed for this visit.  Visit Diagnosis:  Difficulty walking  Status post total right knee replacement      Subjective Assessment - 01/25/15 1118    Subjective Patient reports she is  having intermittent pain in her R knee, along the medial area. No falls, she reports she has been feeling pretty good at times with ambulation.     Limitations Sitting;Walking;Standing;House hold activities   Patient Stated Goals To increase her ambulation tolerance and decrease pain.    Currently in Pain? Yes   Pain Score 2    Pain Location Knee   Pain Orientation Right   Pain Descriptors / Indicators Aching   Pain Type Surgical pain   Pain Onset More than a month ago   Pain Frequency Intermittent      Leg press 1 set at 35# x 10 repetitions, x 45# for 10 repetitions for 2 sets   A-P mobilizations in sitting grade III-IV for distal tibia to increase knee flexion ROM. No  increase in symptoms reported.   Scar tissue mobilization to proximal scar (notable thickening of scar)   Side stepping with red band x 10 bilaterally (not challenging) progressed to blue t-band x 10 bilaterally (more challenging)   Standing hip abductions with red t-band x 10 for two sets bilaterally with cuing for decreased ER of her thigh (indicating TFL substitution)   Side step ups with 3# dumbbell x 10 repetitions for 2 sets (challenging for her)   Heel raises with HHA, progressed to no HHA, progressed to steps, progressed to eccentric on RLE x 10 of each (very mild pain increase in gastroc region (medially) though no pain with eccentric.                            PT Education - 01/25/15 1632    Education provided Yes   Education Details Pt educated she is now primarily in strengthening phase of rehab and progressed HEP.    Person(s) Educated Patient   Methods Explanation;Demonstration;Handout   Comprehension Verbalized understanding;Returned demonstration             PT Long Term Goals - 12/31/14 0903    PT LONG TERM GOAL #1   Title Patient will report an LEFS score greater than 40 to demonstrate improved tolerance for ADLs and functional activity.   Baseline 29   Time 6   Period Weeks   Status New   PT LONG TERM GOAL #2   Title Patient will demonstrate a BERG balance score of greater than 48/56 to demonstrate increased safety with mobility.   Baseline 41   Time 6   Period Weeks   Status New   PT LONG TERM GOAL #3   Title Patient will demonstrate a DGI score of greater than 24/30 to demonstrate decreased risk of falling with ambulation.    Baseline 19   Time 6   Period Weeks   Status New   PT LONG TERM GOAL #4   Title Patient will demonstrate a TUG score of less than 13 seconds with no AD to demonstrate improved ambulatory balance and safety.    Baseline 12.5 seconds with SPC   Time 6   Period Weeks   Status New                Plan - 01/25/15 1633    Clinical Impression Statement Patient demonstrates excellent AROM of RLE as well as improved gait mechanics. Patient is able to ambulate short distances and ascend/descend steps without AD safely. No antalgic gait and slightly decreased knee flexion to initiate swing noted on RLE. Patient tolerated strengthening program well today, she reports very minor increase  in pain with ambulation on this session.    Pt will benefit from skilled therapeutic intervention in order to improve on the following deficits Abnormal gait;Decreased activity tolerance;Decreased balance;Decreased strength;Decreased mobility;Decreased knowledge of use of DME;Decreased endurance;Difficulty walking;Impaired flexibility   Rehab Potential Good   Clinical Impairments Affecting Rehab Potential Very motivated    PT Frequency 2x / week   PT Duration 6 weeks   PT Treatment/Interventions Moist Heat;Therapeutic exercise;Therapeutic activities;Manual techniques;Balance training;Stair training;Gait training;Neuromuscular re-education   PT Next Visit Plan Progress dynamic balance and LE strengthening.    PT Home Exercise Plan Red theraband for hip abductions as well as blue theraband for side stepping.    Consulted and Agree with Plan of Care Patient        Problem List Patient Active Problem List   Diagnosis Date Noted  . OA (osteoarthritis) of knee 05/31/2014  . Hyperlipemia 06/08/2013  . Ischemic cardiomyopathy 05/27/2013  . Hypotension 05/27/2013  . VT (ventricular tachycardia) ([place]) 05/25/2013  . Hypokalemia 05/25/2013  . Hypothyroid   . STEMI (ST elevation myocardial infarction) ([place]) 05/24/2013  . Preoperative clearance 02/27/2012  . Takotsubo cardiomyopathy 05/15/2011  . Coronary artery disease 10/06/2010  . ASTHMA, EXTRINSIC 07/15/2008  . BREAST CANCER 06/07/2008  . HYPERLIPIDEMIA 06/07/2008  . HYPERTENSION 06/07/2008  . MYOCARDIAL INFARCTION 06/07/2008  . ALLERGIC RHINITIS 06/07/2008   . G E R D 06/07/2008  . DYSPNEA 06/07/2008   Kerman Passey, PT, DPT    01/25/2015, 4:36 PM  [facility] [address] [place], [address] Phone: [phone]   Fax:  [phone]  Name: [name] MRN: [id number] Date of Birth: Jan 22, 1941

## 2015-01-28 ENCOUNTER — Ambulatory Visit: Payer: Medicare Other | Attending: Orthopedic Surgery | Admitting: Physical Therapy

## 2015-01-28 DIAGNOSIS — R262 Difficulty in walking, not elsewhere classified: Secondary | ICD-10-CM | POA: Insufficient documentation

## 2015-01-28 DIAGNOSIS — Z96651 Presence of right artificial knee joint: Secondary | ICD-10-CM | POA: Insufficient documentation

## 2015-01-28 NOTE — Therapy (Signed)
[place] [facility] [address], [place], [address] Phone: [phone]   Fax:  [phone]  Physical Therapy Treatment  Patient Details  Name: [name] MRN: [id number] Date of Birth: 15-Feb-1941 No Data Recorded  Encounter Date: 01/28/2015      PT End of Session - 01/28/15 1146    Visit Number 7   Number of Visits 13   Date for PT Re-Evaluation 02/17/15   Authorization Type G Code 6   Authorization Time Period 10   Authorization - Visit Number 7   PT Start Time 1100   PT Stop Time [id number]   PT Time Calculation (min) 45 min   Activity Tolerance Patient tolerated treatment well;No increased pain;Patient limited by pain   Behavior During Therapy [facility] for tasks assessed/performed      Past Medical History  Diagnosis Date  . Allergic rhinitis   . Breast cancer ([place]) 1999, 2008    s/p bilat mastectomy  . Pulmonary nodules     no change CT chest 3/10  . GERD (gastroesophageal reflux disease)   . HTN (hypertension)   . Hyperlipidemia   . NSVT (nonsustained ventricular tachycardia) ([place])     a. 05/2013 after MI->amio caused nausea->BB only.  . Hypothyroid   . Ischemic cardiomyopathy     a. 05/2013 Echo: EF 40-45%, midinferior AK.  . Takotsubo cardiomyopathy     a. 2013 with subsequent recovery of LV fxn (followed by MI in 05/2013 with LV dysfxn).  Marland Kitchen CAD (coronary artery disease)     a. H/o nonobs dzs and vasospasm with takotsubo CM;  b. 05/2013 Inf STEMI/Cath: LM nl, LAD nl, LCX nondom 100d (failed PCI), RCA dominant, nl, RPDA 35m (28mm vessel-->Med Rx), EF 35-40%.  . Coronary vasospasm ([place])     some chest pain "last chest pressure episode 12'15 -evaluated at [facility], multiple testing-ruleout MI, "thought anxiety"  . DVT of popliteal vein ([facility])     left knee- caused bilateral Pulmonary emboli" Coumadin"  . Depression   . Anxiety   . Pulmonary emboli ([place])     history of dx. 07-05-13 [facility]  . Myocardial infarction  ([place]) 05/2013  . Arthritis   . Dysrhythmia     NSVT    Past Surgical History  Procedure Laterality Date  . Bilateral mastectomy  1999, 2008  . Nasal sinus surgery  1972  . Thyroidectomy  1965  . Tonsillectomy  1950s  . Cardiac catheterization  04/2011    [facility] in [place]  . Left heart catheterization with coronary angiogram Bilateral 05/24/2013    Procedure: LEFT HEART CATHETERIZATION WITH CORONARY ANGIOGRAM;  Surgeon: Lorretta Harp, MD;  Location: [facility] CATH LAB;  Service: Cardiovascular;  Laterality: Bilateral;  . Tonsillectomy    . Breast surgery      bilateral mastectomy  . Knee arthroscopy Right   . Partial knee arthroplasty Left 05/31/2014    Procedure: UNICOMPARTMENTAL LEFT KNEE;  Surgeon: Gaynelle Arabian, MD;  [facility];  Service: Orthopedics;  Laterality: Left;  . Partial knee arthroplasty Right 12/13/2014    Procedure: RIGHT KNEE UNICOMPARTMENTAL ARTHROPLASTY;  Surgeon: Gaynelle Arabian, MD;  Location[facility];  Service: Orthopedics;  Laterality: Right;    There were no vitals filed for this visit.  Visit Diagnosis:  Difficulty walking  Status post total right knee replacement      Subjective Assessment - 01/28/15 1144    Subjective Patient reports she has been so sore this week  after last session that she has not been able to do any of her home exercises.    Limitations Sitting;Walking;Standing;House hold activities   Patient Stated Goals To increase her ambulation tolerance and decrease pain.    Currently in Pain? Yes   Pain Location Knee   Pain Orientation Right   Pain Descriptors / Indicators Sore   Pain Type Surgical pain   Pain Onset More than a month ago   Pain Frequency Intermittent   Multiple Pain Sites No         Objective: Focus of session on strengthening and scar massage.  Half squats with cuing for WB through RLE, x10  Heel raises x20   Step ups on 8" step forward and lateral x 10 reps on right -cues to push through legs and use  UEs as needed for assistance and balance.   Standing hip abduction x 20 and knee flexion for hamstring strengthening x 20 bilaterally.   Sidestepping on blue foam x 6 laps with UE support as needed.   Static standing on blue foam with narrow stance ( easy), then SLS on blue foam x 30 sec on right. UE assist as needed for balance.    Seated laq x 20 on right with cues for correct technique.   Scar massage, patella mobilization   Sustained extension stretch (PROM stretch) .          PT Education - 01/28/15 1145    Education provided Yes   Education Details patient cued on proper exercise technique   Person(s) Educated Patient   Methods Explanation;Demonstration   Comprehension Verbalized understanding;Returned demonstration             PT Long Term Goals - 12/31/14 0903    PT LONG TERM GOAL #1   Title Patient will report an LEFS score greater than 40 to demonstrate improved tolerance for ADLs and functional activity.   Baseline 29   Time 6   Period Weeks   Status New   PT LONG TERM GOAL #2   Title Patient will demonstrate a BERG balance score of greater than 48/56 to demonstrate increased safety with mobility.   Baseline 41   Time 6   Period Weeks   Status New   PT LONG TERM GOAL #3   Title Patient will demonstrate a DGI score of greater than 24/30 to demonstrate decreased risk of falling with ambulation.    Baseline 19   Time 6   Period Weeks   Status New   PT LONG TERM GOAL #4   Title Patient will demonstrate a TUG score of less than 13 seconds with no AD to demonstrate improved ambulatory balance and safety.    Baseline 12.5 seconds with SPC   Time 6   Period Weeks   Status New               Plan - 01/28/15 1146    Clinical Impression Statement Patient demonstrates increased pain this session, therefore intensity of exercises reduced. Patient using spc for comfort this session. Will plan to start progressing strengthening again next session.     Pt will benefit from skilled therapeutic intervention in order to improve on the following deficits Abnormal gait;Decreased activity tolerance;Decreased balance;Decreased strength;Decreased mobility;Decreased knowledge of use of DME;Decreased endurance;Difficulty walking;Impaired flexibility   Rehab Potential Good   PT Frequency 2x / week   PT Duration 6 weeks   PT Treatment/Interventions Moist Heat;Therapeutic exercise;Therapeutic activities;Manual techniques;Balance training;Stair training;Gait training;Neuromuscular re-education   PT  Next Visit Plan Progress dynamic balance and LE strengthening.    Consulted and Agree with Plan of Care Patient        Problem List Patient Active Problem List   Diagnosis Date Noted  . OA (osteoarthritis) of knee 05/31/2014  . Hyperlipemia 06/08/2013  . Ischemic cardiomyopathy 05/27/2013  . Hypotension 05/27/2013  . VT (ventricular tachycardia) ([place]) 05/25/2013  . Hypokalemia 05/25/2013  . Hypothyroid   . STEMI (ST elevation myocardial infarction) ([place]) 05/24/2013  . Preoperative clearance 02/27/2012  . Takotsubo cardiomyopathy 05/15/2011  . Coronary artery disease 10/06/2010  . ASTHMA, EXTRINSIC 07/15/2008  . BREAST CANCER 06/07/2008  . HYPERLIPIDEMIA 06/07/2008  . HYPERTENSION 06/07/2008  . MYOCARDIAL INFARCTION 06/07/2008  . ALLERGIC RHINITIS 06/07/2008  . G E R D 06/07/2008  . DYSPNEA 06/07/2008    Eulia Hatcher, PT, MPT, GCS 01/28/2015, 11:49 AM  [place] [facility] [address] [place], [address] Phone: [phone]   Fax:  [phone]  Name: ABIHA IM MRN: [id number] Date of Birth: [birth year]

## 2015-01-31 ENCOUNTER — Ambulatory Visit: Payer: Medicare Other | Admitting: Physical Therapy

## 2015-01-31 DIAGNOSIS — R262 Difficulty in walking, not elsewhere classified: Secondary | ICD-10-CM

## 2015-01-31 NOTE — Therapy (Signed)
[facility] [address], [place], [address] Phone: [phone]   Fax:  [phone]  Physical Therapy Treatment  Patient Details  Name: [name] MRN: [id number] Date of Birth: 04/07/1940 No Data Recorded  Encounter Date: 01/31/2015      PT End of Session - 01/31/15 1134    Visit Number 8   Number of Visits 13   Date for PT Re-Evaluation 02/17/15   Authorization Type G Code 6   Authorization Time Period 10   Authorization - Visit Number 8   PT Start Time 1100   PT Stop Time 1141   PT Time Calculation (min) 41 min   Activity Tolerance Patient tolerated treatment well;No increased pain;Patient limited by pain   Behavior During Therapy [facility] for tasks assessed/performed      Past Medical History  Diagnosis Date  . Allergic rhinitis   . Breast cancer ([place]) 1999, 2008    s/p bilat mastectomy  . Pulmonary nodules     no change CT chest 3/10  . GERD (gastroesophageal reflux disease)   . HTN (hypertension)   . Hyperlipidemia   . NSVT (nonsustained ventricular tachycardia) ([place])     a. 05/2013 after MI->amio caused nausea->BB only.  . Hypothyroid   . Ischemic cardiomyopathy     a. 05/2013 Echo: EF 40-45%, midinferior AK.  . Takotsubo cardiomyopathy     a. 2013 with subsequent recovery of LV fxn (followed by MI in 05/2013 with LV dysfxn).  Marland Kitchen CAD (coronary artery disease)     a. H/o nonobs dzs and vasospasm with takotsubo CM;  b. 05/2013 Inf STEMI/Cath: LM nl, LAD nl, LCX nondom 100d (failed PCI), RCA dominant, nl, RPDA 3m (59mm vessel-->Med Rx), EF 35-40%.  . Coronary vasospasm ([place])     some chest pain "last chest pressure episode 12'15 -evaluated at [facility], multiple testing-ruleout MI, "thought anxiety"  . DVT of popliteal vein ([facility])     left knee- caused bilateral Pulmonary emboli" Coumadin"  . Depression   . Anxiety   . Pulmonary emboli ([place])     history of dx. 07-05-13 [facility]  . Myocardial infarction  ([place]) 05/2013  . Arthritis   . Dysrhythmia     NSVT    Past Surgical History  Procedure Laterality Date  . Bilateral mastectomy  1999, 2008  . Nasal sinus surgery  1972  . Thyroidectomy  1965  . Tonsillectomy  1950s  . Cardiac catheterization  04/2011    [facility] in [place]  . Left heart catheterization with coronary angiogram Bilateral 05/24/2013    Procedure: LEFT HEART CATHETERIZATION WITH CORONARY ANGIOGRAM;  Surgeon: Lorretta Harp, MD;  Location: [facility] CATH LAB;  Service: Cardiovascular;  Laterality: Bilateral;  . Tonsillectomy    . Breast surgery      bilateral mastectomy  . Knee arthroscopy Right   . Partial knee arthroplasty Left 05/31/2014    Procedure: UNICOMPARTMENTAL LEFT KNEE;  Surgeon: Gaynelle Arabian, MD;  Location: [facility]S;  Service: Orthopedics;  Laterality: Left;  . Partial knee arthroplasty Right 12/13/2014    Procedure: RIGHT KNEE UNICOMPARTMENTAL ARTHROPLASTY;  Surgeon: Gaynelle Arabian, MD;  Location: [facility];  Service: Orthopedics;  Laterality: Right;    There were no vitals filed for this visit.  Visit Diagnosis:  Difficulty walking      Subjective Assessment - 01/31/15 1103    Subjective Pt is continuing to have significant pain with standing.    Limitations Sitting;Walking;Standing;House hold activities  Patient Stated Goals To increase her ambulation tolerance and decrease pain.    Currently in Pain? No/denies   Pain Score 0-No pain   Pain Onset More than a month ago            Objective:   Stair stretch 3x1 min  Step ups 3x10 with cuing for uprigth posture/avoiding hip hike. Hip extension toe taps 3x15, abduction toe taps 3x15 - cuing to maintain neutral spine/avoid compensation. High knee gait 10x20 steps Diagonal gait 10x30 steps Nu-step x8 min with cuing for knee flexion, including manual assistance on arms to achieve stretch.  amb in clinic following the rest of session to assess gait with fatigue without SPC. Pt is mod I with extra  time needed for good performance.           PT Education - 01/31/15 1116    Education provided Yes   Education Details cued on exercise technique and pain control   Person(s) Educated Patient   Methods Explanation   Comprehension Verbalized understanding             PT Long Term Goals - 12/31/14 0903    PT LONG TERM GOAL #1   Title Patient will report an LEFS score greater than 40 to demonstrate improved tolerance for ADLs and functional activity.   Baseline 29   Time 6   Period Weeks   Status New   PT LONG TERM GOAL #2   Title Patient will demonstrate a BERG balance score of greater than 48/56 to demonstrate increased safety with mobility.   Baseline 41   Time 6   Period Weeks   Status New   PT LONG TERM GOAL #3   Title Patient will demonstrate a DGI score of greater than 24/30 to demonstrate decreased risk of falling with ambulation.    Baseline 19   Time 6   Period Weeks   Status New   PT LONG TERM GOAL #4   Title Patient will demonstrate a TUG score of less than 13 seconds with no AD to demonstrate improved ambulatory balance and safety.    Baseline 12.5 seconds with SPC   Time 6   Period Weeks   Status New               Plan - 01/31/15 1134    Clinical Impression Statement Pt performed all exercises well with minimal to no c/o pain. requires cuing to perform well to avoid compensatory hip hike. PT encouraged pt to discontinue use of SPC except when out in public.   Pt will benefit from skilled therapeutic intervention in order to improve on the following deficits Abnormal gait;Decreased activity tolerance;Decreased balance;Decreased strength;Decreased mobility;Decreased knowledge of use of DME;Decreased endurance;Difficulty walking;Impaired flexibility   Rehab Potential Good   PT Frequency 2x / week   PT Duration 6 weeks   PT Treatment/Interventions Moist Heat;Therapeutic exercise;Therapeutic activities;Manual techniques;Balance training;Stair  training;Gait training;Neuromuscular re-education   PT Next Visit Plan Progress dynamic balance and LE strengthening.    Consulted and Agree with Plan of Care Patient        Problem List Patient Active Problem List   Diagnosis Date Noted  . OA (osteoarthritis) of knee 05/31/2014  . Hyperlipemia 06/08/2013  . Ischemic cardiomyopathy 05/27/2013  . Hypotension 05/27/2013  . VT (ventricular tachycardia) ([place]) 05/25/2013  . Hypokalemia 05/25/2013  . Hypothyroid   . STEMI (ST elevation myocardial infarction) ([place]) 05/24/2013  . Preoperative clearance 02/27/2012  . Takotsubo cardiomyopathy 05/15/2011  . Coronary  artery disease 10/06/2010  . ASTHMA, EXTRINSIC 07/15/2008  . BREAST CANCER 06/07/2008  . HYPERLIPIDEMIA 06/07/2008  . HYPERTENSION 06/07/2008  . MYOCARDIAL INFARCTION 06/07/2008  . ALLERGIC RHINITIS 06/07/2008  . G E R D 06/07/2008  . DYSPNEA 06/07/2008    [name] PT DPT 01/31/2015, 11:50 AM  [place] [facility] [address] [place], [address] Phone: [phone]   Fax:  [phone]  Name: [name] MRN: [id number] Date of Birth: 1940-07-22

## 2015-02-04 ENCOUNTER — Ambulatory Visit: Payer: Medicare Other | Admitting: Physical Therapy

## 2015-02-07 ENCOUNTER — Ambulatory Visit: Payer: Medicare Other | Admitting: Physical Therapy

## 2015-02-07 DIAGNOSIS — R262 Difficulty in walking, not elsewhere classified: Secondary | ICD-10-CM | POA: Diagnosis not present

## 2015-02-07 NOTE — Therapy (Signed)
[facility] [address], [place], [address] Phone: [phone]   Fax:  [phone]  Physical Therapy Treatment  Patient Details  Name: [name] MRN: [id number] Date of Birth: 1940-03-30 No Data Recorded  Encounter Date: 02/07/2015      PT End of Session - 02/07/15 1234    Visit Number 9   Number of Visits 13   Date for PT Re-Evaluation 02/17/15   Authorization Type G Code 6   Authorization Time Period 10   Authorization - Visit Number 9   PT Start Time [id number]   PT Stop Time 1225   PT Time Calculation (min) 40 min   Activity Tolerance Patient tolerated treatment well;No increased pain;Patient limited by pain   Behavior During Therapy [facility] for tasks assessed/performed      Past Medical History  Diagnosis Date  . Allergic rhinitis   . Breast cancer ([place]) 1999, 2008    s/p bilat mastectomy  . Pulmonary nodules     no change CT chest 3/10  . GERD (gastroesophageal reflux disease)   . HTN (hypertension)   . Hyperlipidemia   . NSVT (nonsustained ventricular tachycardia) ([place])     a. 05/2013 after MI->amio caused nausea->BB only.  . Hypothyroid   . Ischemic cardiomyopathy     a. 05/2013 Echo: EF 40-45%, midinferior AK.  . Takotsubo cardiomyopathy     a. 2013 with subsequent recovery of LV fxn (followed by MI in 05/2013 with LV dysfxn).  Marland Kitchen CAD (coronary artery disease)     a. H/o nonobs dzs and vasospasm with takotsubo CM;  b. 05/2013 Inf STEMI/Cath: LM nl, LAD nl, LCX nondom 100d (failed PCI), RCA dominant, nl, RPDA 68m (62mm vessel-->Med Rx), EF 35-40%.  . Coronary vasospasm ([place])     some chest pain "last chest pressure episode 12'15 -evaluated at [facility], multiple testing-ruleout MI, "thought anxiety"  . DVT of popliteal vein ([facility])     left knee- caused bilateral Pulmonary emboli" Coumadin"  . Depression   . Anxiety   . Pulmonary emboli ([place])     history of dx. 07-05-13 [facility]  . Myocardial infarction  ([place]) 05/2013  . Arthritis   . Dysrhythmia     NSVT    Past Surgical History  Procedure Laterality Date  . Bilateral mastectomy  1999, 2008  . Nasal sinus surgery  1972  . Thyroidectomy  1965  . Tonsillectomy  1950s  . Cardiac catheterization  04/2011    [facility] in [place]  . Left heart catheterization with coronary angiogram Bilateral 05/24/2013    Procedure: LEFT HEART CATHETERIZATION WITH CORONARY ANGIOGRAM;  Surgeon: Lorretta Harp, MD;  Location: [facility] CATH LAB;  Service: Cardiovascular;  Laterality: Bilateral;  . Tonsillectomy    . Breast surgery      bilateral mastectomy  . Knee arthroscopy Right   . Partial knee arthroplasty Left 05/31/2014    Procedure: UNICOMPARTMENTAL LEFT KNEE;  Surgeon: Gaynelle Arabian, MD;  [facility]: [facility];  Service: Orthopedics;  Laterality: Left;  . Partial knee arthroplasty Right 12/13/2014    Procedure: RIGHT KNEE UNICOMPARTMENTAL ARTHROPLASTY;  Surgeon: Gaynelle Arabian, MD;  Location: [facility];  Service: Orthopedics;  Laterality: Right;    There were no vitals filed for this visit.  Visit Diagnosis:  Difficulty walking      Subjective Assessment - 02/07/15 1233    Subjective Pt reports she is noticing significant fatigue with activity.   Limitations Sitting;Walking;Standing;House hold activities  Patient Stated Goals To increase her ambulation tolerance and decrease pain.    Currently in Pain? No/denies   Pain Score 0-No pain   Pain Onset More than a month ago           Objective: 6MWT 550' with incr. Lateropulsion.  Toe taps on 16" step  3x20 with cuing to avoid abduction/IR to address impaired knee flexion.  amb 10x10' with cuing for "butt kicks" to improve HS activation.  amb 10x10' with cuing for heel to toe with neutral hip posture.  amb 5x10 yards with cuing to walk as quickly as possible which pt able to peform with improved heel to toe following cuing.  deadlift pattern - attempted with 10# but produced mild back pain.  Cued to TA contract but no change.  Same performed with no weight, 3x10. (tapping hands on chair).  Nu-step - initially produced HS pain, with modification of seat position and cuing to rotate feet to activate medial HS able to perform pain free. L1 8 min.                      PT Education - 02/07/15 1234    Education provided Yes   Education Details cued to incr. gait at home. added deadlift (unweighted)   Person(s) Educated Patient   Methods Explanation   Comprehension Verbalized understanding             PT Long Term Goals - 12/31/14 0903    PT LONG TERM GOAL #1   Title Patient will report an LEFS score greater than 40 to demonstrate improved tolerance for ADLs and functional activity.   Baseline 29   Time 6   Period Weeks   Status New   PT LONG TERM GOAL #2   Title Patient will demonstrate a BERG balance score of greater than 48/56 to demonstrate increased safety with mobility.   Baseline 41   Time 6   Period Weeks   Status New   PT LONG TERM GOAL #3   Title Patient will demonstrate a DGI score of greater than 24/30 to demonstrate decreased risk of falling with ambulation.    Baseline 19   Time 6   Period Weeks   Status New   PT LONG TERM GOAL #4   Title Patient will demonstrate a TUG score of less than 13 seconds with no AD to demonstrate improved ambulatory balance and safety.    Baseline 12.5 seconds with SPC   Time 6   Period Weeks   Status New               Plan - 02/07/15 1235    Clinical Impression Statement Pt demonstrates incr. lateropulsion with gait and demonstrates slow gait related to this. required rest break following 6MWT. Focus of session on adressing limited HS strength with lengthened position as seen by impaired gait.   Pt will benefit from skilled therapeutic intervention in order to improve on the following deficits Abnormal gait;Decreased activity tolerance;Decreased balance;Decreased strength;Decreased  mobility;Decreased knowledge of use of DME;Decreased endurance;Difficulty walking;Impaired flexibility   Rehab Potential Good   PT Frequency 2x / week   PT Duration 6 weeks   PT Treatment/Interventions Moist Heat;Therapeutic exercise;Therapeutic activities;Manual techniques;Balance training;Stair training;Gait training;Neuromuscular re-education   PT Next Visit Plan Progress dynamic balance and LE strengthening.    Consulted and Agree with Plan of Care Patient        Problem List Patient Active Problem List   Diagnosis Date  Noted  . OA (osteoarthritis) of knee 05/31/2014  . Hyperlipemia 06/08/2013  . Ischemic cardiomyopathy 05/27/2013  . Hypotension 05/27/2013  . VT (ventricular tachycardia) ([place]) 05/25/2013  . Hypokalemia 05/25/2013  . Hypothyroid   . STEMI (ST elevation myocardial infarction) ([place]) 05/24/2013  . Preoperative clearance 02/27/2012  . Takotsubo cardiomyopathy 05/15/2011  . Coronary artery disease 10/06/2010  . ASTHMA, EXTRINSIC 07/15/2008  . BREAST CANCER 06/07/2008  . HYPERLIPIDEMIA 06/07/2008  . HYPERTENSION 06/07/2008  . MYOCARDIAL INFARCTION 06/07/2008  . ALLERGIC RHINITIS 06/07/2008  . G E R D 06/07/2008  . DYSPNEA 06/07/2008    Aaron Boeh PT DPT 02/07/2015, 12:37 PM  [place] [facility] [address] [place], [address] Phone: [phone]   Fax:  [phone]  Name: [name] MRN: [id number] Date of Birth: [birth year]

## 2015-02-10 ENCOUNTER — Encounter: Payer: Medicare Other | Admitting: Physical Therapy

## 2015-02-17 ENCOUNTER — Ambulatory Visit: Payer: Medicare Other | Admitting: Physical Therapy

## 2015-02-17 DIAGNOSIS — R262 Difficulty in walking, not elsewhere classified: Secondary | ICD-10-CM

## 2015-02-17 NOTE — Therapy (Signed)
[facility] [facility] [address] [place], [address] Phone: [phone]   Fax:  [phone]  Physical Therapy Treatment  Patient Details  Name: [name] MRN: [id number] Date of Birth: 12/05/1940 No Data Recorded  Encounter Date: 02/17/2015      PT End of Session - 02/17/15 1114    Visit Number 10   Number of Visits 13   Date for PT Re-Evaluation 03/21/15   Authorization Type G Code 6   Authorization Time Period 10   Authorization - Visit Number 10   PT Start Time 1107   PT Stop Time 1147   PT Time Calculation (min) 40 min   Activity Tolerance Patient tolerated treatment well;No increased pain;Patient limited by pain   Behavior During Therapy [facility] for tasks assessed/performed      Past Medical History  Diagnosis Date  . Allergic rhinitis   . Breast cancer ([place]) 1999, 2008    s/p bilat mastectomy  . Pulmonary nodules     no change CT chest 3/10  . GERD (gastroesophageal reflux disease)   . HTN (hypertension)   . Hyperlipidemia   . NSVT (nonsustained ventricular tachycardia) ([place])     a. 05/2013 after MI->amio caused nausea->BB only.  . Hypothyroid   . Ischemic cardiomyopathy     a. 05/2013 Echo: EF 40-45%, midinferior AK.  . Takotsubo cardiomyopathy     a. 2013 with subsequent recovery of LV fxn (followed by MI in 05/2013 with LV dysfxn).  Marland Kitchen CAD (coronary artery disease)     a. H/o nonobs dzs and vasospasm with takotsubo CM;  b. 05/2013 Inf STEMI/Cath: LM nl, LAD nl, LCX nondom 100d (failed PCI), RCA dominant, nl, RPDA 53m (81mm vessel-->Med Rx), EF 35-40%.  . Coronary vasospasm ([place])     some chest pain "last chest pressure episode 12'15 -evaluated at [facility], multiple testing-ruleout MI, "thought anxiety"  . DVT of popliteal vein ([facility])     left knee- caused bilateral Pulmonary emboli" Coumadin"  . Depression   . Anxiety   . Pulmonary emboli ([place])     history of dx. 07-05-13 [facility]  . Myocardial  infarction ([place]) 05/2013  . Arthritis   . Dysrhythmia     NSVT    Past Surgical History  Procedure Laterality Date  . Bilateral mastectomy  1999, 2008  . Nasal sinus surgery  1972  . Thyroidectomy  1965  . Tonsillectomy  1950s  . Cardiac catheterization  04/2011    [facility] in [place]  . Left heart catheterization with coronary angiogram Bilateral 05/24/2013    Procedure: LEFT HEART CATHETERIZATION WITH CORONARY ANGIOGRAM;  Surgeon: Lorretta Harp, MD;  Location: [facility] CATH LAB;  Service: Cardiovascular;  Laterality: Bilateral;  . Tonsillectomy    . Breast surgery      bilateral mastectomy  . Knee arthroscopy Right   . Partial knee arthroplasty Left 05/31/2014    Procedure: UNICOMPARTMENTAL LEFT KNEE;  Surgeon: Gaynelle Arabian, MD;  Location: [facility];  Service: Orthopedics;  Laterality: Left;  . Partial knee arthroplasty Right 12/13/2014    Procedure: RIGHT KNEE UNICOMPARTMENTAL ARTHROPLASTY;  Surgeon: Gaynelle Arabian, MD;  Location: [facility];  Service: Orthopedics;  Laterality: Right;    There were no vitals filed for this visit.  Visit Diagnosis:  Difficulty walking - Plan: PT plan of care cert/re-cert      Subjective Assessment - 02/17/15 1113    Subjective Decr. pain with walking but pt continues to c/o  pain upon standing.   Limitations Sitting;Walking;Standing;House hold activities   Patient Stated Goals To increase her ambulation tolerance and decrease pain.    Currently in Pain? No/denies   Pain Score 0-No pain   Pain Onset More than a month ago           Objective: TUG, LEFS, BERG.  amb in clinic with cuing for upright posture.  Sit<> stand practice to assess and correct for use of UE to minimize pressure on R LE.  Knee swings 3x1 min on R. Same performed with manual distraction, slow passive knee flexion/extension to address pain.   Following this pt able to perform sit<>stand pain free.  Nu-step (no charge) L3 8  min.                           PT Long Term Goals - 26-Feb-2015 1219    PT LONG TERM GOAL #1   Title Patient will report an LEFS score greater than 40 to demonstrate improved tolerance for ADLs and functional activity.   Baseline 29   Time 6   Period Weeks   Status Achieved   PT LONG TERM GOAL #2   Title Patient will demonstrate a BERG balance score of greater than 48/56 to demonstrate increased safety with mobility.   Baseline 41   Time 6   Period Weeks   Status Achieved   PT LONG TERM GOAL #3   Title Patient will demonstrate a DGI score of greater than 24/30 to demonstrate decreased risk of falling with ambulation.    Baseline 19   Time 6   Period Weeks   Status Deferred   PT LONG TERM GOAL #4   Title Patient will demonstrate a TUG score of less than 13 seconds with no AD to demonstrate improved ambulatory balance and safety.    Baseline 12.5 seconds with SPC   Time 6   Period Weeks   Status Achieved               Plan - 02/26/2015 1115    Clinical Impression Statement Primary difficulty at this time is pain with standing. BERG 56/56, TUG 9 sec., LEFS 51/80. Strength and ROM are improving as is appropriate - still limited at end range for extension and flexion.   Pt will benefit from skilled therapeutic intervention in order to improve on the following deficits Abnormal gait;Decreased activity tolerance;Decreased balance;Decreased strength;Decreased mobility;Decreased knowledge of use of DME;Decreased endurance;Difficulty walking;Impaired flexibility   Rehab Potential Good   PT Frequency 2x / week   PT Duration 6 weeks   PT Treatment/Interventions Moist Heat;Therapeutic exercise;Therapeutic activities;Manual techniques;Balance training;Stair training;Gait training;Neuromuscular re-education   PT Next Visit Plan Progress dynamic balance and LE strengthening.    Consulted and Agree with Plan of Care Patient          G-Codes - 26-Feb-2015 1220     Functional Assessment Tool Used TUG, Berg Balance, 42m walk, clinical judgement    Functional Limitation Mobility: Walking and moving around   Mobility: Walking and Moving Around Current Status [phone]) At least 1 percent but less than 20 percent impaired, limited or restricted      Problem List Patient Active Problem List   Diagnosis Date Noted  . OA (osteoarthritis) of knee 05/31/2014  . Hyperlipemia 06/08/2013  . Ischemic cardiomyopathy 05/27/2013  . Hypotension 05/27/2013  . VT (ventricular tachycardia) ([place]) 05/25/2013  . Hypokalemia 05/25/2013  . Hypothyroid   . STEMI (ST  elevation myocardial infarction) ([place]) 05/24/2013  . Preoperative clearance 02/27/2012  . Takotsubo cardiomyopathy 05/15/2011  . Coronary artery disease 10/06/2010  . ASTHMA, EXTRINSIC 07/15/2008  . BREAST CANCER 06/07/2008  . HYPERLIPIDEMIA 06/07/2008  . HYPERTENSION 06/07/2008  . MYOCARDIAL INFARCTION 06/07/2008  . ALLERGIC RHINITIS 06/07/2008  . G E R D 06/07/2008  . DYSPNEA 06/07/2008    Fisher,Benjamin PT DPT 02/17/2015, 12:25 PM  [place] [facility] [address] [place], [address] Phone: [phone]   Fax:  [phone]  Name: [name] MRN: [id number] Date of Birth: 06-Jun-1940

## 2015-02-22 ENCOUNTER — Ambulatory Visit: Payer: Medicare Other | Attending: Orthopedic Surgery | Admitting: Physical Therapy

## 2015-02-22 DIAGNOSIS — Z96651 Presence of right artificial knee joint: Secondary | ICD-10-CM | POA: Insufficient documentation

## 2015-02-22 DIAGNOSIS — R262 Difficulty in walking, not elsewhere classified: Secondary | ICD-10-CM | POA: Diagnosis present

## 2015-02-22 NOTE — Therapy (Signed)
[place] [facility] [address] [place], [address] Phone: [phone]   Fax:  [phone]  Physical Therapy Treatment  Patient Details  Name: [name] MRN: [id number] Date of Birth: [birth year] No Data Recorded  Encounter Date: 02/22/2015      PT End of Session - 02/22/15 1114    Visit Number 11   Number of Visits 13   Date for PT Re-Evaluation 03/21/15   Authorization Type G Code 6   Authorization Time Period 10   Authorization - Visit Number 10   PT Start Time 1032   PT Stop Time 1115   PT Time Calculation (min) 43 min   Activity Tolerance Patient tolerated treatment well;Patient limited by pain   Behavior During Therapy [facility] for tasks assessed/performed      Past Medical History  Diagnosis Date  . Allergic rhinitis   . Breast cancer ([place]) 1999, 2008    s/p bilat mastectomy  . Pulmonary nodules     no change CT chest 3/10  . GERD (gastroesophageal reflux disease)   . HTN (hypertension)   . Hyperlipidemia   . NSVT (nonsustained ventricular tachycardia) ([place])     a. 05/2013 after MI->amio caused nausea->BB only.  . Hypothyroid   . Ischemic cardiomyopathy     a. 05/2013 Echo: EF 40-45%, midinferior AK.  . Takotsubo cardiomyopathy     a. 2013 with subsequent recovery of LV fxn (followed by MI in 05/2013 with LV dysfxn).  Marland Kitchen CAD (coronary artery disease)     a. H/o nonobs dzs and vasospasm with takotsubo CM;  b. 05/2013 Inf STEMI/Cath: LM nl, LAD nl, LCX nondom 100d (failed PCI), RCA dominant, nl, RPDA 54m (35mm vessel-->Med Rx), EF 35-40%.  . Coronary vasospasm ([place])     some chest pain "last chest pressure episode 12'15 -evaluated at [facility], multiple testing-ruleout MI, "thought anxiety"  . DVT of popliteal vein ([facility])     left knee- caused bilateral Pulmonary emboli" Coumadin"  . Depression   . Anxiety   . Pulmonary emboli ([place])     history of dx. 07-05-13 [facility]  . Myocardial infarction ([place]) 05/2013  .  Arthritis   . Dysrhythmia     NSVT    Past Surgical History  Procedure Laterality Date  . Bilateral mastectomy  1999, 2008  . Nasal sinus surgery  1972  . Thyroidectomy  1965  . Tonsillectomy  1950s  . Cardiac catheterization  04/2011    [facility] in [place]  . Left heart catheterization with coronary angiogram Bilateral 05/24/2013    Procedure: LEFT HEART CATHETERIZATION WITH CORONARY ANGIOGRAM;  Surgeon: Lorretta Harp, MD;  Location: [facility] CATH LAB;  Service: Cardiovascular;  Laterality: Bilateral;  . Tonsillectomy    . Breast surgery      bilateral mastectomy  . Knee arthroscopy Right   . Partial knee arthroplasty Left 05/31/2014    Procedure: UNICOMPARTMENTAL LEFT KNEE;  Surgeon: Gaynelle Arabian, MD;  Location: [facility] ORS;  Service: Orthopedics;  Laterality: Left;  . Partial knee arthroplasty Right 12/13/2014    Procedure: RIGHT KNEE UNICOMPARTMENTAL ARTHROPLASTY;  Surgeon: Gaynelle Arabian, MD;  Location: [facility] ORS;  Service: Orthopedics;  Laterality: Right;    There were no vitals filed for this visit.  Visit Diagnosis:  Difficulty walking      Subjective Assessment - 02/22/15 1041    Subjective Pain for first few min in morning and upon getting up and first few steps. Pt was able  to shop and be active with family over weekend w/o increased pain.    Limitations Sitting;Walking;Standing;House hold activities   Patient Stated Goals To increase her ambulation tolerance and decrease pain.    Currently in Pain? No/denies   Pain Score 0-No pain   Pain Onset More than a month ago           Objective:  Flexion/extension PROM w/ distraction performed in sitting 5X5 to decr. Pain (pt reports in the past this has helped significantly with tolerance for activity)  Supine knee ext mobs performed w/ LE distraction 4 bouts at 45 sec. To facilitate knee extension - improved ~ 5 degr. Pt had difficulty tolerating initially.  Grade IV tib/femur PAs performed with pt supine and knee bent,    Performed NuStep with max knee ext for 5 min (no charge)  Gait observation performed  TG B LE squat 3X10, and RLE 3X10  Deferred squats due to incr. Pain/report of fatigue following TG exercise and noted decr. Knee extension.                      PT Education - 02/22/15 1047    Education provided Yes   Education Details pt. instructed in full knee ext during exercises as well as exercise strengthing progression    Person(s) Educated Patient   Methods Explanation;Demonstration   Comprehension Verbalized understanding             PT Long Term Goals - 02/17/15 1219    PT LONG TERM GOAL #1   Title Patient will report an LEFS score greater than 40 to demonstrate improved tolerance for ADLs and functional activity.   Baseline 29   Time 6   Period Weeks   Status Achieved   PT LONG TERM GOAL #2   Title Patient will demonstrate a BERG balance score of greater than 48/56 to demonstrate increased safety with mobility.   Baseline 41   Time 6   Period Weeks   Status Achieved   PT LONG TERM GOAL #3   Title Patient will demonstrate a DGI score of greater than 24/30 to demonstrate decreased risk of falling with ambulation.    Baseline 19   Time 6   Period Weeks   Status Deferred   PT LONG TERM GOAL #4   Title Patient will demonstrate a TUG score of less than 13 seconds with no AD to demonstrate improved ambulatory balance and safety.    Baseline 12.5 seconds with SPC   Time 6   Period Weeks   Status Achieved               Plan - 02/22/15 1116    Clinical Impression Statement pt. demonstrates improvement as demonstrated by decreasd pain durining shopping/ADLs. pt. lacks extension as a primary limitation in addition to decreased kneee extension strength. Gait with decr. extension and decr. stance time on involved side. Noted decr. extension today compared with previous sessions possibly due to incr. activity.   Pt will benefit from skilled therapeutic  intervention in order to improve on the following deficits Abnormal gait;Decreased activity tolerance;Decreased balance;Decreased strength;Decreased mobility;Decreased knowledge of use of DME;Decreased endurance;Difficulty walking;Impaired flexibility   Rehab Potential Good   PT Frequency 2x / week   PT Duration 6 weeks   PT Treatment/Interventions Moist Heat;Therapeutic exercise;Therapeutic activities;Manual techniques;Balance training;Stair training;Gait training;Neuromuscular re-education   PT Next Visit Plan Progress dynamic balance and LE strengthening.    Consulted and Agree with Plan of Care  Patient        Problem List Patient Active Problem List   Diagnosis Date Noted  . OA (osteoarthritis) of knee 05/31/2014  . Hyperlipemia 06/08/2013  . Ischemic cardiomyopathy 05/27/2013  . Hypotension 05/27/2013  . VT (ventricular tachycardia) ([place]) 05/25/2013  . Hypokalemia 05/25/2013  . Hypothyroid   . STEMI (ST elevation myocardial infarction) ([place]) 05/24/2013  . Preoperative clearance 02/27/2012  . Takotsubo cardiomyopathy 05/15/2011  . Coronary artery disease 10/06/2010  . ASTHMA, EXTRINSIC 07/15/2008  . BREAST CANCER 06/07/2008  . HYPERLIPIDEMIA 06/07/2008  . HYPERTENSION 06/07/2008  . MYOCARDIAL INFARCTION 06/07/2008  . ALLERGIC RHINITIS 06/07/2008  . G E R D 06/07/2008  . DYSPNEA 06/07/2008    Chanah Tidmore PT DPT 02/22/2015, 11:22 AM  [place] [facility] [address] [place], [address] Phone: [phone]   Fax:  [phone]  Name: [name] MRN: [id number] Date of Birth: February 13, 1941

## 2015-02-24 ENCOUNTER — Ambulatory Visit: Payer: Medicare Other | Admitting: Physical Therapy

## 2015-02-24 DIAGNOSIS — R262 Difficulty in walking, not elsewhere classified: Secondary | ICD-10-CM | POA: Diagnosis not present

## 2015-02-24 DIAGNOSIS — Z96651 Presence of right artificial knee joint: Secondary | ICD-10-CM

## 2015-02-24 NOTE — Therapy (Signed)
[place] [facility] [address], [place], [address] Phone: [phone]   Fax:  [phone]  Physical Therapy Treatment  Patient Details  Name: [name] MRN: [id number] Date of Birth: 01-31-1941 No Data Recorded  Encounter Date: 02/24/2015      PT End of Session - 02/24/15 1228    Visit Number 12   Number of Visits 13   Date for PT Re-Evaluation 03/21/15   Authorization Type G Code 6   Authorization - Visit Number 10   PT Start Time [id number]   PT Stop Time 1227   PT Time Calculation (min) 42 min   Activity Tolerance Patient tolerated treatment well;Patient limited by pain   Behavior During Therapy [facility] for tasks assessed/performed      Past Medical History  Diagnosis Date  . Allergic rhinitis   . Breast cancer ([place]) 1999, 2008    s/p bilat mastectomy  . Pulmonary nodules     no change CT chest 3/10  . GERD (gastroesophageal reflux disease)   . HTN (hypertension)   . Hyperlipidemia   . NSVT (nonsustained ventricular tachycardia) ([place])     a. 05/2013 after MI->amio caused nausea->BB only.  . Hypothyroid   . Ischemic cardiomyopathy     a. 05/2013 Echo: EF 40-45%, midinferior AK.  . Takotsubo cardiomyopathy     a. 2013 with subsequent recovery of LV fxn (followed by MI in 05/2013 with LV dysfxn).  Marland Kitchen CAD (coronary artery disease)     a. H/o nonobs dzs and vasospasm with takotsubo CM;  b. 05/2013 Inf STEMI/Cath: LM nl, LAD nl, LCX nondom 100d (failed PCI), RCA dominant, nl, RPDA 66m (41mm vessel-->Med Rx), EF 35-40%.  . Coronary vasospasm ([place])     some chest pain "last chest pressure episode 12'15 -evaluated at [facility], multiple testing-ruleout MI, "thought anxiety"  . DVT of popliteal vein ([facility])     left knee- caused bilateral Pulmonary emboli" Coumadin"  . Depression   . Anxiety   . Pulmonary emboli ([place])     history of dx. 07-05-13 [facility]  . Myocardial infarction ([place]) 05/2013  . Arthritis   . Dysrhythmia    NSVT    Past Surgical History  Procedure Laterality Date  . Bilateral mastectomy  1999, 2008  . Nasal sinus surgery  1972  . Thyroidectomy  1965  . Tonsillectomy  1950s  . Cardiac catheterization  04/2011    [facility] in [place]  . Left heart catheterization with coronary angiogram Bilateral 05/24/2013    Procedure: LEFT HEART CATHETERIZATION WITH CORONARY ANGIOGRAM;  Surgeon: Lorretta Harp, MD;  Location: [facility] CATH LAB;  Service: Cardiovascular;  Laterality: Bilateral;  . Tonsillectomy    . Breast surgery      bilateral mastectomy  . Knee arthroscopy Right   . Partial knee arthroplasty Left 05/31/2014    Procedure: UNICOMPARTMENTAL LEFT KNEE;  Surgeon: Gaynelle Arabian, MD;  Location: [facility];  Service: Orthopedics;  Laterality: Left;  . Partial knee arthroplasty Right 12/13/2014    Procedure: RIGHT KNEE UNICOMPARTMENTAL ARTHROPLASTY;  Surgeon: Gaynelle Arabian, MD;  Location: [facility];  Service: Orthopedics;  Laterality: Right;    There were no vitals filed for this visit.  Visit Diagnosis:  Difficulty walking  Status post total right knee replacement      Subjective Assessment - 02/24/15 1211    Subjective Pt reports she felt very good after last session, decr. pain in knee but incr. activity.   Limitations  Sitting;Walking;Standing;House hold activities   Patient Stated Goals To increase her ambulation tolerance and decrease pain.    Currently in Pain? No/denies   Pain Score 0-No pain   Pain Onset More than a month ago           Objective: Extensive  STM performed on distal quad and HS.   Tib/femur AP, PA glides 5x1 min each.  distration with knee flex/ext in sitting x4 min total.  TG 3x20 leg press with B LE, 3x5 leg press with RLE.  Sit<>stand performed and educated on importance of this.  Educated pt on performance of HEP with possible discharge. No further appointments scheduled pending return to MD. Pt fatigued at the end of session but primary concern at this  point is her pain, which is improving. With STM today ROM improved by 15 degr. Flexion, 5 degr. Ext. Able to perform TG exercise with decr. Cuing and pain.                      PT Education - 02/24/15 1227    Education provided Yes   Education Details instructed in HEP with d/c from PT   Person(s) Educated Patient   Methods Explanation   Comprehension Verbalized understanding             PT Long Term Goals - 02/17/15 1219    PT LONG TERM GOAL #1   Title Patient will report an LEFS score greater than 40 to demonstrate improved tolerance for ADLs and functional activity.   Baseline 29   Time 6   Period Weeks   Status Achieved   PT LONG TERM GOAL #2   Title Patient will demonstrate a BERG balance score of greater than 48/56 to demonstrate increased safety with mobility.   Baseline 41   Time 6   Period Weeks   Status Achieved   PT LONG TERM GOAL #3   Title Patient will demonstrate a DGI score of greater than 24/30 to demonstrate decreased risk of falling with ambulation.    Baseline 19   Time 6   Period Weeks   Status Deferred   PT LONG TERM GOAL #4   Title Patient will demonstrate a TUG score of less than 13 seconds with no AD to demonstrate improved ambulatory balance and safety.    Baseline 12.5 seconds with SPC   Time 6   Period Weeks   Status Achieved               Plan - 02/24/15 1228    Clinical Impression Statement At this time pt is appropriate for d/c as she has symmerical extension (-4 deg) and flexion to 112, strength is returning and pt is I with her HEP. pt would benefit from return to [facility] [employer] exercise program.   Pt will benefit from skilled therapeutic intervention in order to improve on the following deficits Abnormal gait;Decreased activity tolerance;Decreased balance;Decreased strength;Decreased mobility;Decreased knowledge of use of DME;Decreased endurance;Difficulty walking;Impaired flexibility   Rehab Potential Good   PT  Frequency 2x / week   PT Duration 6 weeks   PT Treatment/Interventions Moist Heat;Therapeutic exercise;Therapeutic activities;Manual techniques;Balance training;Stair training;Gait training;Neuromuscular re-education   PT Next Visit Plan Progress dynamic balance and LE strengthening.    Consulted and Agree with Plan of Care Patient        Problem List Patient Active Problem List   Diagnosis Date Noted  . OA (osteoarthritis) of knee 05/31/2014  . Hyperlipemia 06/08/2013  .  Ischemic cardiomyopathy 05/27/2013  . Hypotension 05/27/2013  . VT (ventricular tachycardia) ([place]) 05/25/2013  . Hypokalemia 05/25/2013  . Hypothyroid   . STEMI (ST elevation myocardial infarction) ([place]) 05/24/2013  . Preoperative clearance 02/27/2012  . Takotsubo cardiomyopathy 05/15/2011  . Coronary artery disease 10/06/2010  . ASTHMA, EXTRINSIC 07/15/2008  . BREAST CANCER 06/07/2008  . HYPERLIPIDEMIA 06/07/2008  . HYPERTENSION 06/07/2008  . MYOCARDIAL INFARCTION 06/07/2008  . ALLERGIC RHINITIS 06/07/2008  . G E R D 06/07/2008  . DYSPNEA 06/07/2008    Fisher,Benjamin PT DPT 02/24/2015, 12:33 PM  [place] [facility] [address] [place], [address] Phone: [phone]   Fax:  [phone]  Name: OLESIA MCKINSTRY MRN: [id number] Date of Birth: 1940-10-23

## 2017-07-16 ENCOUNTER — Encounter: Payer: Self-pay | Admitting: *Deleted

## 2017-07-17 ENCOUNTER — Ambulatory Visit: Payer: Medicare Other | Admitting: Anesthesiology

## 2017-07-17 ENCOUNTER — Other Ambulatory Visit: Payer: Self-pay

## 2017-07-17 ENCOUNTER — Encounter
Admission: RE | Disposition: A | Discharge status: Home / Self Care | Payer: Self-pay | Source: Ambulatory Visit | Attending: Unknown Physician Specialty

## 2017-07-17 ENCOUNTER — Encounter: Payer: Self-pay | Admitting: *Deleted

## 2017-07-17 ENCOUNTER — Ambulatory Visit
Admission: RE | Admit: 2017-07-17 | Discharge: 2017-07-17 | Disposition: A | Discharge status: Home / Self Care | Payer: Medicare Other | Source: Ambulatory Visit | Attending: Unknown Physician Specialty | Admitting: Unknown Physician Specialty

## 2017-07-17 DIAGNOSIS — Z791 Long term (current) use of non-steroidal anti-inflammatories (NSAID): Secondary | ICD-10-CM | POA: Diagnosis not present

## 2017-07-17 DIAGNOSIS — I739 Peripheral vascular disease, unspecified: Secondary | ICD-10-CM | POA: Diagnosis not present

## 2017-07-17 DIAGNOSIS — Z79899 Other long term (current) drug therapy: Secondary | ICD-10-CM | POA: Insufficient documentation

## 2017-07-17 DIAGNOSIS — Z7982 Long term (current) use of aspirin: Secondary | ICD-10-CM | POA: Diagnosis not present

## 2017-07-17 DIAGNOSIS — K5730 Diverticulosis of large intestine without perforation or abscess without bleeding: Secondary | ICD-10-CM | POA: Insufficient documentation

## 2017-07-17 DIAGNOSIS — Z79890 Hormone replacement therapy (postmenopausal): Secondary | ICD-10-CM | POA: Diagnosis not present

## 2017-07-17 DIAGNOSIS — I252 Old myocardial infarction: Secondary | ICD-10-CM | POA: Insufficient documentation

## 2017-07-17 DIAGNOSIS — F419 Anxiety disorder, unspecified: Secondary | ICD-10-CM | POA: Insufficient documentation

## 2017-07-17 DIAGNOSIS — E785 Hyperlipidemia, unspecified: Secondary | ICD-10-CM | POA: Diagnosis not present

## 2017-07-17 DIAGNOSIS — K640 First degree hemorrhoids: Secondary | ICD-10-CM | POA: Insufficient documentation

## 2017-07-17 DIAGNOSIS — I2510 Atherosclerotic heart disease of native coronary artery without angina pectoris: Secondary | ICD-10-CM | POA: Diagnosis not present

## 2017-07-17 DIAGNOSIS — I10 Essential (primary) hypertension: Secondary | ICD-10-CM | POA: Insufficient documentation

## 2017-07-17 DIAGNOSIS — Z8371 Family history of colonic polyps: Secondary | ICD-10-CM | POA: Diagnosis not present

## 2017-07-17 DIAGNOSIS — Z1211 Encounter for screening for malignant neoplasm of colon: Secondary | ICD-10-CM | POA: Insufficient documentation

## 2017-07-17 DIAGNOSIS — F329 Major depressive disorder, single episode, unspecified: Secondary | ICD-10-CM | POA: Diagnosis not present

## 2017-07-17 DIAGNOSIS — K219 Gastro-esophageal reflux disease without esophagitis: Secondary | ICD-10-CM | POA: Diagnosis not present

## 2017-07-17 DIAGNOSIS — Z86718 Personal history of other venous thrombosis and embolism: Secondary | ICD-10-CM | POA: Insufficient documentation

## 2017-07-17 DIAGNOSIS — E039 Hypothyroidism, unspecified: Secondary | ICD-10-CM | POA: Insufficient documentation

## 2017-07-17 DIAGNOSIS — Z96653 Presence of artificial knee joint, bilateral: Secondary | ICD-10-CM | POA: Insufficient documentation

## 2017-07-17 DIAGNOSIS — I255 Ischemic cardiomyopathy: Secondary | ICD-10-CM | POA: Diagnosis not present

## 2017-07-17 DIAGNOSIS — Z86711 Personal history of pulmonary embolism: Secondary | ICD-10-CM | POA: Diagnosis not present

## 2017-07-17 DIAGNOSIS — Z853 Personal history of malignant neoplasm of breast: Secondary | ICD-10-CM | POA: Insufficient documentation

## 2017-07-17 DIAGNOSIS — Z9013 Acquired absence of bilateral breasts and nipples: Secondary | ICD-10-CM | POA: Insufficient documentation

## 2017-07-17 DIAGNOSIS — Z7901 Long term (current) use of anticoagulants: Secondary | ICD-10-CM | POA: Diagnosis not present

## 2017-07-17 HISTORY — DX: Unspecified asthma, uncomplicated: J45.909

## 2017-07-17 HISTORY — DX: Cervicalgia: M54.2

## 2017-07-17 HISTORY — PX: COLONOSCOPY WITH PROPOFOL: SHX5780

## 2017-07-17 HISTORY — DX: Hallux valgus (acquired), unspecified foot: M20.10

## 2017-07-17 HISTORY — DX: Anemia, unspecified: D64.9

## 2017-07-17 SURGERY — COLONOSCOPY WITH PROPOFOL
Anesthesia: General

## 2017-07-17 MED ORDER — PIPERACILLIN-TAZOBACTAM 3.375 G IVPB
INTRAVENOUS | Status: AC
  Filled 2017-07-17: qty 50

## 2017-07-17 MED ORDER — SODIUM CHLORIDE 0.9 % IV SOLN: INTRAVENOUS | Status: DC

## 2017-07-17 MED ORDER — PIPERACILLIN-TAZOBACTAM 3.375 G IVPB 30 MIN
3.3750 g | Freq: Once | INTRAVENOUS | Status: AC
  Administered 2017-07-17: 3.375 g via INTRAVENOUS
  Filled 2017-07-17: qty 50

## 2017-07-17 MED ORDER — GLYCOPYRROLATE 0.2 MG/ML IJ SOLN
INTRAMUSCULAR | Status: DC | PRN
  Administered 2017-07-17: 0.2 mg via INTRAVENOUS

## 2017-07-17 MED ORDER — SODIUM CHLORIDE 0.9 % IV SOLN
INTRAVENOUS | Status: DC
  Administered 2017-07-17 (×2): via INTRAVENOUS

## 2017-07-17 MED ORDER — PROPOFOL 500 MG/50ML IV EMUL
INTRAVENOUS | Status: DC | PRN
  Administered 2017-07-17: 75 ug/kg/min via INTRAVENOUS

## 2017-07-17 MED ORDER — PROPOFOL 10 MG/ML IV BOLUS
INTRAVENOUS | Status: DC | PRN
  Administered 2017-07-17: 20 mg via INTRAVENOUS

## 2017-07-17 NOTE — Op Note (Signed)
[facility] Gastroenterology Patient Name: Kaden Dunkel Procedure Date: 07/17/2017 8:09 AM MRN: [id number] Account #: [id number] Date of Birth: 1940-05-10 Admit Type: Outpatient Age: 77 Room: [facility] ENDO ROOM 3 Gender: Female Note Status: Finalized Procedure:            Colonoscopy Indications:          Family history of colonic polyps in a first-degree                        relative Providers:            Manya Silvas, MD Referring MD:         Rusty Aus, MD (Referring MD) Medicines:            Propofol per Anesthesia Complications:        No immediate complications. Procedure:            Pre-Anesthesia Assessment:                       - After reviewing the risks and benefits, the patient                        was deemed in satisfactory condition to undergo the                        procedure.                       After obtaining informed consent, the colonoscope was                        passed under direct vision. Throughout the procedure,                        the patient's blood pressure, pulse, and oxygen                        saturations were monitored continuously. The                        Colonoscope was introduced through the anus and                        advanced to the the cecum, identified by appendiceal                        orifice and ileocecal valve. The colonoscopy was                        performed without difficulty. The patient tolerated the                        procedure well. The quality of the bowel preparation                        was excellent. Findings:      A few small-mouthed diverticula were found in the sigmoid colon.      Internal hemorrhoids were found during endoscopy. The hemorrhoids were       small and Grade I (internal hemorrhoids that do not prolapse).      The exam was otherwise  without abnormality. Impression:           - Diverticulosis in the sigmoid colon.                       - Internal  hemorrhoids.                       - The examination was otherwise normal.                       - No specimens collected. Recommendation:       - The findings and recommendations were discussed with                        the patient's family. Given no polyps on this and                        previous colonoscopy I do not recommend another exam                        unless bleeding or abd pain. Manya Silvas, MD 07/17/2017 9:01:24 AM This report has been signed electronically. Number of Addenda: 0 Note Initiated On: 07/17/2017 8:09 AM Scope Withdrawal Time: 0 hours 8 minutes 28 seconds  Total Procedure Duration: 0 hours 17 minutes 18 seconds       [facility]

## 2017-07-17 NOTE — Transfer of Care (Signed)
Immediate Anesthesia Transfer of Care Note  Patient: [name]  Procedure(s) Performed: COLONOSCOPY WITH PROPOFOL (N/A )  Patient Location: PACU  Anesthesia Type:General  Level of Consciousness: awake and alert   Airway & Oxygen Therapy: Patient Spontanous Breathing  Post-op Assessment: Report given to RN  Post vital signs: Reviewed and stable  Last Vitals:  Vitals Value Taken Time  BP 126/64 07/17/2017  9:01 AM  Temp 36.4 C 07/17/2017  9:01 AM  Pulse 64 07/17/2017  9:01 AM  Resp 16 07/17/2017  9:01 AM  SpO2      Last Pain:  Vitals:   07/17/17 0801  TempSrc: Tympanic  PainSc: 0-No pain         Complications: No apparent anesthesia complications

## 2017-07-17 NOTE — H&P (Signed)
Primary Care Physician:  Rusty Aus, MD Primary Gastroenterologist:  Dr. Vira Agar  Pre-Procedure History & Physical: HPI:  [name] is a 76 y.o. female is here for an colonoscopy. Screening colonoscopy .  FH colon polyps.    Past Medical History:  Diagnosis Date  . Allergic rhinitis   . Anemia   . Anxiety   . Arthritis    osteoarthritis  . Asthma   . Breast cancer ([place]) 1999, 2008   s/p bilat mastectomy  . CAD (coronary artery disease)    a. H/o nonobs dzs and vasospasm with takotsubo CM;  b. 05/2013 Inf STEMI/Cath: LM nl, LAD nl, LCX nondom 100d (failed PCI), RCA dominant, nl, RPDA 46m (2mm vessel-->Med Rx), EF 35-40%.  . Cervicalgia   . Coronary vasospasm (HCC)    some chest pain "last chest pressure episode 12'15 -evaluated at [facility], multiple testing-ruleout MI, "thought anxiety"  . Depression   . DVT of popliteal vein (HCC)    left knee- caused bilateral Pulmonary emboli" Coumadin"  . Dysrhythmia    NSVT  . GERD (gastroesophageal reflux disease)   . Hallux valgus   . HTN (hypertension)   . Hyperlipidemia   . Hypothyroid   . Ischemic cardiomyopathy    a. 05/2013 Echo: EF 40-45%, midinferior AK.  Marland Kitchen Myocardial infarction ([place]) 05/2013  . NSVT (nonsustained ventricular tachycardia) ([place])    a. 05/2013 after MI->amio caused nausea->BB only.  . Pulmonary emboli ([place])    history of dx. 07-05-13 [facility]  . Pulmonary nodules    no change CT chest 3/10  . Takotsubo cardiomyopathy    a. 2013 with subsequent recovery of LV fxn (followed by MI in 05/2013 with LV dysfxn).    Past Surgical History:  Procedure Laterality Date  . bilateral mastectomy  1999, 2008  . BREAST SURGERY     bilateral mastectomy  . CARDIAC CATHETERIZATION  04/2011   [facility] in [place]  . JOINT REPLACEMENT Bilateral    knee  . KNEE ARTHROSCOPY Right   . LEFT HEART CATHETERIZATION WITH CORONARY ANGIOGRAM Bilateral 05/24/2013   Procedure: LEFT HEART CATHETERIZATION WITH CORONARY ANGIOGRAM;   Surgeon: Lorretta Harp, MD;  Location: [facility] CATH LAB;  Service: Cardiovascular;  Laterality: Bilateral;  . MASTECTOMY Bilateral   . NASAL SINUS SURGERY  1972  . PARTIAL KNEE ARTHROPLASTY Left 05/31/2014   Procedure: UNICOMPARTMENTAL LEFT KNEE;  Surgeon: Gaynelle Arabian, MD;  Location: [facility] ORS;  Service: Orthopedics;  Laterality: Left;  . PARTIAL KNEE ARTHROPLASTY Right 12/13/2014   Procedure: RIGHT KNEE UNICOMPARTMENTAL ARTHROPLASTY;  Surgeon: Gaynelle Arabian, MD;  Location: [facility] ORS;  Service: Orthopedics;  Laterality: Right;  . THYROIDECTOMY  1965  . TONSILLECTOMY  1950s  . TONSILLECTOMY      Prior to Admission medications   Medication Sig Start Date End Date Taking? Authorizing Provider  acetaminophen (TYLENOL) 500 MG tablet Take 1,000 mg by mouth daily after lunch.    Yes [provider]  aspirin 81 MG EC tablet Take 81 mg by mouth at bedtime.    Yes [provider]  Calcium Citrate (CITRACAL PO) Take 1 tablet by mouth 3 (three) times daily.   Yes [provider]  carboxymethylcellulose (REFRESH PLUS) 0.5 % SOLN Place 2 drops into both eyes 2 (two) times daily as needed (dry eyes.).   Yes [provider]  carvedilol (COREG) 6.25 MG tablet Take 1 tablet (6.25 mg total) by mouth 2 (two) times daily with a meal. 06/08/13  Yes Gollan,  Kathlene November, MD  celecoxib (CELEBREX) 100 MG capsule Take 100 mg by mouth 2 (two) times daily.   Yes [provider]  cetirizine (ZYRTEC) 10 MG tablet Take 10 mg by mouth daily.   Yes [provider]  cholecalciferol (VITAMIN D) 1000 UNITS tablet Take 1,000 Units by mouth daily.   Yes [provider]  diazepam (VALIUM) 5 MG tablet Take 5 mg by mouth every 12 (twelve) hours as needed for anxiety.   Yes [provider]  diclofenac sodium (VOLTAREN) 1 % GEL Apply 2 g topically 4 (four) times daily.   Yes [provider]  docusate sodium (COLACE) 100 MG capsule Take 200 mg by mouth every evening.     Yes [provider]  escitalopram (LEXAPRO) 5 MG tablet Take 5 mg by mouth every morning.   Yes [provider]  Flaxseed, Linseed, (FLAX SEEDS) POWD Take 1 scoop by mouth daily.   Yes [provider]  levothyroxine (SYNTHROID, LEVOTHROID) 75 MCG tablet Take 1 tablet (75 mcg total) by mouth daily before breakfast. 05/29/13  Yes Barrett, Rhonda G, PA-C  magnesium oxide (MAG-OX) 400 MG tablet Take 400 mg by mouth daily after lunch.    Yes [provider]  meclizine (ANTIVERT) 25 MG tablet Take 25 mg by mouth 2 (two) times daily as needed for dizziness.   Yes [provider]  mometasone (NASONEX) 50 MCG/ACT nasal spray Place 2 sprays into the nose at bedtime.    Yes [provider]  nitroGLYCERIN (NITROSTAT) 0.4 MG SL tablet Place 1 tablet (0.4 mg total) under the tongue every 5 (five) minutes as needed for chest pain. May repeat three times 05/29/13  Yes Barrett, Evelene Croon, PA-C  pravastatin (PRAVACHOL) 40 MG tablet Take 40 mg by mouth daily.   Yes [provider]  ranitidine (ZANTAC) 150 MG tablet Take 150 mg by mouth at bedtime.    Yes [provider]  sodium chloride (OCEAN) 0.65 % SOLN nasal spray Place 2 sprays into both nostrils as needed for congestion.   Yes [provider]  vitamin B-12 (CYANOCOBALAMIN) 1000 MCG tablet Take 1,000 mcg by mouth daily.   Yes [provider]  warfarin (COUMADIN) 5 MG tablet Take 1 tablet (5 mg total) by mouth daily. Take Coumadin for three weeks for postoperative protocol and then the patient may resume their previous Coumadin home regimen.  The dose may need to be adjusted based upon the INR.  Please follow the INR and titrate Coumadin dose for a therapeutic range between 2.0 and 3.0 INR.  After completing the three weeks of Coumadin, the patient may resume their previous Coumadin home regimen. 12/14/14  Yes Perkins, Alexzandrew L, PA-C  enoxaparin (LOVENOX) 40 MG/0.4ML  injection Inject 0.4 mLs (40 mg total) into the skin daily. Continue Lovenox injections until the INR is therapeutic at or greater than 2.0.  When INR reaches the therapeutic level of equal to or greater than 2.0, the patient may discontinue the Lovenox injections. Patient not taking: Reported on 07/17/2017 12/14/14   Dara Lords, Alexzandrew L, PA-C  esomeprazole (NEXIUM) 40 MG capsule Take 40 mg by mouth daily as needed (GERD).     [provider]  fexofenadine (ALLEGRA) 180 MG tablet Take 180 mg by mouth daily.    [provider]  furosemide (LASIX) 20 MG tablet Take 1 tablet (20 mg total) by mouth daily. Patient not taking: Reported on 11/30/2014 05/29/13   Barrett, Evelene Croon, PA-C  HYDROmorphone (  DILAUDID) 2 MG tablet Take 1-2 tablets (2-4 mg total) by mouth every 4 (four) hours as needed for moderate pain or severe pain. Patient not taking: Reported on 07/17/2017 12/14/14   Dara Lords, Alexzandrew L, PA-C  methocarbamol (ROBAXIN) 500 MG tablet Take 1 tablet (500 mg total) by mouth every 6 (six) hours as needed for muscle spasms. Patient not taking: Reported on 07/17/2017 12/14/14   Dara Lords, Alexzandrew L, PA-C  metoprolol tartrate (LOPRESSOR) 25 MG tablet Take 12.5 mg by mouth 2 (two) times daily.    [provider]  ondansetron (ZOFRAN) 4 MG tablet Take 1 tablet (4 mg total) by mouth every 6 (six) hours as needed for nausea. Patient not taking: Reported on 07/17/2017 12/14/14   Dara Lords, Alexzandrew L, PA-C  spironolactone (ALDACTONE) 25 MG tablet Take 25 mg by mouth every morning.    [provider]  traMADol (ULTRAM) 50 MG tablet Take 1-2 tablets (50-100 mg total) by mouth every 6 (six) hours as needed (mild pain). Patient not taking: Reported on 07/17/2017 12/14/14   Dara Lords, Alexzandrew L, PA-C  warfarin (COUMADIN) 1 MG tablet Take 1 tablet (1 mg total) by mouth daily. Take Coumadin for three weeks for postoperative protocol and then the patient may resume their  previous Coumadin home regimen.  The dose may need to be adjusted based upon the INR.  Please follow the INR and titrate Coumadin dose for a therapeutic range between 2.0 and 3.0 INR.  After completing the three weeks of Coumadin, the patient may resume their previous Coumadin home regimen. 12/14/14   Perkins, Alexzandrew L, PA-C    Allergies as of 05/13/2017 - Review Complete 02/24/2015  Allergen Reaction Noted  . Hydromorphone Nausea Only 11/30/2014  . Amiodarone Nausea And Vomiting 05/21/2014  . Codeine Nausea And Vomiting   . Hydrocodone Nausea And Vomiting   . Morphine Nausea And Vomiting   . Prednisone  04/09/2013  . Statins Other (See Comments) 03/24/2009  . Procaine hcl Palpitations     Family History  Problem Relation Age of Onset  . Stroke Mother   . Other Mother        rheumatism  . Heart attack Father   . Melanoma Brother   . Heart attack Brother   . Heart attack Brother   . Leukemia Brother     Social History   Socioeconomic History  . Marital status: Married    Spouse name: Not on file  . Number of children: Not on file  . Years of education: Not on file  . Highest education level: Not on file  Occupational History  . Occupation: Retired Education officer, museum  Social Needs  . Financial resource strain: Not on file  . Food insecurity:    Worry: Not on file    Inability: Not on file  . Transportation needs:    Medical: Not on file    Non-medical: Not on file  Tobacco Use  . Smoking status: Never Smoker  . Smokeless tobacco: Never Used  Substance and Sexual Activity  . Alcohol use: No  . Drug use: No  . Sexual activity: Never  Lifestyle  . Physical activity:    Days per week: Not on file    Minutes per session: Not on file  . Stress: Not on file  Relationships  . Social connections:    Talks on phone: Not on file    Gets together: Not on file    Attends religious service: Not on file    Active member  of club or organization: Not on file    Attends  meetings of clubs or organizations: Not on file    Relationship status: Not on file  . Intimate partner violence:    Fear of current or ex partner: Not on file    Emotionally abused: Not on file    Physically abused: Not on file    Forced sexual activity: Not on file  Other Topics Concern  . Not on file  Social History Narrative   Acupuncturist. Widowed.     Review of Systems: See HPI, otherwise negative ROS  Physical Exam: BP (!) 153/72   Pulse 62   Temp (!) 97.4 F (36.3 C) (Tympanic)   Resp 18   Ht 5\' 5"  (1.651 m)   Wt 71.7 kg (158 lb)   SpO2 97%   BMI 26.29 kg/m  General:   Alert,  pleasant and cooperative in NAD Head:  Normocephalic and atraumatic. Neck:  Supple; no masses or thyromegaly. Lungs:  Clear throughout to auscultation.    Heart:  Regular rate and rhythm. Abdomen:  Soft, nontender and nondistended. Normal bowel sounds, without guarding, and without rebound.   Neurologic:  Alert and  oriented x4;  grossly normal neurologically.  Impression/Plan: [name] is here for an colonoscopy to be performed for FH colon polyps in mother.  Risks, benefits, limitations, and alternatives regarding  colonoscopy have been reviewed with the patient.  Questions have been answered.  All parties agreeable.   Gaylyn Cheers, MD  07/17/2017, 8:15 AM

## 2017-07-17 NOTE — Discharge Instructions (Signed)

## 2017-07-17 NOTE — Anesthesia Post-op Follow-up Note (Signed)
Anesthesia QCDR form completed.        

## 2017-07-17 NOTE — Anesthesia Preprocedure Evaluation (Signed)
Anesthesia Evaluation  Patient identified by MRN, date of birth, ID band Patient awake    Reviewed: Allergy & Precautions, H&P , NPO status , Patient's Chart, lab work & pertinent test results, reviewed documented beta blocker date and time   Airway Mallampati: II   Neck ROM: full    Dental  (+) Poor Dentition   Pulmonary neg pulmonary ROS, shortness of breath, asthma ,    Pulmonary exam normal        Cardiovascular hypertension, + CAD, + Past MI and + Peripheral Vascular Disease  negative cardio ROS Normal cardiovascular exam+ dysrhythmias  Rhythm:regular Rate:Normal     Neuro/Psych PSYCHIATRIC DISORDERS Anxiety Depression negative neurological ROS  negative psych ROS   GI/Hepatic negative GI ROS, Neg liver ROS, GERD  ,  Endo/Other  negative endocrine ROSHypothyroidism   Renal/GU negative Renal ROS  negative genitourinary   Musculoskeletal   Abdominal   Peds  Hematology negative hematology ROS (+) anemia ,   Anesthesia Other Findings Past Medical History: No date: Allergic rhinitis No date: Anemia No date: Anxiety No date: Arthritis     Comment:  osteoarthritis No date: Asthma 1999, 2008: Breast cancer ([place])     Comment:  s/p bilat mastectomy No date: CAD (coronary artery disease)     Comment:  a. H/o nonobs dzs and vasospasm with takotsubo CM;  b.               05/2013 Inf STEMI/Cath: LM nl, LAD nl, LCX nondom 100d               (failed PCI), RCA dominant, nl, RPDA 33m (75mm               vessel-->Med Rx), EF 35-40%. No date: Cervicalgia No date: Coronary vasospasm (HCC)     Comment:  some chest pain "last chest pressure episode 12'15               -evaluated at [facility], multiple testing-ruleout MI, "thought              anxiety" No date: Depression No date: DVT of popliteal vein (HCC)     Comment:  left knee- caused bilateral Pulmonary emboli" Coumadin" No date: Dysrhythmia     Comment:  NSVT No date:  GERD (gastroesophageal reflux disease) No date: Hallux valgus No date: HTN (hypertension) No date: Hyperlipidemia No date: Hypothyroid No date: Ischemic cardiomyopathy     Comment:  a. 05/2013 Echo: EF 40-45%, midinferior AK. 05/2013: Myocardial infarction ([place]) No date: NSVT (nonsustained ventricular tachycardia) ([place])     Comment:  a. 05/2013 after MI->amio caused nausea->BB only. No date: Pulmonary emboli [facility])     Comment:  history of dx. 07-05-13 [facility] No date: Pulmonary nodules     Comment:  no change CT chest 3/10 No date: Takotsubo cardiomyopathy     Comment:  a. 2013 with subsequent recovery of LV fxn (followed by               MI in 05/2013 with LV dysfxn). Past Surgical History: 1999, 2008: bilateral mastectomy No date: BREAST SURGERY     Comment:  bilateral mastectomy 04/2011: CARDIAC CATHETERIZATION     Comment:  [facility] in [place] No date: JOINT REPLACEMENT; Bilateral     Comment:  knee No date: KNEE ARTHROSCOPY; Right 05/24/2013: LEFT HEART CATHETERIZATION WITH CORONARY ANGIOGRAM;  Bilateral     Comment:  Procedure: LEFT HEART CATHETERIZATION WITH CORONARY  ANGIOGRAM;  Surgeon: Lorretta Harp, MD;  Location: [facility]               CATH LAB;  Service: Cardiovascular;  Laterality:               Bilateral; No date: MASTECTOMY; Bilateral 1972: NASAL SINUS SURGERY 05/31/2014: PARTIAL KNEE ARTHROPLASTY; Left     Comment:  Procedure: UNICOMPARTMENTAL LEFT KNEE;  Surgeon: Gaynelle Arabian, MD;  Location: [facility] ORS;  Service: Orthopedics;                Laterality: Left; 12/13/2014: PARTIAL KNEE ARTHROPLASTY; Right     Comment:  Procedure: RIGHT KNEE UNICOMPARTMENTAL ARTHROPLASTY;                Surgeon: Gaynelle Arabian, MD;  Location: [facility]S;  Service:               Orthopedics;  Laterality: Right; 1965: THYROIDECTOMY 1950s: TONSILLECTOMY No date: TONSILLECTOMY   Reproductive/Obstetrics negative OB ROS                              Anesthesia Physical Anesthesia Plan  ASA: III  Anesthesia Plan: General   Post-op Pain Management:    Induction:   PONV Risk Score and Plan:   Airway Management Planned:   Additional Equipment:   Intra-op Plan:   Post-operative Plan:   Informed Consent: I have reviewed the patients History and Physical, chart, labs and discussed the procedure including the risks, benefits and alternatives for the proposed anesthesia with the patient or authorized representative who has indicated his/her understanding and acceptance.   Dental Advisory Given  Plan Discussed with: CRNA  Anesthesia Plan Comments:         Anesthesia Quick Evaluation

## 2017-07-17 NOTE — Anesthesia Postprocedure Evaluation (Signed)
Anesthesia Post Note  Patient: [name]  Procedure(s) Performed: COLONOSCOPY WITH PROPOFOL (N/A )  Patient location during evaluation: PACU Anesthesia Type: General Level of consciousness: awake and alert Pain management: pain level controlled Vital Signs Assessment: post-procedure vital signs reviewed and stable Respiratory status: spontaneous breathing, nonlabored ventilation, respiratory function stable and patient connected to nasal cannula oxygen Cardiovascular status: blood pressure returned to baseline and stable Postop Assessment: no apparent nausea or vomiting Anesthetic complications: no     Last Vitals:  Vitals:   07/17/17 0920 07/17/17 0930  BP: 131/69 135/70  Pulse: 60 (!) 58  Resp: (!) 22 15  Temp:    SpO2: 97% 99%    Last Pain:  Vitals:   07/17/17 0930  TempSrc:   PainSc: 0-No pain                 Molli Barrows

## 2017-07-18 ENCOUNTER — Encounter: Payer: Self-pay | Admitting: Unknown Physician Specialty

## 2017-12-10 ENCOUNTER — Encounter: Payer: Medicare Other | Attending: Internal Medicine | Admitting: Dietician

## 2017-12-10 ENCOUNTER — Encounter: Payer: Self-pay | Admitting: Dietician

## 2017-12-10 VITALS — Ht 64.0 in | Wt 158.9 lb

## 2017-12-10 DIAGNOSIS — E1140 Type 2 diabetes mellitus with diabetic neuropathy, unspecified: Secondary | ICD-10-CM

## 2017-12-10 DIAGNOSIS — E119 Type 2 diabetes mellitus without complications: Secondary | ICD-10-CM | POA: Diagnosis not present

## 2017-12-10 DIAGNOSIS — Z713 Dietary counseling and surveillance: Secondary | ICD-10-CM | POA: Diagnosis present

## 2017-12-10 NOTE — Patient Instructions (Addendum)
  Check blood sugars 2 x day before breakfast and 2 hrs after supper every day  Bring blood sugar records to the next appointment/class  Exercise: continue exercising 45-60 min  3x/wk  Eat 3 meals day 1 snack a day at bedtime  Eat 2-3 carbohydrate servings/meal + protein  Eat 1 carbohydrate serving/snack + protein  Space meals 4-5 hours apart  Avoid sugar sweetened drinks (soda, tea, coffee, sports drinks, juices)  Drink plenty of water  Limit intake of sweets, fried foods and snack foods  Get a Sharps container  Return for appointment/classes on: 01-20-18

## 2017-12-10 NOTE — Progress Notes (Signed)
Diabetes Self-Management Education  Visit Type: First/Initial  Appt. Start Time: 1330 Appt. End Time: 1324  12/10/2017  [name], identified by name and date of birth, is a 77 y.o. female with a diagnosis of Diabetes: Type 2.   ASSESSMENT  Height 5\' 4"  (1.626 m), weight 158 lb 14.4 oz (72.1 kg). Body mass index is 27.28 kg/m.  Diabetes Self-Management Education - 12/10/17 1525      Visit Information   Visit Type  First/Initial      Initial Visit   Diabetes Type  Type 2      Health Coping   How would you rate your overall health?  Good      Psychosocial Assessment   Patient Belief/Attitude about Diabetes  Motivated to manage diabetes    Self-care barriers  None    Self-management support  Doctor's office;Family    Other persons present  Patient;Spouse/SO    Patient Concerns  Weight Control;Glycemic Control;Nutrition/Meal planning   become more fit, prevent complications.   Special Needs  None    Preferred Learning Style  Hands on;Visual;Auditory    Learning Readiness  Ready    What is the last grade level you completed in school?  masters degree      Pre-Education Assessment   Patient understands the diabetes disease and treatment process.  Needs Instruction    Patient understands incorporating nutritional management into lifestyle.  Needs Instruction    Patient undertands incorporating physical activity into lifestyle.  Needs Instruction    Patient understands using medications safely.  Needs Instruction    Patient understands monitoring blood glucose, interpreting and using results  Needs Review    Patient understands prevention, detection, and treatment of acute complications.  Needs Instruction    Patient understands prevention, detection, and treatment of chronic complications.  Needs Instruction    Patient understands how to develop strategies to address psychosocial issues.  Needs Instruction    Patient understands how to develop strategies to promote  health/change behavior.  Needs Instruction      Complications   Last HgB A1C per patient/outside source  6.8 %   11-15-17   How often do you check your blood sugar?  1-2 times/day    Fasting Blood glucose range (mg/dL)  70-129;130-179    Have you had a dilated eye exam in the past 12 months?  Yes   2 wks ago   Have you had a dental exam in the past 12 months?  Yes   2 months ago-goes every 6 months   Are you checking your feet?  No      Dietary Intake   Breakfast  eats cereal/1 % milk, nuts and fruit at 9a-10a    Snack (morning)  occasionally eats small square of chocolate    Lunch  eats lunch at 1:30-2:30p    Snack (afternoon)  usually eats small square of chocolate or cheese crispies    Dinner  eats supper at 6p-7p; eats fried foods 2-3x/wk    Snack (evening)  eats popcorn    Beverage(s)  drinks water 4-5x/day, sugar free drinks 8+x/day and milk 5+x/day      Exercise   Exercise Type  Light (walking / raking leaves)   eliptical, bike & biceps, triceps, curls   How many days per week to you exercise?  3    How many minutes per day do you exercise?  52    Total minutes per week of exercise  156  Patient Education   Previous Diabetes Education  No    Disease state   Explored patient's options for treatment of their diabetes;Factors that contribute to the development of diabetes;Definition of diabetes, type 1 and 2, and the diagnosis of diabetes    Nutrition management   Role of diet in the treatment of diabetes and the relationship between the three main macronutrients and blood glucose level;Food label reading, portion sizes and measuring food.;Carbohydrate counting    Physical activity and exercise   Role of exercise on diabetes management, blood pressure control and cardiac health.;Helped patient identify appropriate exercises in relation to his/her diabetes, diabetes complications and other health issue.    Monitoring  Purpose and frequency of SMBG.;Yearly dilated eye  exam;Taught/discussed recording of test results and interpretation of SMBG.;Identified appropriate SMBG and/or A1C goals.    Chronic complications  Relationship between chronic complications and blood glucose control;Retinopathy and reason for yearly dilated eye exams;Reviewed with patient heart disease, higher risk of, and prevention;Dental care;Lipid levels, blood glucose control and heart disease    Personal strategies to promote health  Lifestyle issues that need to be addressed for better diabetes care;Helped patient develop diabetes management plan for (enter comment)      Outcomes   Expected Outcomes  Demonstrated interest in learning. Expect positive outcomes       Individualized Plan for Diabetes Self-Management Training:   Learning Objective:  Patient will have a greater understanding of diabetes self-management. Patient education plan is to attend individual and/or group sessions per assessed needs and concerns.   Plan:   Patient Instructions   Check blood sugars 2 x day before breakfast and 2 hrs after supper every day  Bring blood sugar records to the next appointment/class  Exercise: continue exercising 45-60 min  3x/wk  Eat 3 meals day 1 snack a day at bedtime  Eat 2-3 carbohydrate servings/meal + protein  Eat 1 carbohydrate serving/snack + protein  Space meals 4-5 hours apart  Avoid sugar sweetened drinks (soda, tea, coffee, sports drinks, juices)  Drink plenty of water  Limit intake of sweets, fried foods and snack foods  Get a Sharps container  Return for appointment/classes on: 01-20-18   Expected Outcomes:  Demonstrated interest in learning. Expect positive outcomes  Education material provided: General meal planning guidelines, Food Group handout  If problems or questions, patient to contact team via:  [phone]  Future DSME appointment:  01-20-18

## 2018-01-15 ENCOUNTER — Other Ambulatory Visit: Payer: Self-pay | Admitting: Ophthalmology

## 2018-01-15 DIAGNOSIS — H04123 Dry eye syndrome of bilateral lacrimal glands: Secondary | ICD-10-CM

## 2018-01-20 ENCOUNTER — Encounter: Payer: Medicare Other | Attending: Internal Medicine | Admitting: Dietician

## 2018-01-20 ENCOUNTER — Encounter: Payer: Self-pay | Admitting: Dietician

## 2018-01-20 VITALS — Wt 159.8 lb

## 2018-01-20 DIAGNOSIS — E1140 Type 2 diabetes mellitus with diabetic neuropathy, unspecified: Secondary | ICD-10-CM

## 2018-01-20 DIAGNOSIS — Z713 Dietary counseling and surveillance: Secondary | ICD-10-CM | POA: Diagnosis not present

## 2018-01-20 DIAGNOSIS — E119 Type 2 diabetes mellitus without complications: Secondary | ICD-10-CM | POA: Insufficient documentation

## 2018-01-20 NOTE — Progress Notes (Signed)
Appt. Start Time: 5:30pm Appt. End Time:8:30pm  Class 1 Diabetes Overview - define DM; state own type of DM; identify functions of pancreas and insulin; define insulin deficiency vs insulin resistance  Psychosocial - identify DM as a source of stress; state the effects of stress on BG control  Nutritional Management - describe effects of food on blood glucose; identify sources of carbohydrate, protein and fat; verbalize the importance of balance meals in controlling blood glucose  Exercise - describe the effects of exercise on blood glucose and importance of regular exercise in controlling diabetes; state a plan for personal exercise; verbalize contraindications for exercise  Self-Monitoring - state importance of SMBG; use SMBG results to effectively manage diabetes; identify importance of regular HbA1C testing and goals for results  Acute Complications - recognize hyperglycemia and hypoglycemia with causes and effects; identify blood glucose results as high, low or in control; list steps in treating and preventing high and low blood glucose  Chronic Complications/Foot, Skin, Eye Dental Care - identify possible long-term complications of diabetes (retinopathy, neuropathy, nephropathy, cardiovascular disease, infections); state importance of daily self-foot exams; describe how to examine feet and what to look for; explain appropriate eye and dental care  Lifestyle Changes/Goals & Health/Community Resources - state benefits of making appropriate lifestyle changes; identify habits that need to change (meals, tobacco, alcohol); identify strategies to reduce risk factors for personal health  Pregnancy/Sexual Health - define gestational diabetes; state importance of good blood glucose control and birth control prior to pregnancy; state importance of good blood glucose control in preventing sexual problems (impotence, vaginal dryness, infections, loss of desire); state relationship of blood glucose  control and pregnancy outcome; describe risk of maternal and fetal complications  Teaching Materials Used: Class 1 Slides/Notebook Diabetes Booklet ID Card  Medic Alert/Medic ID Forms Sleep Evaluation Exercise Handout Planning a Balanced Meal Goals for Class 1

## 2018-01-23 ENCOUNTER — Ambulatory Visit
Admission: RE | Admit: 2018-01-23 | Discharge: 2018-01-23 | Disposition: A | Discharge status: Home / Self Care | Payer: Medicare Other | Source: Ambulatory Visit | Attending: Ophthalmology | Admitting: Ophthalmology

## 2018-01-23 DIAGNOSIS — H04123 Dry eye syndrome of bilateral lacrimal glands: Secondary | ICD-10-CM | POA: Insufficient documentation

## 2018-01-23 MED ORDER — SODIUM PERTECHNETATE TC 99M INJECTION
0.5000 | Freq: Once | INTRAVENOUS | Status: AC | PRN
  Administered 2018-01-23: 0.361 via INTRAVENOUS

## 2018-01-27 ENCOUNTER — Encounter: Payer: Self-pay | Admitting: Dietician

## 2018-01-27 ENCOUNTER — Encounter: Payer: Medicare Other | Admitting: Dietician

## 2018-01-27 VITALS — Wt 159.2 lb

## 2018-01-27 DIAGNOSIS — E1140 Type 2 diabetes mellitus with diabetic neuropathy, unspecified: Secondary | ICD-10-CM

## 2018-01-27 DIAGNOSIS — Z713 Dietary counseling and surveillance: Secondary | ICD-10-CM | POA: Diagnosis not present

## 2018-01-27 NOTE — Progress Notes (Signed)

## 2018-02-03 ENCOUNTER — Encounter: Payer: Medicare Other | Admitting: Dietician

## 2018-02-03 VITALS — BP 110/70 | Ht 64.0 in | Wt 160.0 lb

## 2018-02-03 DIAGNOSIS — E1140 Type 2 diabetes mellitus with diabetic neuropathy, unspecified: Secondary | ICD-10-CM

## 2018-02-03 DIAGNOSIS — Z713 Dietary counseling and surveillance: Secondary | ICD-10-CM | POA: Diagnosis not present

## 2018-02-04 ENCOUNTER — Encounter: Payer: Self-pay | Admitting: Dietician

## 2018-02-04 NOTE — Progress Notes (Signed)

## 2018-02-10 ENCOUNTER — Encounter: Payer: Self-pay | Admitting: Dietician

## 2018-11-03 ENCOUNTER — Encounter: Payer: Self-pay | Admitting: Dietician

## 2019-03-23 ENCOUNTER — Other Ambulatory Visit: Payer: Self-pay

## 2019-03-23 ENCOUNTER — Ambulatory Visit
Admission: RE | Admit: 2019-03-23 | Discharge: 2019-03-23 | Disposition: A | Discharge status: Home / Self Care | Payer: Medicare Other | Source: Ambulatory Visit | Attending: Internal Medicine | Admitting: Internal Medicine

## 2019-03-23 ENCOUNTER — Other Ambulatory Visit: Payer: Self-pay | Admitting: Internal Medicine

## 2019-03-23 DIAGNOSIS — M7989 Other specified soft tissue disorders: Secondary | ICD-10-CM | POA: Insufficient documentation

## 2019-09-26 ENCOUNTER — Other Ambulatory Visit (HOSPITAL_COMMUNITY): Payer: Self-pay | Admitting: Internal Medicine

## 2019-09-26 ENCOUNTER — Other Ambulatory Visit: Payer: Self-pay | Admitting: Internal Medicine

## 2019-09-26 DIAGNOSIS — M5412 Radiculopathy, cervical region: Secondary | ICD-10-CM

## 2019-10-14 ENCOUNTER — Ambulatory Visit: Payer: Medicare Other

## 2019-11-02 ENCOUNTER — Other Ambulatory Visit: Payer: Self-pay | Admitting: Internal Medicine

## 2019-11-19 ENCOUNTER — Other Ambulatory Visit: Payer: Self-pay

## 2019-11-19 ENCOUNTER — Ambulatory Visit
Admission: RE | Admit: 2019-11-19 | Discharge: 2019-11-19 | Disposition: A | Discharge status: Home / Self Care | Payer: Medicare Other | Source: Ambulatory Visit | Attending: Internal Medicine | Admitting: Internal Medicine

## 2019-11-19 DIAGNOSIS — M5412 Radiculopathy, cervical region: Secondary | ICD-10-CM | POA: Insufficient documentation

## 2020-12-06 ENCOUNTER — Encounter: Payer: Self-pay | Admitting: Ophthalmology

## 2020-12-20 NOTE — Discharge Instructions (Signed)

## 2020-12-21 ENCOUNTER — Encounter
Admission: RE | Disposition: A | Discharge status: Home / Self Care | Payer: Self-pay | Source: Home / Self Care | Attending: Ophthalmology

## 2020-12-21 ENCOUNTER — Ambulatory Visit
Admission: RE | Admit: 2020-12-21 | Discharge: 2020-12-21 | Disposition: A | Discharge status: Home / Self Care | Payer: Medicare Other | Attending: Ophthalmology | Admitting: Ophthalmology

## 2020-12-21 ENCOUNTER — Ambulatory Visit: Payer: Medicare Other | Admitting: Anesthesiology

## 2020-12-21 ENCOUNTER — Other Ambulatory Visit: Payer: Self-pay

## 2020-12-21 ENCOUNTER — Encounter: Payer: Self-pay | Admitting: Ophthalmology

## 2020-12-21 DIAGNOSIS — Z86718 Personal history of other venous thrombosis and embolism: Secondary | ICD-10-CM | POA: Insufficient documentation

## 2020-12-21 DIAGNOSIS — Z8249 Family history of ischemic heart disease and other diseases of the circulatory system: Secondary | ICD-10-CM | POA: Insufficient documentation

## 2020-12-21 DIAGNOSIS — Z7982 Long term (current) use of aspirin: Secondary | ICD-10-CM | POA: Diagnosis not present

## 2020-12-21 DIAGNOSIS — Z955 Presence of coronary angioplasty implant and graft: Secondary | ICD-10-CM | POA: Diagnosis not present

## 2020-12-21 DIAGNOSIS — I10 Essential (primary) hypertension: Secondary | ICD-10-CM | POA: Diagnosis not present

## 2020-12-21 DIAGNOSIS — E890 Postprocedural hypothyroidism: Secondary | ICD-10-CM | POA: Diagnosis not present

## 2020-12-21 DIAGNOSIS — H2512 Age-related nuclear cataract, left eye: Secondary | ICD-10-CM | POA: Insufficient documentation

## 2020-12-21 DIAGNOSIS — Z86711 Personal history of pulmonary embolism: Secondary | ICD-10-CM | POA: Insufficient documentation

## 2020-12-21 DIAGNOSIS — I252 Old myocardial infarction: Secondary | ICD-10-CM | POA: Diagnosis not present

## 2020-12-21 DIAGNOSIS — Z79890 Hormone replacement therapy (postmenopausal): Secondary | ICD-10-CM | POA: Insufficient documentation

## 2020-12-21 DIAGNOSIS — I2510 Atherosclerotic heart disease of native coronary artery without angina pectoris: Secondary | ICD-10-CM | POA: Insufficient documentation

## 2020-12-21 DIAGNOSIS — Z7901 Long term (current) use of anticoagulants: Secondary | ICD-10-CM | POA: Insufficient documentation

## 2020-12-21 DIAGNOSIS — Z79899 Other long term (current) drug therapy: Secondary | ICD-10-CM | POA: Diagnosis not present

## 2020-12-21 DIAGNOSIS — E785 Hyperlipidemia, unspecified: Secondary | ICD-10-CM | POA: Insufficient documentation

## 2020-12-21 DIAGNOSIS — I255 Ischemic cardiomyopathy: Secondary | ICD-10-CM | POA: Diagnosis not present

## 2020-12-21 DIAGNOSIS — Z885 Allergy status to narcotic agent status: Secondary | ICD-10-CM | POA: Diagnosis not present

## 2020-12-21 HISTORY — PX: CATARACT EXTRACTION W/PHACO: SHX586

## 2020-12-21 HISTORY — DX: Chronic cough: R05.3

## 2020-12-21 SURGERY — PHACOEMULSIFICATION, CATARACT, WITH IOL INSERTION
Anesthesia: Monitor Anesthesia Care | Site: Eye | Laterality: Left

## 2020-12-21 MED ORDER — SIGHTPATH DOSE#1 BSS IO SOLN
INTRAOCULAR | Status: DC | PRN
  Administered 2020-12-21: 80 mL via OPHTHALMIC

## 2020-12-21 MED ORDER — SIGHTPATH DOSE#1 BSS IO SOLN
INTRAOCULAR | Status: DC | PRN
  Administered 2020-12-21: 1 mL

## 2020-12-21 MED ORDER — FENTANYL CITRATE (PF) 100 MCG/2ML IJ SOLN
INTRAMUSCULAR | Status: DC | PRN
  Administered 2020-12-21: 50 ug via INTRAVENOUS

## 2020-12-21 MED ORDER — SIGHTPATH DOSE#1 NA HYALUR & NA CHOND-NA HYALUR IO KIT
PACK | INTRAOCULAR | Status: DC | PRN
  Administered 2020-12-21: 1 via OPHTHALMIC

## 2020-12-21 MED ORDER — TETRACAINE HCL 0.5 % OP SOLN
1.0000 [drp] | OPHTHALMIC | Status: DC | PRN
  Administered 2020-12-21 (×3): 1 [drp] via OPHTHALMIC

## 2020-12-21 MED ORDER — SIGHTPATH DOSE#1 BSS IO SOLN
INTRAOCULAR | Status: DC | PRN
  Administered 2020-12-21: 15 mL

## 2020-12-21 MED ORDER — ARMC OPHTHALMIC DILATING GEL
1.0000 "application " | OPHTHALMIC | Status: DC | PRN
  Administered 2020-12-21 (×3): 1 via OPHTHALMIC

## 2020-12-21 MED ORDER — CEFUROXIME OPHTHALMIC INJECTION 1 MG/0.1 ML
INJECTION | OPHTHALMIC | Status: DC | PRN
  Administered 2020-12-21: 0.1 mL via INTRACAMERAL

## 2020-12-21 MED ORDER — BRIMONIDINE TARTRATE-TIMOLOL 0.2-0.5 % OP SOLN
OPHTHALMIC | Status: DC | PRN
  Administered 2020-12-21: 1 [drp] via OPHTHALMIC

## 2020-12-21 MED ORDER — MIDAZOLAM HCL 2 MG/2ML IJ SOLN
INTRAMUSCULAR | Status: DC | PRN
  Administered 2020-12-21: 1 mg via INTRAVENOUS

## 2020-12-21 SURGICAL SUPPLY — 21 items
CANNULA ANT/CHMB 27G (MISCELLANEOUS) IMPLANT
CANNULA ANT/CHMB 27GA (MISCELLANEOUS) IMPLANT
GLOVE SRG 8 PF TXTR STRL LF DI (GLOVE) ×1 IMPLANT
GLOVE SURG ENC TEXT LTX SZ7.5 (GLOVE) ×2 IMPLANT
GLOVE SURG UNDER POLY LF SZ8 (GLOVE) ×2
GOWN STRL REUS W/ TWL LRG LVL3 (GOWN DISPOSABLE) ×2 IMPLANT
GOWN STRL REUS W/TWL LRG LVL3 (GOWN DISPOSABLE) ×4
LENS IOL ACRSF VT TRC 315 20.5 IMPLANT
LENS IOL ACRYSOF VIVITY 20.5 ×2 IMPLANT
LENS IOL VIVITY 315 20.5 ×1 IMPLANT
MARKER SKIN DUAL TIP RULER LAB (MISCELLANEOUS) ×2 IMPLANT
NDL CAPSULORHEX 25GA (NEEDLE) IMPLANT
NDL FILTER BLUNT 18X1 1/2 (NEEDLE) ×2 IMPLANT
NEEDLE CAPSULORHEX 25GA (NEEDLE) IMPLANT
NEEDLE FILTER BLUNT 18X 1/2SAF (NEEDLE) ×2
NEEDLE FILTER BLUNT 18X1 1/2 (NEEDLE) ×2 IMPLANT
PACK EYE AFTER SURG (MISCELLANEOUS) IMPLANT
SYR 3ML LL SCALE MARK (SYRINGE) ×4 IMPLANT
SYR TB 1ML LUER SLIP (SYRINGE) ×2 IMPLANT
WATER STERILE IRR 250ML POUR (IV SOLUTION) ×2 IMPLANT
WIPE NON LINTING 3.25X3.25 (MISCELLANEOUS) ×2 IMPLANT

## 2020-12-21 NOTE — Anesthesia Preprocedure Evaluation (Signed)
Anesthesia Evaluation  Patient identified by MRN, date of birth, ID band Patient awake    Reviewed: Allergy & Precautions, NPO status   Airway Mallampati: II  TM Distance: >3 FB     Dental   Pulmonary shortness of breath, asthma ,    Pulmonary exam normal        Cardiovascular hypertension, + CAD (vasospasm), + Past MI and +CHF (TTE 2015 - EF 40-45%)   Rhythm:Regular Rate:Normal  HLD   Neuro/Psych PSYCHIATRIC DISORDERS Anxiety Depression    GI/Hepatic GERD  ,  Endo/Other  Hypothyroidism   Renal/GU      Musculoskeletal  (+) Arthritis ,   Abdominal   Peds  Hematology  (+) anemia ,   Anesthesia Other Findings Hx breast cancer  Reproductive/Obstetrics                             Anesthesia Physical Anesthesia Plan  ASA: 3  Anesthesia Plan: MAC   Post-op Pain Management:    Induction: Intravenous  PONV Risk Score and Plan: TIVA, Midazolam and Treatment may vary due to age or medical condition  Airway Management Planned: Natural Airway and Nasal Cannula  Additional Equipment:   Intra-op Plan:   Post-operative Plan:   Informed Consent: I have reviewed the patients History and Physical, chart, labs and discussed the procedure including the risks, benefits and alternatives for the proposed anesthesia with the patient or authorized representative who has indicated his/her understanding and acceptance.       Plan Discussed with: CRNA  Anesthesia Plan Comments:         Anesthesia Quick Evaluation

## 2020-12-21 NOTE — Op Note (Signed)
LOCATION:  [place]   PREOPERATIVE DIAGNOSIS:  Nuclear sclerotic cataract of the left eye.  H25.12  POSTOPERATIVE DIAGNOSIS:  Nuclear sclerotic cataract of the left eye.   PROCEDURE:  Phacoemulsification with Toric posterior chamber intraocular lens placement of the left eye.  Ultrasound time: Procedure(s): CATARACT EXTRACTION PHACO AND INTRAOCULAR LENS PLACEMENT (IOC) LEFT VIVITY TORIC LENS 3.94 00:52.5 (Left)  LENS:   Implant Name Type Inv. Item Serial No. Manufacturer Lot No. LRB No. Used Action  LENS IOL ACRYSOF VIVITY 20.5 - [id number]  LENS IOL ACRYSOF VIVITY 20.5 [id number] ALCON  Left 1 Implanted     [id number] Toric intraocular lens with 1.5 diopters of cylindrical power with axis orientation at 178 degrees.     SURGEON:  Wyonia Hough, MD   ANESTHESIA:  Topical with tetracaine drops and 2% Xylocaine jelly, augmented with 1% preservative-free intracameral lidocaine.  COMPLICATIONS:  None.   DESCRIPTION OF PROCEDURE:  The patient was identified in the holding room and transported to the operating suite and placed in the supine position under the operating microscope.  The left eye was identified as the operative eye, and it was prepped and draped in the usual sterile ophthalmic fashion.    A clear-corneal paracentesis incision was made at the 1:30 position.  0.5 ml of preservative-free 1% lidocaine was injected into the anterior chamber. The anterior chamber was filled with Viscoat.  A 2.4 millimeter near clear corneal incision was then made at the 10:30 position.  A cystotome and capsulorrhexis forceps were then used to make a curvilinear capsulorrhexis.  Hydrodissection and hydrodelineation were then performed using balanced salt solution.   Phacoemulsification was then used in stop and chop fashion to remove the lens, nucleus and epinucleus.  The remaining cortex was aspirated using the irrigation and aspiration handpiece.  Provisc viscoelastic was then  placed into the capsular bag to distend it for lens placement.  The Verion digital marker was used to align the implant at the intended axis.   A Toric lens was then injected into the capsular bag.  It was rotated clockwise until the axis marks on the lens were approximately 15 degrees in the counterclockwise direction to the intended alignment.  The viscoelastic was aspirated from the eye using the irrigation aspiration handpiece.  Then, a Koch spatula through the sideport incision was used to rotate the lens in a clockwise direction until the axis markings of the intraocular lens were lined up with the Verion alignment.  Balanced salt solution was then used to hydrate the wounds. Cefuroxime 0.1 ml of a 10mg /ml solution was injected into the anterior chamber for a dose of 1 mg of intracameral antibiotic at the completion of the case.    The eye was noted to have a physiologic pressure and there was no wound leak noted.   Timolol and Brimonidine drops were applied to the eye.  The patient was taken to the recovery room in stable condition having had no complications of anesthesia or surgery.  [name] 12/21/2020, 1:07 PM

## 2020-12-21 NOTE — Anesthesia Postprocedure Evaluation (Signed)
Anesthesia Post Note  Patient: [name]  Procedure(s) Performed: CATARACT EXTRACTION PHACO AND INTRAOCULAR LENS PLACEMENT (IOC) LEFT VIVITY TORIC LENS 3.94 00:52.5 (Left: Eye)     Patient location during evaluation: PACU Anesthesia Type: MAC Level of consciousness: awake Pain management: pain level controlled Vital Signs Assessment: post-procedure vital signs reviewed and stable Respiratory status: respiratory function stable Cardiovascular status: stable Postop Assessment: no signs of nausea or vomiting Anesthetic complications: no   No notable events documented.  Veda Canning

## 2020-12-21 NOTE — H&P (Signed)
[place]   Primary Care Physician:  Rusty Aus, MD Ophthalmologist: Dr. Leandrew Koyanagi  Pre-Procedure History & Physical: HPI:  [name] is a 80 y.o. female here for ophthalmic surgery.   Past Medical History:  Diagnosis Date   Allergic rhinitis    Anemia    Anxiety    Arthritis    osteoarthritis   Asthma    Breast cancer ([place]) 1999, 2008   s/p bilat mastectomy   CAD (coronary artery disease)    a. H/o nonobs dzs and vasospasm with takotsubo CM;  b. 05/2013 Inf STEMI/Cath: LM nl, LAD nl, LCX nondom 100d (failed PCI), RCA dominant, nl, RPDA 31m (18mm vessel-->Med Rx), EF 35-40%.   Cervicalgia    Chronic cough    Coronary vasospasm (HCC)    some chest pain "last chest pressure episode 12'15 -evaluated at [facility], multiple testing-ruleout MI, "thought anxiety"   Depression    DVT of popliteal vein (HCC)    left knee- caused bilateral Pulmonary emboli" Coumadin"   Dysrhythmia    NSVT   GERD (gastroesophageal reflux disease)    Hallux valgus    HTN (hypertension)    Hyperlipidemia    Hypothyroid    Ischemic cardiomyopathy    a. 05/2013 Echo: EF 40-45%, midinferior AK.   Myocardial infarction ([place]) 05/2013   NSVT (nonsustained ventricular tachycardia)    a. 05/2013 after MI->amio caused nausea->BB only.   Pulmonary emboli (HCC)    history of dx. 07-05-13 [facility]   Pulmonary nodules    no change CT chest 3/10   Takotsubo cardiomyopathy    a. 2013 with subsequent recovery of LV fxn (followed by MI in 05/2013 with LV dysfxn).    Past Surgical History:  Procedure Laterality Date   bilateral mastectomy  1999, 2008   BREAST SURGERY     bilateral mastectomy   CARDIAC CATHETERIZATION  04/2011   [facility] in [place] N/A 07/17/2017   Procedure: COLONOSCOPY WITH PROPOFOL;  Surgeon: Manya Silvas, MD;  Location: [facility] ENDOSCOPY;  Service: Endoscopy;  Laterality: N/A;   JOINT REPLACEMENT Bilateral    knee   KNEE ARTHROSCOPY Right     LEFT HEART CATHETERIZATION WITH CORONARY ANGIOGRAM Bilateral 05/24/2013   Procedure: LEFT HEART CATHETERIZATION WITH CORONARY ANGIOGRAM;  Surgeon: Lorretta Harp, MD;  Location[facility] CATH LAB;  Service: Cardiovascular;  Laterality: Bilateral;   MASTECTOMY Bilateral    NASAL SINUS SURGERY  1972   PARTIAL KNEE ARTHROPLASTY Left 05/31/2014   Procedure: UNICOMPARTMENTAL LEFT KNEE;  Surgeon: Gaynelle Arabian, MD;  Location: [facility] ORS;  Service: Orthopedics;  Laterality: Left;   PARTIAL KNEE ARTHROPLASTY Right 12/13/2014   Procedure: RIGHT KNEE UNICOMPARTMENTAL ARTHROPLASTY;  Surgeon: Gaynelle Arabian, MD;  Location: [facility] ORS;  Service: Orthopedics;  Laterality: Right;   [place] [facility] [place]      Prior to Admission medications   Medication Sig Start Date End Date Taking? Authorizing Provider  aspirin 81 MG EC tablet Take 81 mg by mouth at bedtime.    Yes [provider]  Calcium Citrate (CITRACAL PO) Take 1 tablet by mouth 3 (three) times daily.   Yes [provider]  carboxymethylcellulose (REFRESH PLUS) 0.5 % SOLN Place 2 drops into both eyes 2 (two) times daily as needed (dry eyes.).   Yes [provider]  carvedilol (COREG) 3.125 MG tablet Take 3.125 mg by mouth 2 (two) times daily with a meal.  Yes [provider]  celecoxib (CELEBREX) 100 MG capsule Take 100 mg by mouth 2 (two) times daily.   Yes [provider]  chlorpheniramine-HYDROcodone (TUSSIONEX) 10-8 MG/5ML SUER Take 5 mLs by mouth every 12 (twelve) hours as needed for cough.   Yes [provider]  cholecalciferol (VITAMIN D) 1000 UNITS tablet Take 1,000 Units by mouth daily.   Yes [provider]  diazepam (VALIUM) 5 MG tablet Take 5 mg by mouth every 12 (twelve) hours as needed for anxiety.   Yes [provider]  diclofenac sodium (VOLTAREN) 1 % GEL Apply 2 g topically 4 (four) times daily.   Yes [provider]  docusate  sodium (COLACE) 100 MG capsule Take 200 mg by mouth every evening.    Yes [provider]  esomeprazole (NEXIUM) 40 MG capsule Take 40 mg by mouth daily as needed (GERD).    Yes [provider]  famotidine (PEPCID) 10 MG tablet Take 10 mg by mouth 2 (two) times daily.   Yes [provider]  Flaxseed, Linseed, (FLAX SEEDS) POWD Take 1 scoop by mouth daily.   Yes [provider]  levothyroxine (SYNTHROID, LEVOTHROID) 75 MCG tablet Take 1 tablet (75 mcg total) by mouth daily before breakfast. 05/29/13  Yes Barrett, Rhonda G, PA-C  magnesium oxide (MAG-OX) 400 MG tablet Take 400 mg by mouth daily after lunch.    Yes [provider]  meclizine (ANTIVERT) 25 MG tablet Take 25 mg by mouth 2 (two) times daily as needed for dizziness.   Yes [provider]  mometasone (NASONEX) 50 MCG/ACT nasal spray Place 2 sprays into the nose at bedtime.    Yes [provider]  montelukast (SINGULAIR) 10 MG tablet Take 10 mg by mouth at bedtime.   Yes [provider]  nitroGLYCERIN (NITROSTAT) 0.4 MG SL tablet Place 1 tablet (0.4 mg total) under the tongue every 5 (five) minutes as needed for chest pain. May repeat three times 05/29/13  Yes Barrett, Evelene Croon, PA-C  pantoprazole (PROTONIX) 20 MG tablet Take 40 mg by mouth 2 (two) times daily.   Yes [provider]  pravastatin (PRAVACHOL) 40 MG tablet Take 40 mg by mouth daily.   Yes [provider]  predniSONE (DELTASONE) 10 MG tablet Take 10 mg by mouth daily with breakfast.   Yes [provider]  Promethazine-DM 6.25-15 MG/5ML SOLN Take by mouth as needed.   Yes [provider]  sodium chloride (OCEAN) 0.65 % SOLN nasal spray Place 2 sprays into both nostrils as needed for congestion.   Yes [provider]  spironolactone (ALDACTONE) 25 MG tablet Take 25 mg by mouth every morning.   Yes [provider]  vitamin B-12 (CYANOCOBALAMIN) 1000 MCG tablet  Take 1,000 mcg by mouth daily.   Yes [provider]  warfarin (COUMADIN) 1 MG tablet Take 1 tablet (1 mg total) by mouth daily. Take Coumadin for three weeks for postoperative protocol and then the patient may resume their previous Coumadin home regimen.  The dose may need to be adjusted based upon the INR.  Please follow the INR and titrate Coumadin dose for a therapeutic range between 2.0 and 3.0 INR.  After completing the three weeks of Coumadin, the patient may resume their previous Coumadin home regimen. Patient taking differently: Take 1 mg by mouth daily. Take Coumadin for three weeks for postoperative protocol and then the patient may resume their previous Coumadin home regimen.  The dose may need  to be adjusted based upon the INR.  Please follow the INR and titrate Coumadin dose for a therapeutic range between 2.0 and 3.0 INR.  After completing the three weeks of Coumadin, the patient may resume their previous Coumadin home regimen. 12/14/14  Yes Perkins, Alexzandrew L, PA-C  warfarin (COUMADIN) 5 MG tablet Take 1 tablet (5 mg total) by mouth daily. Take Coumadin for three weeks for postoperative protocol and then the patient may resume their previous Coumadin home regimen.  The dose may need to be adjusted based upon the INR.  Please follow the INR and titrate Coumadin dose for a therapeutic range between 2.0 and 3.0 INR.  After completing the three weeks of Coumadin, the patient may resume their previous Coumadin home regimen. 12/14/14  Yes Perkins, Alexzandrew L, PA-C  White Petrolatum-Mineral Oil (ARTIFICIAL TEARS) ointment as needed.   Yes [provider]  acetaminophen (TYLENOL) 500 MG tablet Take 500 mg by mouth as needed for moderate pain. Takes 1-2 tablets as needed for headaches    [provider]  carvedilol (COREG) 6.25 MG tablet Take 1 tablet (6.25 mg total) by mouth 2 (two) times daily with a meal. Patient taking differently: Take 3.125 mg by mouth 2 (two)  times daily with a meal. 06/08/13   Gollan, Kathlene November, MD  cetirizine (ZYRTEC) 10 MG tablet Take 10 mg by mouth daily. Patient not taking: Reported on 12/06/2020    [provider]  escitalopram (LEXAPRO) 5 MG tablet Take 5 mg by mouth every morning. Patient not taking: Reported on 12/06/2020    [provider]  fexofenadine (ALLEGRA) 180 MG tablet Take 180 mg by mouth daily. Patient not taking: Reported on 12/06/2020    [provider]  furosemide (LASIX) 20 MG tablet Take 1 tablet (20 mg total) by mouth daily. Patient not taking: No sig reported 05/29/13   Barrett, Evelene Croon, PA-C  HYDROmorphone (DILAUDID) 2 MG tablet Take 1-2 tablets (2-4 mg total) by mouth every 4 (four) hours as needed for moderate pain or severe pain. Patient not taking: No sig reported 12/14/14   Perkins, Alexzandrew L, PA-C  Lifitegrast 5 % SOLN Apply 1 drop to eye 2 (two) times daily.     [provider]  metoprolol tartrate (LOPRESSOR) 25 MG tablet Take 12.5 mg by mouth 2 (two) times daily. Patient not taking: Reported on 12/06/2020    [provider]    Allergies as of 11/21/2020 - Review Complete 01/27/2018  Allergen Reaction Noted   Hydrocodone-acetaminophen Nausea Only and Other (See Comments) 03/04/2015   Hydromorphone Nausea Only 08/03/2014   Hydromorphone hcl Nausea Only 12/10/2017   Amiodarone Nausea And Vomiting 05/21/2014   Codeine Nausea And Vomiting    Hydrocodone Nausea And Vomiting    Lipitor [atorvastatin calcium] Other (See Comments) 07/16/2017   Lisinopril Cough 07/16/2017   Morphine Nausea And Vomiting    Prednisone  04/09/2013   Statins Other (See Comments) 03/24/2009   Other Palpitations 12/10/2017   Procaine hcl Palpitations     Family History  Problem Relation Age of Onset   Stroke Mother    Other Mother        rheumatism   Heart attack Father    Melanoma Brother    Heart attack Brother    Heart attack Brother    Leukemia Brother      Social History   Socioeconomic History   Marital status: Married    Spouse name: Not on file   Number of  children: Not on file   Years of education: Not on file   Highest education level: Not on file  Occupational History   Occupation: Retired Education officer, museum  Tobacco Use   Smoking status: Never   Smokeless tobacco: Never  Vaping Use   Vaping Use: Never used  Substance and Sexual Activity   Alcohol use: No   Drug use: No   Sexual activity: Never  Other Topics Concern   Not on file  Social History Narrative   Acupuncturist. Widowed.    Social Determinants of Health   Financial Resource Strain: Not on file  Food Insecurity: Not on file  Transportation Needs: Not on file  Physical Activity: Not on file  Stress: Not on file  Social Connections: Not on file  Intimate Partner Violence: Not on file    Review of Systems: See HPI, otherwise negative ROS  Physical Exam: BP (!) 161/83   Pulse 61   Temp 98.1 F (36.7 C) (Temporal)   Ht 5\' 4"  (1.626 m)   Wt 74.8 kg   SpO2 95%   BMI 28.29 kg/m  General:   Alert,  pleasant and cooperative in NAD Head:  Normocephalic and atraumatic. Lungs:  Clear to auscultation.    Heart:  Regular rate and rhythm.   Impression/Plan: Lynnae Sandhoff is here for ophthalmic surgery.  Risks, benefits, limitations, and alternatives regarding ophthalmic surgery have been reviewed with the patient.  Questions have been answered.  All parties agreeable.   Leandrew Koyanagi, MD  12/21/2020, 11:44 AM

## 2020-12-21 NOTE — Transfer of Care (Signed)
Immediate Anesthesia Transfer of Care Note  Patient: [name]  Procedure(s) Performed: CATARACT EXTRACTION PHACO AND INTRAOCULAR LENS PLACEMENT (IOC) LEFT VIVITY TORIC LENS 3.94 00:52.5 (Left: Eye)  Patient Location: PACU  Anesthesia Type: MAC  Level of Consciousness: awake, alert  and patient cooperative  Airway and Oxygen Therapy: Patient Spontanous Breathing and Patient connected to supplemental oxygen  Post-op Assessment: Post-op Vital signs reviewed, Patient's Cardiovascular Status Stable, Respiratory Function Stable, Patent Airway and No signs of Nausea or vomiting  Post-op Vital Signs: Reviewed and stable  Complications: No notable events documented.

## 2020-12-22 ENCOUNTER — Encounter: Payer: Self-pay | Admitting: Anesthesiology

## 2020-12-22 ENCOUNTER — Encounter: Payer: Self-pay | Admitting: Ophthalmology

## 2020-12-23 ENCOUNTER — Encounter: Payer: Self-pay | Admitting: Ophthalmology

## 2021-01-04 ENCOUNTER — Ambulatory Visit: Admission: RE | Admit: 2021-01-04 | Payer: Medicare Other | Source: Home / Self Care | Admitting: Ophthalmology

## 2021-01-04 SURGERY — PHACOEMULSIFICATION, CATARACT, WITH IOL INSERTION
Anesthesia: Topical | Laterality: Right

## 2021-01-18 ENCOUNTER — Encounter: Payer: Self-pay | Admitting: Ophthalmology

## 2021-01-23 NOTE — Discharge Instructions (Signed)

## 2021-01-25 ENCOUNTER — Encounter: Payer: Self-pay | Admitting: Ophthalmology

## 2021-01-25 ENCOUNTER — Ambulatory Visit
Admission: RE | Admit: 2021-01-25 | Discharge: 2021-01-25 | Disposition: A | Discharge status: Home / Self Care | Payer: Medicare Other | Attending: Ophthalmology | Admitting: Ophthalmology

## 2021-01-25 ENCOUNTER — Ambulatory Visit: Payer: Medicare Other | Admitting: Anesthesiology

## 2021-01-25 ENCOUNTER — Encounter
Admission: RE | Disposition: A | Discharge status: Home / Self Care | Payer: Self-pay | Source: Home / Self Care | Attending: Ophthalmology

## 2021-01-25 ENCOUNTER — Other Ambulatory Visit: Payer: Self-pay

## 2021-01-25 DIAGNOSIS — F32A Depression, unspecified: Secondary | ICD-10-CM | POA: Insufficient documentation

## 2021-01-25 DIAGNOSIS — K219 Gastro-esophageal reflux disease without esophagitis: Secondary | ICD-10-CM | POA: Insufficient documentation

## 2021-01-25 DIAGNOSIS — H2511 Age-related nuclear cataract, right eye: Secondary | ICD-10-CM | POA: Insufficient documentation

## 2021-01-25 DIAGNOSIS — I252 Old myocardial infarction: Secondary | ICD-10-CM | POA: Diagnosis not present

## 2021-01-25 DIAGNOSIS — R0602 Shortness of breath: Secondary | ICD-10-CM | POA: Diagnosis not present

## 2021-01-25 DIAGNOSIS — Z7901 Long term (current) use of anticoagulants: Secondary | ICD-10-CM | POA: Diagnosis not present

## 2021-01-25 DIAGNOSIS — I2510 Atherosclerotic heart disease of native coronary artery without angina pectoris: Secondary | ICD-10-CM | POA: Diagnosis not present

## 2021-01-25 DIAGNOSIS — J45909 Unspecified asthma, uncomplicated: Secondary | ICD-10-CM | POA: Insufficient documentation

## 2021-01-25 DIAGNOSIS — I499 Cardiac arrhythmia, unspecified: Secondary | ICD-10-CM | POA: Diagnosis not present

## 2021-01-25 DIAGNOSIS — F419 Anxiety disorder, unspecified: Secondary | ICD-10-CM | POA: Insufficient documentation

## 2021-01-25 DIAGNOSIS — E785 Hyperlipidemia, unspecified: Secondary | ICD-10-CM | POA: Diagnosis not present

## 2021-01-25 DIAGNOSIS — Z86718 Personal history of other venous thrombosis and embolism: Secondary | ICD-10-CM | POA: Insufficient documentation

## 2021-01-25 DIAGNOSIS — I110 Hypertensive heart disease with heart failure: Secondary | ICD-10-CM | POA: Insufficient documentation

## 2021-01-25 DIAGNOSIS — E039 Hypothyroidism, unspecified: Secondary | ICD-10-CM | POA: Insufficient documentation

## 2021-01-25 DIAGNOSIS — F17200 Nicotine dependence, unspecified, uncomplicated: Secondary | ICD-10-CM | POA: Insufficient documentation

## 2021-01-25 DIAGNOSIS — I509 Heart failure, unspecified: Secondary | ICD-10-CM | POA: Diagnosis not present

## 2021-01-25 DIAGNOSIS — Z853 Personal history of malignant neoplasm of breast: Secondary | ICD-10-CM | POA: Insufficient documentation

## 2021-01-25 DIAGNOSIS — M1990 Unspecified osteoarthritis, unspecified site: Secondary | ICD-10-CM | POA: Insufficient documentation

## 2021-01-25 HISTORY — PX: CATARACT EXTRACTION W/PHACO: SHX586

## 2021-01-25 SURGERY — PHACOEMULSIFICATION, CATARACT, WITH IOL INSERTION
Anesthesia: Monitor Anesthesia Care | Site: Eye | Laterality: Right

## 2021-01-25 MED ORDER — FENTANYL CITRATE (PF) 100 MCG/2ML IJ SOLN
INTRAMUSCULAR | Status: DC | PRN
  Administered 2021-01-25: 50 ug via INTRAVENOUS

## 2021-01-25 MED ORDER — BRIMONIDINE TARTRATE-TIMOLOL 0.2-0.5 % OP SOLN
OPHTHALMIC | Status: DC | PRN
  Administered 2021-01-25: 1 [drp] via OPHTHALMIC

## 2021-01-25 MED ORDER — MIDAZOLAM HCL 2 MG/2ML IJ SOLN
INTRAMUSCULAR | Status: DC | PRN
Start: 2021-01-25 — End: 2021-01-25
  Administered 2021-01-25: 2 mg via INTRAVENOUS

## 2021-01-25 MED ORDER — SIGHTPATH DOSE#1 NA HYALUR & NA CHOND-NA HYALUR IO KIT
PACK | INTRAOCULAR | Status: DC | PRN
  Administered 2021-01-25: 1 via OPHTHALMIC

## 2021-01-25 MED ORDER — SIGHTPATH DOSE#1 BSS IO SOLN
INTRAOCULAR | Status: DC | PRN
  Administered 2021-01-25: 15 mL

## 2021-01-25 MED ORDER — ARMC OPHTHALMIC DILATING DROPS
1.0000 "application " | OPHTHALMIC | Status: DC | PRN
  Administered 2021-01-25 (×3): 1 via OPHTHALMIC

## 2021-01-25 MED ORDER — CEFUROXIME OPHTHALMIC INJECTION 1 MG/0.1 ML
INJECTION | OPHTHALMIC | Status: DC | PRN
  Administered 2021-01-25: 0.1 mL via INTRACAMERAL

## 2021-01-25 MED ORDER — TETRACAINE HCL 0.5 % OP SOLN
1.0000 [drp] | OPHTHALMIC | Status: DC | PRN
  Administered 2021-01-25 (×3): 1 [drp] via OPHTHALMIC

## 2021-01-25 MED ORDER — LACTATED RINGERS IV SOLN: INTRAVENOUS | Status: DC

## 2021-01-25 MED ORDER — SIGHTPATH DOSE#1 BSS IO SOLN
INTRAOCULAR | Status: DC | PRN
  Administered 2021-01-25: 1 mL via INTRAMUSCULAR

## 2021-01-25 MED ORDER — SIGHTPATH DOSE#1 BSS IO SOLN
INTRAOCULAR | Status: DC | PRN
  Administered 2021-01-25: 88 mL via OPHTHALMIC

## 2021-01-25 MED ORDER — ONDANSETRON HCL 4 MG/2ML IJ SOLN: 4.0000 mg | Freq: Once | INTRAMUSCULAR | Status: DC | PRN

## 2021-01-25 SURGICAL SUPPLY — 9 items
GLOVE SURG ENC TEXT LTX SZ7.5 (GLOVE) ×2 IMPLANT
LENS IOL ACRSF VT TRC 415 20.5 IMPLANT
LENS IOL ACRYSOF VIVITY 20.5 ×2 IMPLANT
LENS IOL VIVITY 415 20.5 ×1 IMPLANT
NDL FILTER BLUNT 18X1 1/2 (NEEDLE) ×2 IMPLANT
NEEDLE FILTER BLUNT 18X 1/2SAF (NEEDLE) ×2
NEEDLE FILTER BLUNT 18X1 1/2 (NEEDLE) ×2 IMPLANT
PACK EYE AFTER SURG (MISCELLANEOUS) ×2 IMPLANT
WATER STERILE IRR 250ML POUR (IV SOLUTION) ×2 IMPLANT

## 2021-01-25 NOTE — Transfer of Care (Signed)
Immediate Anesthesia Transfer of Care Note  Patient: [name]  Procedure(s) Performed: CATARACT EXTRACTION PHACO AND INTRAOCULAR LENS PLACEMENT (IOC) RIGHT VIVITY TORIC 4.83 01:16.4 (Right: Eye)  Patient Location: PACU  Anesthesia Type: MAC  Level of Consciousness: awake, alert  and patient cooperative  Airway and Oxygen Therapy: Patient Spontanous Breathing and Patient connected to supplemental oxygen  Post-op Assessment: Post-op Vital signs reviewed, Patient's Cardiovascular Status Stable, Respiratory Function Stable, Patent Airway and No signs of Nausea or vomiting  Post-op Vital Signs: Reviewed and stable  Complications: No notable events documented.

## 2021-01-25 NOTE — Anesthesia Preprocedure Evaluation (Signed)
Anesthesia Evaluation  Patient identified by MRN, date of birth, ID band Patient awake    Reviewed: Allergy & Precautions, NPO status , reviewed documented beta blocker date and time   History of Anesthesia Complications Negative for: history of anesthetic complications  Airway Mallampati: III  TM Distance: <3 FB Neck ROM: Limited    Dental   Pulmonary shortness of breath, asthma , Current SmokerPatient did not abstain from smoking.,    Pulmonary exam normal breath sounds clear to auscultation       Cardiovascular hypertension, Pt. on medications and Pt. on home beta blockers + CAD (vasospasm), + Past MI, +CHF (TTE 2015 - EF 40-45%) and + DVT (DVT/PT in 2015, on Eliquis)  + dysrhythmias (NSVT in 2015 after amnio)  Rhythm:Regular Rate:Normal  HLD   Neuro/Psych PSYCHIATRIC DISORDERS Anxiety Depression    GI/Hepatic GERD  ,  Endo/Other  Hypothyroidism   Renal/GU      Musculoskeletal  (+) Arthritis ,   Abdominal   Peds  Hematology  (+) anemia ,   Anesthesia Other Findings Hx breast cancer  Reproductive/Obstetrics                             Anesthesia Physical  Anesthesia Plan  ASA: 3  Anesthesia Plan: MAC   Post-op Pain Management:    Induction: Intravenous  PONV Risk Score and Plan: 2 and TIVA, Midazolam and Treatment may vary due to age or medical condition  Airway Management Planned: Natural Airway and Nasal Cannula  Additional Equipment:   Intra-op Plan:   Post-operative Plan:   Informed Consent: I have reviewed the patients History and Physical, chart, labs and discussed the procedure including the risks, benefits and alternatives for the proposed anesthesia with the patient or authorized representative who has indicated his/her understanding and acceptance.       Plan Discussed with: CRNA  Anesthesia Plan Comments:         Anesthesia Quick Evaluation

## 2021-01-25 NOTE — Anesthesia Postprocedure Evaluation (Signed)
Anesthesia Post Note  Patient: [name]  Procedure(s) Performed: CATARACT EXTRACTION PHACO AND INTRAOCULAR LENS PLACEMENT (IOC) RIGHT VIVITY TORIC 4.83 01:16.4 (Right: Eye)     Patient location during evaluation: PACU Anesthesia Type: MAC Level of consciousness: awake and alert Pain management: pain level controlled Vital Signs Assessment: post-procedure vital signs reviewed and stable Respiratory status: spontaneous breathing, nonlabored ventilation, respiratory function stable and patient connected to nasal cannula oxygen Cardiovascular status: stable and blood pressure returned to baseline Postop Assessment: no apparent nausea or vomiting Anesthetic complications: no   No notable events documented.  Andrae Claunch A  Jefrey Raburn

## 2021-01-25 NOTE — H&P (Signed)
[place]   Primary Care Physician:  Rusty Aus, MD Ophthalmologist: Dr. Leandrew Koyanagi  Pre-Procedure History & Physical: HPI:  [name] is a 80 y.o. female here for ophthalmic surgery.   Past Medical History:  Diagnosis Date   Allergic rhinitis    Anemia    Anxiety    Arthritis    osteoarthritis   Asthma    Breast cancer ([place]) 1999, 2008   s/p bilat mastectomy   CAD (coronary artery disease)    a. H/o nonobs dzs and vasospasm with takotsubo CM;  b. 05/2013 Inf STEMI/Cath: LM nl, LAD nl, LCX nondom 100d (failed PCI), RCA dominant, nl, RPDA 33m (90mm vessel-->Med Rx), EF 35-40%.   Cervicalgia    Chronic cough    Coronary vasospasm (HCC)    some chest pain "last chest pressure episode 12'15 -evaluated at [facility], multiple testing-ruleout MI, "thought anxiety"   Depression    DVT of popliteal vein (HCC)    left knee- caused bilateral Pulmonary emboli" Coumadin"   Dysrhythmia    NSVT   GERD (gastroesophageal reflux disease)    Hallux valgus    HTN (hypertension)    Hyperlipidemia    Hypothyroid    Ischemic cardiomyopathy    a. 05/2013 Echo: EF 40-45%, midinferior AK.   Myocardial infarction ([place]) 05/2013   NSVT (nonsustained ventricular tachycardia)    a. 05/2013 after MI->amio caused nausea->BB only.   Pulmonary emboli (HCC)    history of dx. 07-05-13 [facility]   Pulmonary nodules    no change CT chest 3/10   Takotsubo cardiomyopathy    a. 2013 with subsequent recovery of LV fxn (followed by MI in 05/2013 with LV dysfxn).    Past Surgical History:  Procedure Laterality Date   bilateral mastectomy  1999, 2008   BREAST SURGERY     bilateral mastectomy   CARDIAC CATHETERIZATION  04/2011   [facility] in [place] Left 12/21/2020   Procedure: CATARACT EXTRACTION PHACO AND INTRAOCULAR LENS PLACEMENT ([place]) LEFT VIVITY TORIC LENS 3.94 00:52.5;  Surgeon: Leandrew Koyanagi, MD;  Location: [place];  Service:  Ophthalmology;  Laterality: Left;   COLONOSCOPY WITH PROPOFOL N/A 07/17/2017   Procedure: COLONOSCOPY WITH PROPOFOL;  Surgeon: Manya Silvas, MD;  Location: [facility] ENDOSCOPY;  Service: Endoscopy;  Laterality: N/A;   JOINT REPLACEMENT Bilateral    knee   KNEE ARTHROSCOPY Right    LEFT HEART CATHETERIZATION WITH CORONARY ANGIOGRAM Bilateral 05/24/2013   Procedure: LEFT HEART CATHETERIZATION WITH CORONARY ANGIOGRAM;  Surgeon: Lorretta Harp, MD;  Location: [facility] CATH LAB;  Service: Cardiovascular;  Laterality: Bilateral;   MASTECTOMY Bilateral    NASAL SINUS SURGERY  1972   PARTIAL KNEE ARTHROPLASTY Left 05/31/2014   Procedure: UNICOMPARTMENTAL LEFT KNEE;  Surgeon: Gaynelle Arabian, MD;  Location: [facility] ORS;  Service: Orthopedics;  Laterality: Left;   PARTIAL KNEE ARTHROPLASTY Right 12/13/2014   Procedure: RIGHT KNEE UNICOMPARTMENTAL ARTHROPLASTY;  Surgeon: Gaynelle Arabian, MD;  Location: [facility] ORS;  Service: Orthopedics;  Laterality: Right;   [place]      Prior to Admission medications   Medication Sig Start Date End Date Taking? Authorizing Provider  acetaminophen (TYLENOL) 500 MG tablet Take 500 mg by mouth as needed for moderate pain. Takes 1-2 tablets as needed for headaches   Yes [provider]  apixaban (ELIQUIS) 5 MG TABS tablet Take 5 mg by mouth 2 (two) times daily.  Yes [provider]  aspirin 81 MG EC tablet Take 81 mg by mouth at bedtime.    Yes [provider]  Calcium Citrate (CITRACAL PO) Take 1 tablet by mouth 3 (three) times daily.   Yes [provider]  carvedilol (COREG) 3.125 MG tablet Take 3.125 mg by mouth 2 (two) times daily with a meal.   Yes [provider]  carvedilol (COREG) 6.25 MG tablet Take 1 tablet (6.25 mg total) by mouth 2 (two) times daily with a meal. Patient taking differently: Take 3.125 mg by mouth 2 (two) times daily with a meal. 06/08/13  Yes Gollan, Kathlene November, MD   celecoxib (CELEBREX) 100 MG capsule Take 100 mg by mouth daily.   Yes [provider]  chlorpheniramine-HYDROcodone (TUSSIONEX) 10-8 MG/5ML SUER Take 5 mLs by mouth every 12 (twelve) hours as needed for cough.   Yes [provider]  cholecalciferol (VITAMIN D) 1000 UNITS tablet Take 1,000 Units by mouth daily.   Yes [provider]  diazepam (VALIUM) 5 MG tablet Take 5 mg by mouth every 12 (twelve) hours as needed for anxiety.   Yes [provider]  diclofenac sodium (VOLTAREN) 1 % GEL Apply 2 g topically 4 (four) times daily.   Yes [provider]  docusate sodium (COLACE) 100 MG capsule Take 200 mg by mouth every evening.    Yes [provider]  esomeprazole (NEXIUM) 40 MG capsule Take 40 mg by mouth daily as needed (GERD).    Yes [provider]  famotidine (PEPCID) 10 MG tablet Take 10 mg by mouth 2 (two) times daily.   Yes [provider]  Flaxseed, Linseed, (FLAX SEEDS) POWD Take 1 scoop by mouth daily.   Yes [provider]  levothyroxine (SYNTHROID, LEVOTHROID) 75 MCG tablet Take 1 tablet (75 mcg total) by mouth daily before breakfast. 05/29/13  Yes Barrett, Evelene Croon, PA-C  Lifitegrast 5 % SOLN Apply 1 drop to eye 2 (two) times daily.    Yes [provider]  magnesium oxide (MAG-OX) 400 MG tablet Take 400 mg by mouth daily after lunch.    Yes [provider]  meclizine (ANTIVERT) 25 MG tablet Take 25 mg by mouth 2 (two) times daily as needed for dizziness.   Yes [provider]  mometasone (NASONEX) 50 MCG/ACT nasal spray Place 2 sprays into the nose at bedtime.    Yes [provider]  montelukast (SINGULAIR) 10 MG tablet Take 10 mg by mouth at bedtime.   Yes [provider]  pantoprazole (PROTONIX) 20 MG tablet Take 40 mg by mouth 2 (two) times daily.   Yes [provider]  predniSONE (DELTASONE) 10 MG tablet Take 10 mg by mouth daily with breakfast.   Yes  [provider]  Promethazine-DM 6.25-15 MG/5ML SOLN Take by mouth as needed.   Yes [provider]  rosuvastatin (CRESTOR) 20 MG tablet Take 20 mg by mouth daily.   Yes [provider]  sodium chloride (OCEAN) 0.65 % SOLN nasal spray Place 2 sprays into both nostrils as needed for congestion.   Yes [provider]  spironolactone (ALDACTONE) 25 MG tablet Take 25 mg by mouth every morning.   Yes [provider]  vitamin B-12 (CYANOCOBALAMIN) 1000 MCG tablet Take 1,000 mcg by mouth daily.   Yes [provider]  White Petrolatum-Mineral Oil (ARTIFICIAL TEARS) ointment as needed.   Yes [provider]  carboxymethylcellulose (REFRESH PLUS) 0.5 % SOLN Place 2 drops  into both eyes 2 (two) times daily as needed (dry eyes.).    [provider]  cetirizine (ZYRTEC) 10 MG tablet Take 10 mg by mouth daily. Patient not taking: Reported on 12/06/2020    [provider]  escitalopram (LEXAPRO) 5 MG tablet Take 5 mg by mouth every morning. Patient not taking: Reported on 12/06/2020    [provider]  fexofenadine (ALLEGRA) 180 MG tablet Take 180 mg by mouth daily. Patient not taking: Reported on 12/06/2020    [provider]  furosemide (LASIX) 20 MG tablet Take 1 tablet (20 mg total) by mouth daily. Patient not taking: No sig reported 05/29/13   Barrett, Evelene Croon, PA-C  HYDROmorphone (DILAUDID) 2 MG tablet Take 1-2 tablets (2-4 mg total) by mouth every 4 (four) hours as needed for moderate pain or severe pain. Patient not taking: No sig reported 12/14/14   Perkins, Alexzandrew L, PA-C  metoprolol tartrate (LOPRESSOR) 25 MG tablet Take 12.5 mg by mouth 2 (two) times daily. Patient not taking: Reported on 12/06/2020    [provider]  nitroGLYCERIN (NITROSTAT) 0.4 MG SL tablet Place 1 tablet (0.4 mg total) under the tongue every 5 (five) minutes as needed for chest pain. May repeat three times 05/29/13    Barrett, Evelene Croon, PA-C  pravastatin (PRAVACHOL) 40 MG tablet Take 40 mg by mouth daily. Patient not taking: Reported on 01/18/2021    [provider]  warfarin (COUMADIN) 1 MG tablet Take 1 tablet (1 mg total) by mouth daily. Take Coumadin for three weeks for postoperative protocol and then the patient may resume their previous Coumadin home regimen.  The dose may need to be adjusted based upon the INR.  Please follow the INR and titrate Coumadin dose for a therapeutic range between 2.0 and 3.0 INR.  After completing the three weeks of Coumadin, the patient may resume their previous Coumadin home regimen. Patient not taking: Reported on 01/18/2021 12/14/14   Dara Lords, Alexzandrew L, PA-C  warfarin (COUMADIN) 5 MG tablet Take 1 tablet (5 mg total) by mouth daily. Take Coumadin for three weeks for postoperative protocol and then the patient may resume their previous Coumadin home regimen.  The dose may need to be adjusted based upon the INR.  Please follow the INR and titrate Coumadin dose for a therapeutic range between 2.0 and 3.0 INR.  After completing the three weeks of Coumadin, the patient may resume their previous Coumadin home regimen. Patient not taking: Reported on 01/18/2021 12/14/14   Dara Lords, Alexzandrew L, PA-C    Allergies as of 01/10/2021 - Review Complete 12/22/2020  Allergen Reaction Noted   Hydrocodone-acetaminophen Nausea Only and Other (See Comments) 03/04/2015   Hydromorphone Nausea Only 08/03/2014   Hydromorphone hcl Nausea Only 12/10/2017   Amiodarone Nausea And Vomiting 05/21/2014   Codeine Nausea And Vomiting    Hydrocodone Nausea And Vomiting    Lipitor [atorvastatin calcium] Other (See Comments) 07/16/2017   Lisinopril Cough 07/16/2017   Morphine Nausea And Vomiting    Prednisone  04/09/2013   Statins Other (See Comments) 03/24/2009   Other Palpitations 12/10/2017   Procaine hcl Palpitations     Family History  Problem Relation Age of Onset   Stroke  Mother    Other Mother        rheumatism   Heart attack Father    Melanoma Brother    Heart attack Brother    Heart attack Brother    Leukemia Brother     Social History  Socioeconomic History   Marital status: Married    Spouse name: Not on file   Number of children: Not on file   Years of education: Not on file   Highest education level: Not on file  Occupational History   Occupation: Retired Education officer, museum  Tobacco Use   Smoking status: Never   Smokeless tobacco: Never  Vaping Use   Vaping Use: Never used  Substance and Sexual Activity   Alcohol use: No   Drug use: No   Sexual activity: Never  Other Topics Concern   Not on file  Social History Narrative   Acupuncturist. Widowed.    Social Determinants of Health   Financial Resource Strain: Not on file  Food Insecurity: Not on file  Transportation Needs: Not on file  Physical Activity: Not on file  Stress: Not on file  Social Connections: Not on file  Intimate Partner Violence: Not on file    Review of Systems: See HPI, otherwise negative ROS  Physical Exam: BP (!) 144/76   Pulse 67   Temp 97.8 F (36.6 C) (Temporal)   Resp 16   Ht 5\' 4"  (1.626 m)   Wt 73 kg   SpO2 97%   BMI 27.64 kg/m  General:   Alert,  pleasant and cooperative in NAD Head:  Normocephalic and atraumatic. Lungs:  Clear to auscultation.    Heart:  Regular rate and rhythm.   Impression/Plan: [name] is here for ophthalmic surgery.  Risks, benefits, limitations, and alternatives regarding ophthalmic surgery have been reviewed with the patient.  Questions have been answered.  All parties agreeable.   Leandrew Koyanagi, MD  01/25/2021, 8:21 AM

## 2021-01-25 NOTE — Op Note (Signed)
LOCATION:  [place]   PREOPERATIVE DIAGNOSIS:  Nuclear sclerotic cataract of the right eye.  H25.11   POSTOPERATIVE DIAGNOSIS:  Nuclear sclerotic cataract of the right eye.   PROCEDURE:  Phacoemulsification with Toric posterior chamber intraocular lens placement of the right eye.  Ultrasound time: Procedure(s): CATARACT EXTRACTION PHACO AND INTRAOCULAR LENS PLACEMENT ([facility]) RIGHT VIVITY TORIC 4.83 01:16.4 (Right)  LENS:   Implant Name Type Inv. Item Serial No. Manufacturer Lot No. LRB No. Used Action  LENS IOL ACRYSOF VIVITY 20.5 - [id number]  LENS IOL ACRYSOF VIVITY 20.5 [id number] ALCON  Right 1 Implanted     [id number] 20.5D  Toric intraocular lens with 2.25 diopters of cylindrical power with axis orientation at 15 degrees.   SURGEON:  Wyonia Hough, MD   ANESTHESIA: Topical with tetracaine drops and 2% Xylocaine jelly, augmented with 1% preservative-free intracameral lidocaine. .   COMPLICATIONS:  None.   DESCRIPTION OF PROCEDURE:  The patient was identified in the holding room and transported to the operating suite and placed in the supine position under the operating microscope.  The right eye was identified as the operative eye, and it was prepped and draped in the usual sterile ophthalmic fashion.    A clear-corneal paracentesis incision was made at the 12:00 position.  0.5 ml of preservative-free 1% lidocaine was injected into the anterior chamber. The anterior chamber was filled with Viscoat.  A 2.4 millimeter near clear corneal incision was then made at the 9:00 position.  A cystotome and capsulorrhexis forceps were then used to make a curvilinear capsulorrhexis.  Hydrodissection and hydrodelineation were then performed using balanced salt solution.   Phacoemulsification was then used in stop and chop fashion to remove the lens, nucleus and epinucleus.  The remaining cortex was aspirated using the irrigation and aspiration handpiece.  Provisc viscoelastic was  then placed into the capsular bag to distend it for lens placement.  The Verion digital marker was used to align the implant at the intended axis.   A Toric lens was then injected into the capsular bag.  It was rotated clockwise until the axis marks on the lens were approximately 15 degrees in the counterclockwise direction to the intended alignment.  The viscoelastic was aspirated from the eye using the irrigation aspiration handpiece.  Then, a Koch spatula through the sideport incision was used to rotate the lens in a clockwise direction until the axis markings of the intraocular lens were lined up with the Verion alignment.  Balanced salt solution was then used to hydrate the wounds. Cefuroxime 0.1 ml of a 10mg /ml solution was injected into the anterior chamber for a dose of 1 mg of intracameral antibiotic at the completion of the case.    The eye was noted to have a physiologic pressure and there was no wound leak noted.   Timolol and Brimonidine drops were applied to the eye.  The patient was taken to the recovery room in stable condition having had no complications of anesthesia or surgery.  [name] 01/25/2021, 9:22 AM

## 2021-01-26 ENCOUNTER — Encounter: Payer: Self-pay | Admitting: Ophthalmology

## 2021-05-26 ENCOUNTER — Encounter: Payer: Self-pay | Admitting: Emergency Medicine

## 2021-05-26 ENCOUNTER — Emergency Department: Payer: Medicare Other

## 2021-05-26 ENCOUNTER — Other Ambulatory Visit: Payer: Self-pay

## 2021-05-26 ENCOUNTER — Emergency Department
Admission: EM | Admit: 2021-05-26 | Discharge: 2021-05-26 | Disposition: A | Discharge status: Home / Self Care | Payer: Medicare Other | Attending: Emergency Medicine | Admitting: Emergency Medicine

## 2021-05-26 DIAGNOSIS — I10 Essential (primary) hypertension: Secondary | ICD-10-CM | POA: Diagnosis not present

## 2021-05-26 DIAGNOSIS — J22 Unspecified acute lower respiratory infection: Secondary | ICD-10-CM | POA: Insufficient documentation

## 2021-05-26 DIAGNOSIS — Z20822 Contact with and (suspected) exposure to covid-19: Secondary | ICD-10-CM | POA: Insufficient documentation

## 2021-05-26 DIAGNOSIS — J45909 Unspecified asthma, uncomplicated: Secondary | ICD-10-CM | POA: Insufficient documentation

## 2021-05-26 DIAGNOSIS — R509 Fever, unspecified: Secondary | ICD-10-CM | POA: Diagnosis present

## 2021-05-26 LAB — RESP PANEL BY RT-PCR (FLU A&B, COVID) ARPGX2
Influenza A by PCR: NEGATIVE
Influenza B by PCR: NEGATIVE
SARS Coronavirus 2 by RT PCR: NEGATIVE

## 2021-05-26 MED ORDER — ALBUTEROL SULFATE HFA 108 (90 BASE) MCG/ACT IN AERS
2.0000 | INHALATION_SPRAY | Freq: Four times a day (QID) | RESPIRATORY_TRACT | 2 refills | Status: AC | PRN
Start: 2021-05-26 — End: ?

## 2021-05-26 MED ORDER — PREDNISONE 10 MG PO TABS: 10.0000 mg | ORAL_TABLET | Freq: Every day | ORAL | 0 refills | Status: AC

## 2021-05-26 MED ORDER — AMOXICILLIN 500 MG PO CAPS: 1000.0000 mg | ORAL_CAPSULE | Freq: Three times a day (TID) | ORAL | 0 refills | Status: AC

## 2021-05-26 NOTE — Discharge Instructions (Addendum)
-  Take all of your medications as prescribed. ?-Follow-up with your primary care provider next week if your symptoms persist. ?-You may continue utilizing over-the-counter medications as needed. ?-Limit your exposure outside with the pollen.  ?-Return to the emergency department anytime if you begin to experience any new or worsening symptoms. ?

## 2021-05-26 NOTE — ED Triage Notes (Signed)
C/O fever x 1 day/  tmax 100.4.  C/O cough an dgeneral body aches. ?

## 2021-05-26 NOTE — ED Provider Notes (Signed)
? ?[facility] ?Provider Note ? ? ? Event Date/Time  ? First MD Initiated Contact with Patient 05/26/21 1546   ?  (approximate) ? ? ?History  ? ?Chief Complaint ?Fever ? ? ?HPI ?[name] is a 81 y.o. female, history of hypertension, MI, hyperlipidemia, STEMI, DVT, asthma, Takotsubo cardiomyopathy, presents to the emergency department for evaluation of fever and cough.  Patient states that she has been experiencing persistent coughing for the past 3 days.  She attributes this to spending a lot of time outdoors and being exposed to significant pollen.  However, last night she additionally found that she had a fever of 100.4.  Reports some chest discomfort while coughing, otherwise no pain.  Denies chest pain, shortness of breath, abdominal pain, flank pain, nausea/vomiting, diarrhea, urinary symptoms, rash/lesions, dizziness/lightheadedness, or headache. ? ?History Limitations: No limitations. ? ?    ? ? ?Physical Exam  ?Triage Vital Signs: ?ED Triage Vitals  ?Enc Vitals Group  ?   BP 05/26/21 1532 97/61  ?   Pulse Rate 05/26/21 1532 76  ?   Resp 05/26/21 1532 16  ?   Temp 05/26/21 1532 99.4 ?F (37.4 ?C)  ?   Temp Source 05/26/21 1532 Oral  ?   SpO2 05/26/21 1532 98 %  ?   Weight 05/26/21 1528 160 lb 15 oz (73 kg)  ?   Height 05/26/21 1528 '5\' 4"'$  (1.626 m)  ?   Head Circumference --   ?   Peak Flow --   ?   Pain Score 05/26/21 1527 6  ?   Pain Loc --   ?   Pain Edu? --   ?   Excl. in [place]? --   ? ? ?Most recent vital signs: ?Vitals:  ? 05/26/21 1532  ?BP: 97/61  ?Pulse: 76  ?Resp: 16  ?Temp: 99.4 ?F (37.4 ?C)  ?SpO2: 98%  ? ? ?General: Awake, NAD.  Audible, productive cough. ?Skin: Warm, dry. No rashes or lesions.  ?Eyes: PERRL. Conjunctivae normal.  ?ENT: Throat clear, no erythema or exudates. Uvula midline.  ?Neck: Normal ROM. No nuchal rigidity.  ?CV: Good peripheral perfusion.  ?Resp: Normal effort.  Mild rhonchi and wheezing in the lungs bilaterally. ?Abd: Soft, non-tender. No distention.   ?Neuro: At baseline. No gross neurological deficits.  ?MSK: No gross deformities. Normal ROM in all extremities.  ? ?Physical Exam ? ? ? ?ED Results / Procedures / Treatments  ?Labs ?(all labs ordered are listed, but only abnormal results are displayed) ?Labs Reviewed  ?RESP PANEL BY RT-PCR (FLU A&B, COVID) [id number]  ? ? ? ?EKG ?Not applicable ? ?RADIOLOGY ? ?ED Provider Interpretation: I personally reviewed and interpreted this chest x-ray.  No evidence of cardiopulmonary disease. ? ?DG Chest 2 View ? ?Result Date: 05/26/2021 ?CLINICAL DATA:  Cough, body aches, and fever. EXAM: CHEST - 2 VIEW COMPARISON:  Chest x-ray report dated December 14, 2020. Chest x-ray and chest CT dated Jul 05, 2013. FINDINGS: The heart size and mediastinal contours are within normal limits. Both lungs are clear. The visualized skeletal structures are unremarkable. IMPRESSION: No active cardiopulmonary disease. Electronically Signed   By: Titus Dubin M.D.   On: 05/26/2021 16:25   ? ?PROCEDURES: ? ?Critical Care performed: None. ? ?Procedures ? ? ? ?MEDICATIONS ORDERED IN ED: ?Medications - No data to display ? ? ?IMPRESSION / MDM / ASSESSMENT AND PLAN / ED COURSE  ?I reviewed the triage vital signs and the nursing notes. ?             ?               ? ?  Differential diagnosis includes, but is not limited to, allergic rhinitis, COVID-19, influenza, pneumonia, asthma exacerbation, viral URI. ? ?ED Course ?Patient appears well.  Vital signs within normal limits.  NAD.  Currently afebrile. ? ?Respiratory panel negative for COVID-19 or influenza. ? ?Chest x-ray shows no evidence of acute cardiopulmonary abnormalities. ? ?Assessment/Plan ?Patient presents with fever and a cough.  Physical exam shows some wheezing and rhonchi in the lower lobes, though chest x-ray shows no evidence of pneumonia.  Given the patient's age and history of asthma, we will go ahead treat with amoxicillin,prednisone, and albuterol inhaler.  Patient states she is unable  to tolerate anything beyond 10 mg of prednisone because it keeps her awake.  We will plan to discharge this patient.  Advised her to follow-up with her primary care provider next week if her symptoms continue to persist ? ?Considered admission for this patient, but given her stable presentation and negative imaging, she is unlikely to benefit. ? ?Patient was provided with anticipatory guidance, return precautions, and educational material. Encouraged the patient to return to the emergency department at any time if they begin to experience any new or worsening symptoms. Patient expressed understanding and agreed with the plan. ? ?  ? ? ?FINAL CLINICAL IMPRESSION(S) / ED DIAGNOSES  ? ?Final diagnoses:  ?Lower respiratory infection  ? ? ? ?Rx / DC Orders  ? ?ED Discharge Orders   ? ?      Ordered  ?  predniSONE (DELTASONE) 10 MG tablet  Daily with breakfast       ? 05/26/21 1727  ?  amoxicillin (AMOXIL) 500 MG capsule  3 times daily       ? 05/26/21 1727  ?  albuterol (VENTOLIN HFA) 108 (90 Base) MCG/ACT inhaler  Every 6 hours PRN       ? 05/26/21 1727  ? ?  ?  ? ?  ? ? ? ?Note:  This document was prepared using Dragon voice recognition software and may include unintentional dictation errors. ?  ?Teodoro Spray, [place] ?05/26/21 1732 ? ?  ?Arta Silence, MD ?05/26/21 2300 ? ?

## 2022-03-19 IMAGING — MR MR CERVICAL SPINE W/O CM
5 series · 39 of 48 positions shown · non-contrast
Comparison: 09/11/2019 cervical spine radiographs report.
01/28/2003 MRI cervical spine report.

CLINICAL DATA: Severe neck pain.  Left arm and hand numbness.

EXAM:
MRI CERVICAL SPINE WITHOUT CONTRAST
TECHNIQUE: Multiplanar, multisequence MR imaging of the cervical spine was
performed. No intravenous contrast was administered.

[Series 9: T2 · sagittal · 3.0mm · 0.62mm/px · 6 of 15 slices shown (1 of 2)]
[im 1/15]
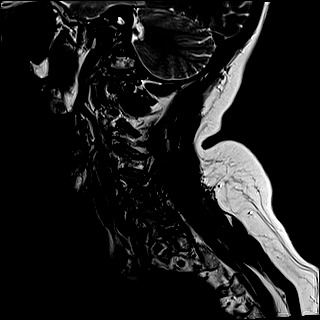
[im 3/15]
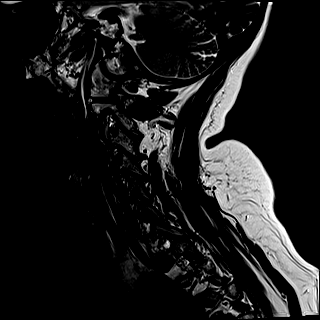
[im 6/15]
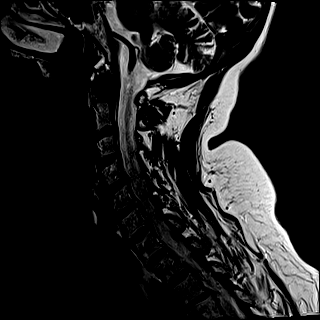
[im 9/15]
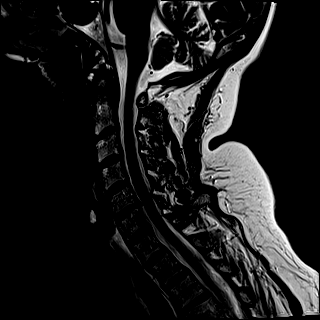
[im 12/15]
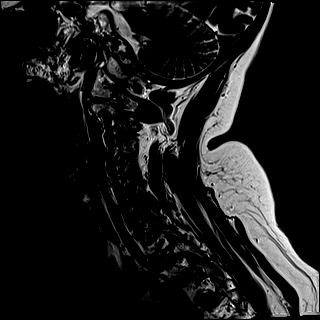
[im 15/15]
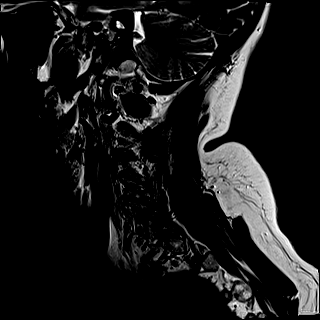

[Series 10: FLAIR · sagittal · 3.0mm · 0.78mm/px · 7 of 15 slices shown]
[im 1/15]
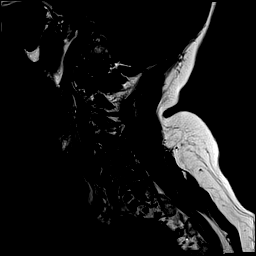
[im 3/15]
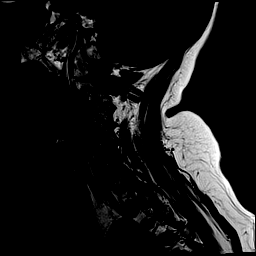
[im 5/15]
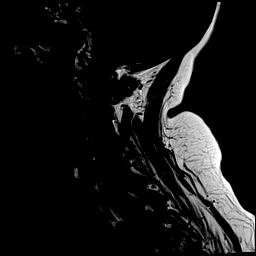
[im 8/15]
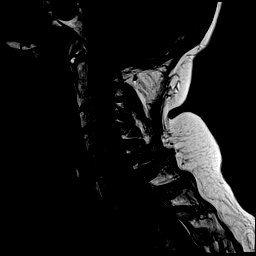
[im 10/15]
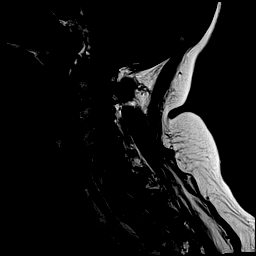
[im 12/15]
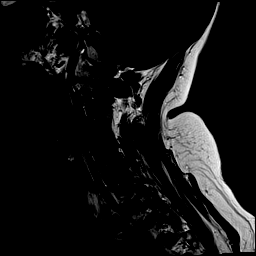
[im 15/15]
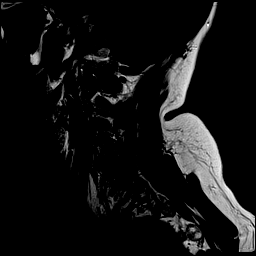

[Series 11: STIR · sagittal · 3.0mm · 0.62mm/px · 7 of 15 slices shown]
[im 1/15]
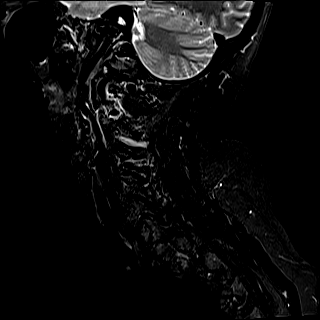
[im 3/15]
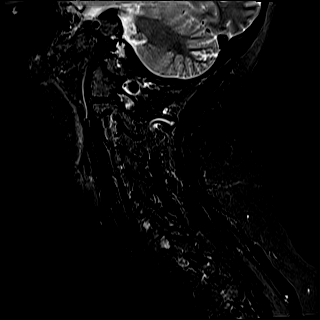
[im 5/15]
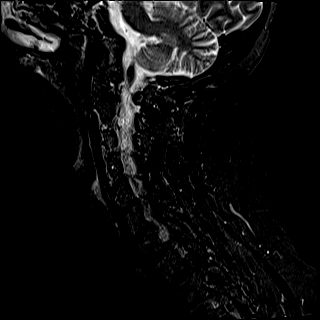
[im 8/15]
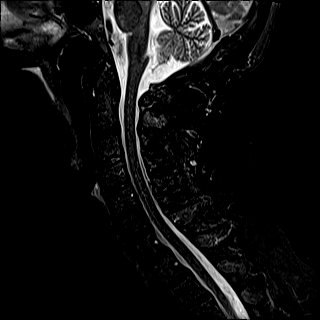
[im 10/15]
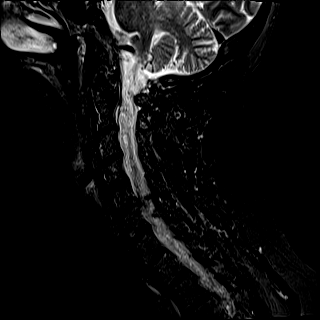
[im 12/15]
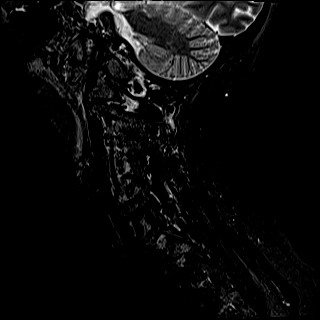
[im 15/15]
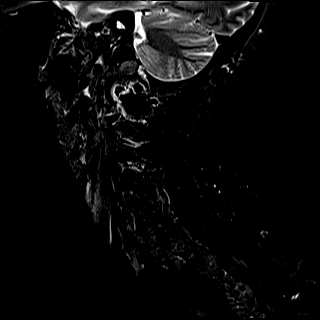

[Series 12: T2 · axial · 3.0mm · 0.70mm/px · z∈[-257,-163]mm · 11 of 29 slices shown (2 of 2)]
[im 1/29]
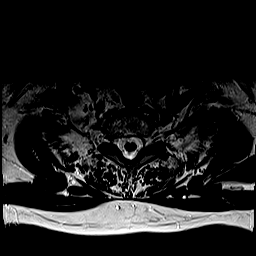
[im 3/29]
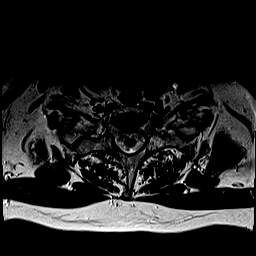
[im 5/29]
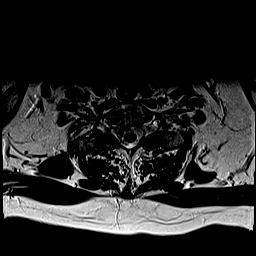
[im 7/29]
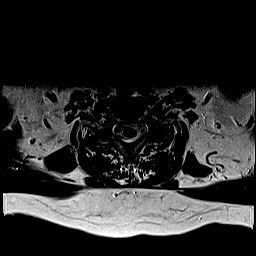
[im 9/29]
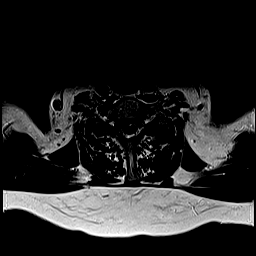
[im 11/29]
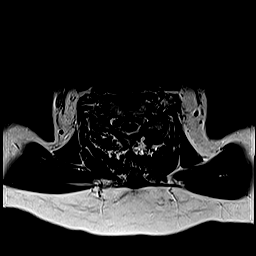
[im 13/29]
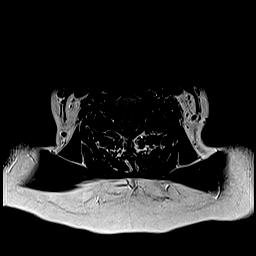
[im 16/29]
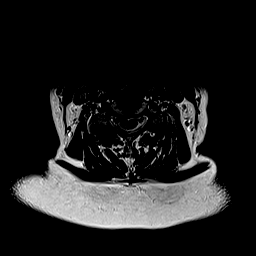
[im 20/29]
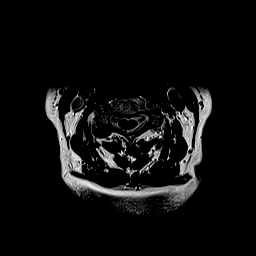
[im 24/29]
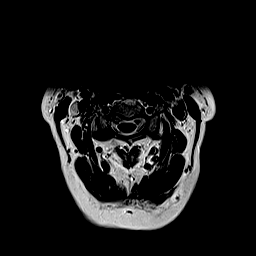
[im 29/29]
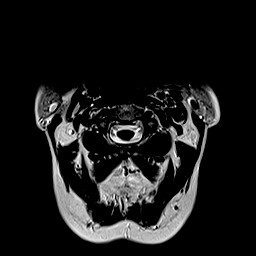

[Series 13: ax mpgr · axial · 3.0mm · 0.35mm/px · z∈[-257,-163]mm · 8 of 29 slices shown]
[im 1/29]
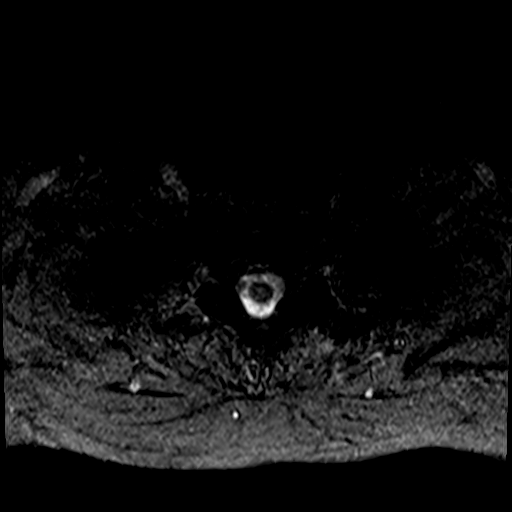
[im 5/29]
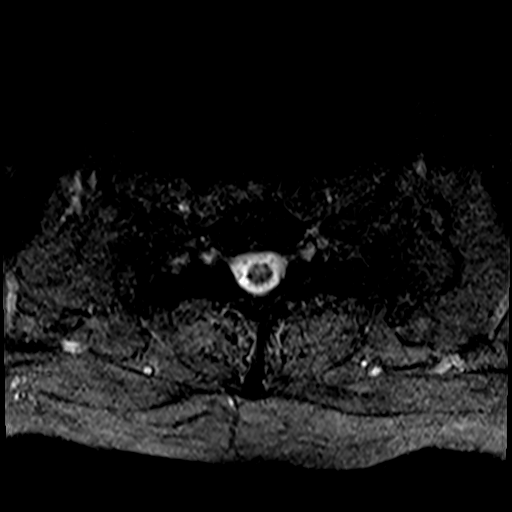
[im 9/29]
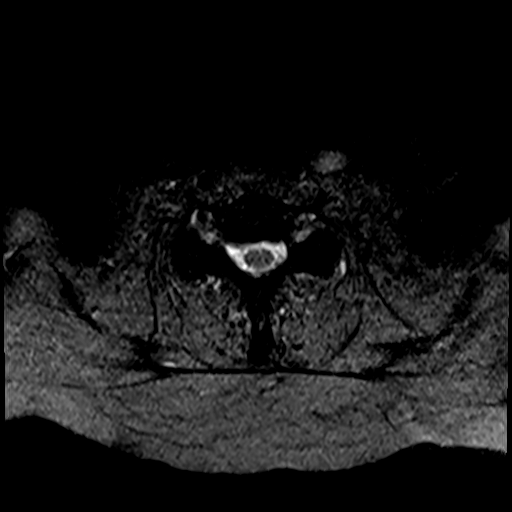
[im 13/29]
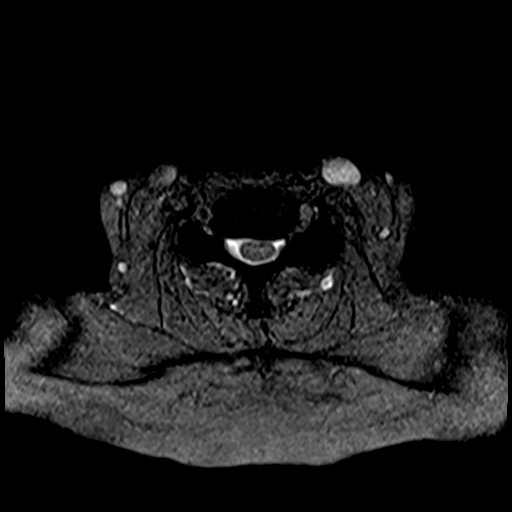
[im 16/29]
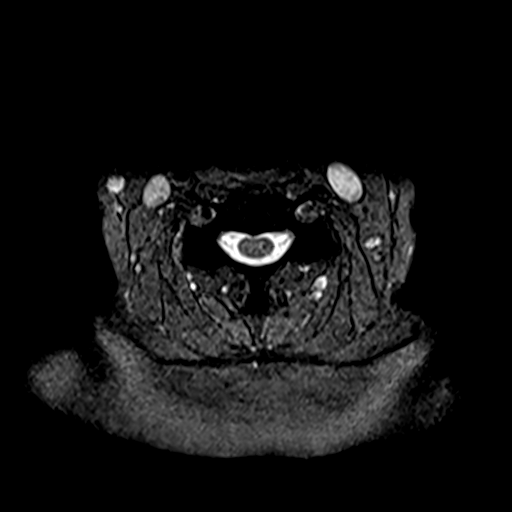
[im 20/29]
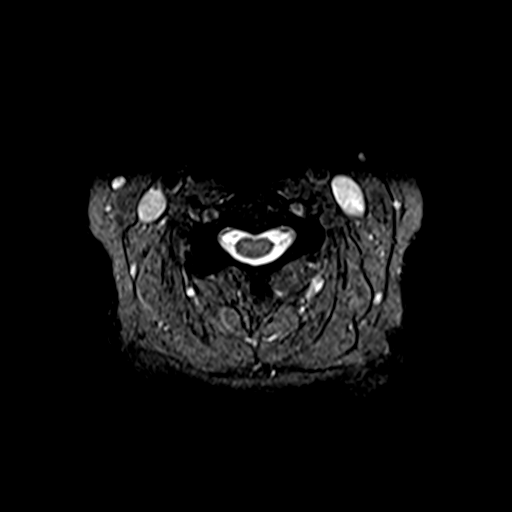
[im 24/29]
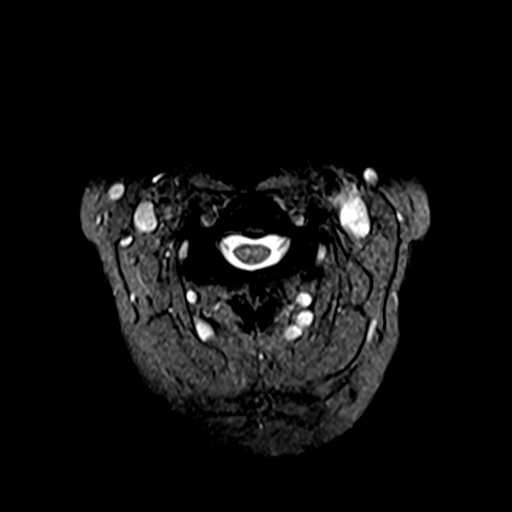
[im 29/29]
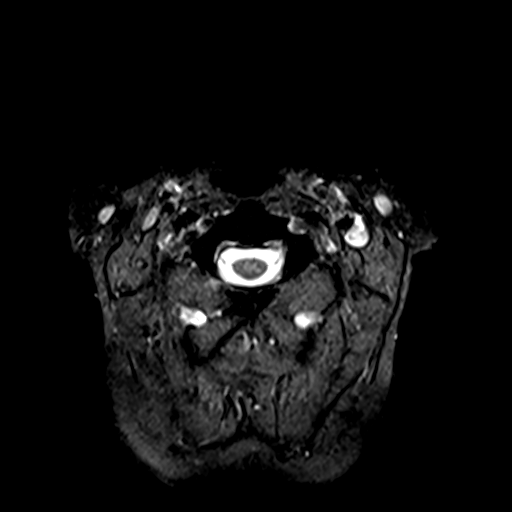

[39 of 48 positions shown; findings below may reference images not displayed]

FINDINGS: Alignment: Straightening of lordosis.

Vertebrae: Normal bone marrow signal intensity. Minimal edema
underlying the inferior C6 endplate, likely degenerative. No focal
osseous lesion.

Cord: Normal signal and morphology.

Posterior Fossa, vertebral arteries: Negative.

Disc levels: Multilevel desiccation however disc spaces remain
grossly preserved.

C2-3: No significant disc bulge, spinal canal or neural foraminal
narrowing.

C3-4: Uncovertebral and facet degenerative spurring. Patent spinal
canal and right neural foramen. Mild left neural foraminal
narrowing.

C4-5: Bilateral uncovertebral and facet hypertrophy. Mild left
neural foraminal narrowing. Patent spinal canal and right neural
foramen.

C5-6: Disc osteophyte complex with superimposed central protrusion
abutting the ventral cord. Uncovertebral and bilateral facet
hypertrophy. Mild spinal canal and moderate bilateral neural
foraminal narrowing.

C6-7: Disc osteophyte complex with superimposed right paracentral
protrusion, uncovertebral and bilateral facet degenerative spurring.
Patent spinal canal. Mild bilateral neural foraminal narrowing.

C7-T1: Bilateral facet degenerative spurring. Small right
paracentral protrusion. Patent spinal canal and neural foramen.

Paraspinal tissues: Negative.
IMPRESSION: Mild multilevel spondylosis.

Mild spinal canal and moderate bilateral neural foraminal narrowing
at the C5-6 level.

Mild left C3-5 and bilateral C6-7 neural foraminal narrowing.

## 2023-10-29 ENCOUNTER — Other Ambulatory Visit: Payer: Self-pay | Admitting: Internal Medicine

## 2023-10-29 DIAGNOSIS — R221 Localized swelling, mass and lump, neck: Secondary | ICD-10-CM

## 2023-10-29 DIAGNOSIS — D6859 Other primary thrombophilia: Secondary | ICD-10-CM

## 2023-11-06 ENCOUNTER — Ambulatory Visit
Admission: RE | Admit: 2023-11-06 | Discharge: 2023-11-06 | Disposition: A | Discharge status: Home / Self Care | Source: Ambulatory Visit | Attending: Internal Medicine | Admitting: Internal Medicine

## 2023-11-06 DIAGNOSIS — R221 Localized swelling, mass and lump, neck: Secondary | ICD-10-CM | POA: Diagnosis present

## 2023-11-06 DIAGNOSIS — D6859 Other primary thrombophilia: Secondary | ICD-10-CM | POA: Insufficient documentation

## 2023-11-06 MED ORDER — IOHEXOL 300 MG/ML  SOLN
75.0000 mL | Freq: Once | INTRAMUSCULAR | Status: AC | PRN
  Administered 2023-11-06: 75 mL via INTRAVENOUS

## 2023-11-28 ENCOUNTER — Other Ambulatory Visit: Payer: Self-pay | Admitting: Otolaryngology

## 2023-11-28 ENCOUNTER — Inpatient Hospital Stay
Admission: RE | Admit: 2023-11-28 | Discharge: 2023-11-28 | Disposition: A | Payer: Self-pay | Source: Ambulatory Visit | Attending: Otolaryngology | Admitting: Otolaryngology

## 2023-11-28 DIAGNOSIS — E041 Nontoxic single thyroid nodule: Secondary | ICD-10-CM

## 2023-12-03 ENCOUNTER — Other Ambulatory Visit: Payer: Self-pay | Admitting: Otolaryngology

## 2023-12-03 DIAGNOSIS — E041 Nontoxic single thyroid nodule: Secondary | ICD-10-CM

## 2023-12-05 NOTE — Progress Notes (Signed)
 Patient for US  guided FNA RT thyroid nodule biopsy on Friday 12/06/23, I called and lvm for the patient on the phone and gave pre-procedure instructions. VM made the patient aware to be here at 2p and check in at the [facility] registration desk.  Called 12/05/23

## 2023-12-06 ENCOUNTER — Ambulatory Visit
Admission: RE | Admit: 2023-12-06 | Discharge: 2023-12-06 | Disposition: A | Discharge status: Home / Self Care | Source: Ambulatory Visit | Attending: Otolaryngology | Admitting: Otolaryngology

## 2023-12-06 DIAGNOSIS — E042 Nontoxic multinodular goiter: Secondary | ICD-10-CM | POA: Insufficient documentation

## 2023-12-06 DIAGNOSIS — E041 Nontoxic single thyroid nodule: Secondary | ICD-10-CM

## 2023-12-06 MED ORDER — LIDOCAINE HCL (PF) 1 % IJ SOLN
10.0000 mL | Freq: Once | INTRAMUSCULAR | Status: AC
  Administered 2023-12-06: 10 mL via INTRADERMAL
  Filled 2023-12-06: qty 10

## 2023-12-06 NOTE — Procedures (Signed)
 Successful US  guided FNA of right mid thyroid nodule. Successful US  guided FNA of the mostly cystic isthmus thyroid nodule. No complications. See PACS for full report.  JOESPH VALDA BIRCH, PA-C 12/06/2023, 3:40 PM

## 2023-12-10 LAB — CYTOLOGY - NON PAP
